# Patient Record
Sex: Male | Born: 1935 | Race: White | Hispanic: No | Marital: Married | Smoking: Former smoker
Health system: Southern US, Community
[De-identification: ages and names within clinical notes are randomized; demographics above are authoritative.]

## PROBLEM LIST (undated history)

## (undated) DIAGNOSIS — G5710 Meralgia paresthetica, unspecified lower limb: Secondary | ICD-10-CM

## (undated) DIAGNOSIS — Z8551 Personal history of malignant neoplasm of bladder: Secondary | ICD-10-CM

## (undated) DIAGNOSIS — Z8619 Personal history of other infectious and parasitic diseases: Secondary | ICD-10-CM

## (undated) DIAGNOSIS — Z87438 Personal history of other diseases of male genital organs: Secondary | ICD-10-CM

## (undated) DIAGNOSIS — I10 Essential (primary) hypertension: Secondary | ICD-10-CM

## (undated) DIAGNOSIS — K5730 Diverticulosis of large intestine without perforation or abscess without bleeding: Secondary | ICD-10-CM

## (undated) DIAGNOSIS — R413 Other amnesia: Secondary | ICD-10-CM

## (undated) DIAGNOSIS — I219 Acute myocardial infarction, unspecified: Secondary | ICD-10-CM

## (undated) DIAGNOSIS — M436 Torticollis: Secondary | ICD-10-CM

## (undated) DIAGNOSIS — I723 Aneurysm of iliac artery: Secondary | ICD-10-CM

## (undated) DIAGNOSIS — K219 Gastro-esophageal reflux disease without esophagitis: Secondary | ICD-10-CM

## (undated) DIAGNOSIS — G250 Essential tremor: Secondary | ICD-10-CM

## (undated) DIAGNOSIS — E785 Hyperlipidemia, unspecified: Secondary | ICD-10-CM

## (undated) DIAGNOSIS — K635 Polyp of colon: Secondary | ICD-10-CM

## (undated) DIAGNOSIS — I2510 Atherosclerotic heart disease of native coronary artery without angina pectoris: Secondary | ICD-10-CM

## (undated) DIAGNOSIS — R531 Weakness: Secondary | ICD-10-CM

## (undated) HISTORY — PX: TONSILLECTOMY: SUR1361

## (undated) HISTORY — DX: Meralgia paresthetica, unspecified lower limb: G57.10

## (undated) HISTORY — DX: Hyperlipidemia, unspecified: E78.5

## (undated) HISTORY — PX: CATARACT EXTRACTION W/ INTRAOCULAR LENS  IMPLANT, BILATERAL: SHX1307

## (undated) HISTORY — DX: Essential (primary) hypertension: I10

## (undated) HISTORY — DX: Diverticulosis of large intestine without perforation or abscess without bleeding: K57.30

## (undated) HISTORY — DX: Polyp of colon: K63.5

## (undated) HISTORY — DX: Torticollis: M43.6

---

## 2000-11-13 ENCOUNTER — Encounter: Payer: Self-pay | Admitting: Gastroenterology

## 2004-06-22 ENCOUNTER — Ambulatory Visit: Payer: Self-pay | Admitting: Internal Medicine

## 2004-07-15 ENCOUNTER — Ambulatory Visit: Payer: Self-pay | Admitting: Internal Medicine

## 2004-11-09 ENCOUNTER — Ambulatory Visit: Payer: Self-pay | Admitting: Internal Medicine

## 2004-11-25 ENCOUNTER — Ambulatory Visit: Payer: Self-pay | Admitting: Internal Medicine

## 2005-01-25 ENCOUNTER — Ambulatory Visit: Payer: Self-pay | Admitting: Internal Medicine

## 2005-08-21 HISTORY — PX: COLONOSCOPY: SHX174

## 2005-09-05 ENCOUNTER — Ambulatory Visit: Payer: Self-pay | Admitting: Internal Medicine

## 2005-09-07 ENCOUNTER — Ambulatory Visit: Payer: Self-pay | Admitting: Internal Medicine

## 2005-09-07 ENCOUNTER — Inpatient Hospital Stay (HOSPITAL_COMMUNITY): Admission: EM | Admit: 2005-09-07 | Discharge: 2005-09-10 | Payer: Self-pay | Admitting: Emergency Medicine

## 2005-09-14 ENCOUNTER — Ambulatory Visit: Payer: Self-pay | Admitting: Internal Medicine

## 2005-10-02 ENCOUNTER — Ambulatory Visit: Payer: Self-pay | Admitting: Internal Medicine

## 2005-10-20 ENCOUNTER — Ambulatory Visit: Payer: Self-pay | Admitting: Gastroenterology

## 2005-11-03 ENCOUNTER — Ambulatory Visit: Payer: Self-pay | Admitting: Gastroenterology

## 2005-11-03 ENCOUNTER — Encounter (INDEPENDENT_AMBULATORY_CARE_PROVIDER_SITE_OTHER): Payer: Self-pay | Admitting: *Deleted

## 2005-11-03 DIAGNOSIS — D126 Benign neoplasm of colon, unspecified: Secondary | ICD-10-CM

## 2006-02-08 ENCOUNTER — Ambulatory Visit: Payer: Self-pay | Admitting: Internal Medicine

## 2006-02-27 ENCOUNTER — Ambulatory Visit: Payer: Self-pay | Admitting: Internal Medicine

## 2006-05-09 ENCOUNTER — Ambulatory Visit: Payer: Self-pay | Admitting: Internal Medicine

## 2006-09-19 ENCOUNTER — Ambulatory Visit: Payer: Self-pay | Admitting: Internal Medicine

## 2006-10-07 DIAGNOSIS — E785 Hyperlipidemia, unspecified: Secondary | ICD-10-CM | POA: Insufficient documentation

## 2006-10-07 DIAGNOSIS — I10 Essential (primary) hypertension: Secondary | ICD-10-CM | POA: Insufficient documentation

## 2006-10-07 DIAGNOSIS — K5730 Diverticulosis of large intestine without perforation or abscess without bleeding: Secondary | ICD-10-CM | POA: Insufficient documentation

## 2006-11-05 ENCOUNTER — Ambulatory Visit: Payer: Self-pay | Admitting: Internal Medicine

## 2006-11-05 LAB — CONVERTED CEMR LAB
HDL: 42.6 mg/dL (ref >39.0)
Total CHOL/HDL Ratio: 3.1

## 2007-01-15 ENCOUNTER — Encounter: Payer: Self-pay | Admitting: Internal Medicine

## 2007-01-15 ENCOUNTER — Ambulatory Visit: Payer: Self-pay | Admitting: Internal Medicine

## 2007-01-15 LAB — CONVERTED CEMR LAB: Hemoglobin: 17.2 g/dL

## 2007-01-17 ENCOUNTER — Ambulatory Visit: Payer: Self-pay | Admitting: Gastroenterology

## 2007-02-07 ENCOUNTER — Telehealth (INDEPENDENT_AMBULATORY_CARE_PROVIDER_SITE_OTHER): Payer: Self-pay | Admitting: *Deleted

## 2007-05-28 ENCOUNTER — Telehealth (INDEPENDENT_AMBULATORY_CARE_PROVIDER_SITE_OTHER): Payer: Self-pay | Admitting: *Deleted

## 2007-06-04 ENCOUNTER — Ambulatory Visit: Payer: Self-pay | Admitting: Internal Medicine

## 2007-06-04 DIAGNOSIS — B3749 Other urogenital candidiasis: Secondary | ICD-10-CM | POA: Insufficient documentation

## 2007-06-26 ENCOUNTER — Inpatient Hospital Stay (HOSPITAL_COMMUNITY): Admission: EM | Admit: 2007-06-26 | Discharge: 2007-06-30 | Payer: Self-pay | Admitting: Emergency Medicine

## 2007-06-26 ENCOUNTER — Ambulatory Visit: Payer: Self-pay | Admitting: Vascular Surgery

## 2007-06-27 ENCOUNTER — Encounter: Payer: Self-pay | Admitting: Internal Medicine

## 2007-06-27 ENCOUNTER — Encounter (INDEPENDENT_AMBULATORY_CARE_PROVIDER_SITE_OTHER): Payer: Self-pay | Admitting: General Surgery

## 2007-06-27 HISTORY — PX: LAPAROSCOPIC CHOLECYSTECTOMY: SUR755

## 2007-06-28 ENCOUNTER — Ambulatory Visit: Payer: Self-pay | Admitting: Internal Medicine

## 2007-07-09 ENCOUNTER — Ambulatory Visit: Payer: Self-pay | Admitting: Internal Medicine

## 2007-08-02 ENCOUNTER — Encounter: Payer: Self-pay | Admitting: Internal Medicine

## 2007-08-12 ENCOUNTER — Ambulatory Visit: Payer: Self-pay | Admitting: Internal Medicine

## 2007-08-12 DIAGNOSIS — G250 Essential tremor: Secondary | ICD-10-CM | POA: Insufficient documentation

## 2007-08-12 DIAGNOSIS — G252 Other specified forms of tremor: Secondary | ICD-10-CM

## 2007-08-12 DIAGNOSIS — R413 Other amnesia: Secondary | ICD-10-CM | POA: Insufficient documentation

## 2007-08-12 LAB — CONVERTED CEMR LAB

## 2007-08-19 ENCOUNTER — Encounter (INDEPENDENT_AMBULATORY_CARE_PROVIDER_SITE_OTHER): Payer: Self-pay | Admitting: *Deleted

## 2007-09-24 ENCOUNTER — Encounter: Payer: Self-pay | Admitting: Internal Medicine

## 2007-09-24 ENCOUNTER — Ambulatory Visit: Payer: Self-pay | Admitting: Vascular Surgery

## 2008-02-04 ENCOUNTER — Ambulatory Visit: Payer: Self-pay | Admitting: Internal Medicine

## 2008-02-18 ENCOUNTER — Encounter (INDEPENDENT_AMBULATORY_CARE_PROVIDER_SITE_OTHER): Payer: Self-pay | Admitting: *Deleted

## 2008-02-18 LAB — CONVERTED CEMR LAB: Vitamin B-12: 617 pg/mL (ref 211–911)

## 2008-03-16 ENCOUNTER — Ambulatory Visit: Payer: Self-pay | Admitting: Internal Medicine

## 2008-03-16 ENCOUNTER — Encounter (INDEPENDENT_AMBULATORY_CARE_PROVIDER_SITE_OTHER): Payer: Self-pay | Admitting: *Deleted

## 2008-03-23 ENCOUNTER — Encounter (INDEPENDENT_AMBULATORY_CARE_PROVIDER_SITE_OTHER): Payer: Self-pay | Admitting: *Deleted

## 2008-03-24 ENCOUNTER — Encounter: Payer: Self-pay | Admitting: Internal Medicine

## 2008-03-24 ENCOUNTER — Ambulatory Visit: Payer: Self-pay

## 2008-03-27 ENCOUNTER — Encounter (INDEPENDENT_AMBULATORY_CARE_PROVIDER_SITE_OTHER): Payer: Self-pay | Admitting: *Deleted

## 2008-03-31 ENCOUNTER — Encounter: Payer: Self-pay | Admitting: Internal Medicine

## 2008-03-31 ENCOUNTER — Ambulatory Visit: Payer: Self-pay | Admitting: Vascular Surgery

## 2008-03-31 ENCOUNTER — Telehealth (INDEPENDENT_AMBULATORY_CARE_PROVIDER_SITE_OTHER): Payer: Self-pay | Admitting: *Deleted

## 2008-03-31 DIAGNOSIS — I6529 Occlusion and stenosis of unspecified carotid artery: Secondary | ICD-10-CM

## 2008-04-20 ENCOUNTER — Encounter: Payer: Self-pay | Admitting: Internal Medicine

## 2008-06-18 ENCOUNTER — Telehealth (INDEPENDENT_AMBULATORY_CARE_PROVIDER_SITE_OTHER): Payer: Self-pay | Admitting: *Deleted

## 2008-09-29 ENCOUNTER — Ambulatory Visit: Payer: Self-pay

## 2008-09-29 ENCOUNTER — Encounter: Payer: Self-pay | Admitting: Internal Medicine

## 2008-10-06 ENCOUNTER — Ambulatory Visit: Payer: Self-pay | Admitting: Internal Medicine

## 2008-10-06 DIAGNOSIS — K219 Gastro-esophageal reflux disease without esophagitis: Secondary | ICD-10-CM

## 2008-10-06 DIAGNOSIS — J309 Allergic rhinitis, unspecified: Secondary | ICD-10-CM | POA: Insufficient documentation

## 2008-10-22 ENCOUNTER — Telehealth (INDEPENDENT_AMBULATORY_CARE_PROVIDER_SITE_OTHER): Payer: Self-pay | Admitting: *Deleted

## 2008-10-27 ENCOUNTER — Telehealth (INDEPENDENT_AMBULATORY_CARE_PROVIDER_SITE_OTHER): Payer: Self-pay | Admitting: *Deleted

## 2008-11-04 ENCOUNTER — Ambulatory Visit: Payer: Self-pay | Admitting: Internal Medicine

## 2008-11-16 ENCOUNTER — Encounter (INDEPENDENT_AMBULATORY_CARE_PROVIDER_SITE_OTHER): Payer: Self-pay | Admitting: *Deleted

## 2008-11-16 LAB — CONVERTED CEMR LAB
AST: 22 units/L (ref 0–37)
Bilirubin, Direct: 0 mg/dL (ref 0.0–0.3)
Glucose, Bld: 109 mg/dL — ABNORMAL HIGH (ref 70–99)
Triglycerides: 121 mg/dL (ref 0.0–149.0)

## 2008-11-24 ENCOUNTER — Ambulatory Visit: Payer: Self-pay | Admitting: Internal Medicine

## 2008-11-24 DIAGNOSIS — R7309 Other abnormal glucose: Secondary | ICD-10-CM

## 2008-12-24 ENCOUNTER — Encounter: Payer: Self-pay | Admitting: Internal Medicine

## 2009-01-20 ENCOUNTER — Telehealth: Payer: Self-pay | Admitting: Internal Medicine

## 2009-02-17 ENCOUNTER — Telehealth (INDEPENDENT_AMBULATORY_CARE_PROVIDER_SITE_OTHER): Payer: Self-pay | Admitting: *Deleted

## 2009-02-24 ENCOUNTER — Ambulatory Visit: Payer: Self-pay | Admitting: Internal Medicine

## 2009-02-28 ENCOUNTER — Emergency Department (HOSPITAL_COMMUNITY): Admission: EM | Admit: 2009-02-28 | Discharge: 2009-02-28 | Payer: Self-pay | Admitting: Emergency Medicine

## 2009-03-07 LAB — CONVERTED CEMR LAB
ALT: 11 units/L (ref 0–53)
AST: 24 units/L (ref 0–37)
BUN: 13 mg/dL (ref 6–23)
Cholesterol: 145 mg/dL (ref 0–200)
Creatinine, Ser: 1.1 mg/dL (ref 0.4–1.5)
HDL: 40.9 mg/dL (ref >39.00)
Triglycerides: 112 mg/dL (ref 0.0–149.0)
VLDL: 22.4 mg/dL (ref 0.0–40.0)

## 2009-03-08 ENCOUNTER — Encounter (INDEPENDENT_AMBULATORY_CARE_PROVIDER_SITE_OTHER): Payer: Self-pay | Admitting: *Deleted

## 2009-03-09 ENCOUNTER — Telehealth (INDEPENDENT_AMBULATORY_CARE_PROVIDER_SITE_OTHER): Payer: Self-pay | Admitting: *Deleted

## 2009-03-25 ENCOUNTER — Telehealth (INDEPENDENT_AMBULATORY_CARE_PROVIDER_SITE_OTHER): Payer: Self-pay | Admitting: *Deleted

## 2009-04-02 ENCOUNTER — Ambulatory Visit: Payer: Self-pay | Admitting: Cardiovascular Disease

## 2009-06-03 ENCOUNTER — Ambulatory Visit: Payer: Self-pay | Admitting: Internal Medicine

## 2009-06-03 ENCOUNTER — Encounter: Payer: Self-pay | Admitting: Family Medicine

## 2009-06-03 DIAGNOSIS — N401 Enlarged prostate with lower urinary tract symptoms: Secondary | ICD-10-CM

## 2009-06-03 LAB — CONVERTED CEMR LAB
Bilirubin Urine: NEGATIVE
Glucose, Urine, Semiquant: NEGATIVE
Ketones, urine, test strip: NEGATIVE
pH: 6

## 2009-06-08 ENCOUNTER — Encounter: Payer: Self-pay | Admitting: Family Medicine

## 2009-10-27 ENCOUNTER — Ambulatory Visit: Payer: Self-pay | Admitting: Internal Medicine

## 2009-10-27 DIAGNOSIS — H8110 Benign paroxysmal vertigo, unspecified ear: Secondary | ICD-10-CM | POA: Insufficient documentation

## 2009-10-27 DIAGNOSIS — M436 Torticollis: Secondary | ICD-10-CM | POA: Insufficient documentation

## 2009-10-27 DIAGNOSIS — I951 Orthostatic hypotension: Secondary | ICD-10-CM | POA: Insufficient documentation

## 2009-10-27 LAB — CONVERTED CEMR LAB
Eosinophils Absolute: 0.1 10*3/uL (ref 0.0–0.7)
MCHC: 32.8 g/dL (ref 30.0–36.0)
MCV: 100.1 fL — ABNORMAL HIGH (ref 78.0–100.0)
Monocytes Relative: 7.7 % (ref 3.0–12.0)
Neutro Abs: 3.7 10*3/uL (ref 1.4–7.7)
Neutrophils Relative %: 50.3 % (ref 43.0–77.0)
Platelets: 206 10*3/uL (ref 150.0–400.0)
RBC: 5.02 M/uL (ref 4.22–5.81)
RDW: 13.2 % (ref 11.5–14.6)

## 2009-11-25 ENCOUNTER — Encounter: Payer: Self-pay | Admitting: Internal Medicine

## 2009-12-04 ENCOUNTER — Emergency Department (HOSPITAL_COMMUNITY): Admission: EM | Admit: 2009-12-04 | Discharge: 2009-12-05 | Payer: Self-pay | Admitting: Emergency Medicine

## 2009-12-06 ENCOUNTER — Telehealth: Payer: Self-pay | Admitting: Internal Medicine

## 2009-12-16 ENCOUNTER — Encounter: Payer: Self-pay | Admitting: Internal Medicine

## 2009-12-30 ENCOUNTER — Ambulatory Visit: Payer: Self-pay | Admitting: Internal Medicine

## 2009-12-30 DIAGNOSIS — R350 Frequency of micturition: Secondary | ICD-10-CM

## 2009-12-30 LAB — CONVERTED CEMR LAB
Nitrite: NEGATIVE
Protein, U semiquant: NEGATIVE
Urobilinogen, UA: 0.2

## 2009-12-31 ENCOUNTER — Encounter: Payer: Self-pay | Admitting: Internal Medicine

## 2010-01-27 ENCOUNTER — Telehealth (INDEPENDENT_AMBULATORY_CARE_PROVIDER_SITE_OTHER): Payer: Self-pay | Admitting: *Deleted

## 2010-03-22 ENCOUNTER — Ambulatory Visit: Payer: Self-pay | Admitting: Internal Medicine

## 2010-03-22 DIAGNOSIS — R002 Palpitations: Secondary | ICD-10-CM

## 2010-03-22 DIAGNOSIS — R609 Edema, unspecified: Secondary | ICD-10-CM

## 2010-04-17 ENCOUNTER — Emergency Department (HOSPITAL_COMMUNITY): Admission: EM | Admit: 2010-04-17 | Discharge: 2010-04-17 | Payer: Self-pay | Admitting: Emergency Medicine

## 2010-04-19 ENCOUNTER — Encounter: Payer: Self-pay | Admitting: Internal Medicine

## 2010-05-06 ENCOUNTER — Encounter: Payer: Self-pay | Admitting: Internal Medicine

## 2010-05-24 ENCOUNTER — Telehealth (INDEPENDENT_AMBULATORY_CARE_PROVIDER_SITE_OTHER): Payer: Self-pay | Admitting: *Deleted

## 2010-07-12 ENCOUNTER — Encounter: Payer: Self-pay | Admitting: Internal Medicine

## 2010-07-13 ENCOUNTER — Ambulatory Visit: Payer: Self-pay | Admitting: Internal Medicine

## 2010-07-13 DIAGNOSIS — R1312 Dysphagia, oropharyngeal phase: Secondary | ICD-10-CM

## 2010-07-13 DIAGNOSIS — J029 Acute pharyngitis, unspecified: Secondary | ICD-10-CM

## 2010-07-18 LAB — CONVERTED CEMR LAB: PSA: 0.13 ng/mL (ref <=4.00)

## 2010-08-03 ENCOUNTER — Encounter: Payer: Self-pay | Admitting: Internal Medicine

## 2010-09-18 LAB — CONVERTED CEMR LAB
BUN: 10 mg/dL (ref 6–23)
BUN: 11 mg/dL (ref 6–23)
Basophils Absolute: 0 10*3/uL (ref 0.0–0.1)
Basophils Relative: 0.7 % (ref 0.0–3.0)
Calcium: 9.4 mg/dL (ref 8.4–10.5)
Creatinine, Ser: 1 mg/dL (ref 0.4–1.5)
Eosinophils Relative: 4.5 % (ref 0.0–5.0)
HCT: 48.9 % (ref 39.0–52.0)
HCT: 49.6 % (ref 39.0–52.0)
Lymphocytes Relative: 34.8 % (ref 12.0–46.0)
Lymphs Abs: 3.2 10*3/uL (ref 0.7–4.0)
MCHC: 33.7 g/dL (ref 30.0–36.0)
MCV: 96.9 fL (ref 78.0–100.0)
MCV: 98 fL (ref 78.0–100.0)
Monocytes Absolute: 1.3 10*3/uL — ABNORMAL HIGH (ref 0.1–1.0)
Monocytes Relative: 18.5 % — ABNORMAL HIGH (ref 3.0–12.0)
Neutro Abs: 1.6 10*3/uL (ref 1.4–7.7)
Platelets: 207 10*3/uL (ref 150–400)
Platelets: 215 10*3/uL (ref 150.0–400.0)
Potassium: 3.9 meq/L (ref 3.5–5.1)
Potassium: 4.6 meq/L (ref 3.5–5.1)
RBC: 4.99 M/uL (ref 4.22–5.81)
RDW: 13.9 % (ref 11.5–14.6)
WBC: 9.3 10*3/uL (ref 4.5–10.5)

## 2010-09-20 NOTE — Assessment & Plan Note (Signed)
Summary: sinus - blood when blowing nose/cbs   Vital Signs:  Patient profile:   77 year old male Weight:      202.8 pounds Temp:     98.5 degrees F oral Pulse rate:   72 / minute Resp:     17 per minute BP sitting:   124 / 70  (left arm) Cuff size:   large  Vitals Entered By: Shonna Chock CMA (March 22, 2010 12:01 PM) CC: 1.) Blood when blowing nose x 2 weeks (mostly in am)  2.) Lower leg swelling  3.) Funny felling in chest x 2, Palpitations   CC:  1.) Blood when blowing nose x 2 weeks (mostly in am)  2.) Lower leg swelling  3.) Funny felling in chest x 2 and Palpitations.  History of Present Illness:  Palpitations      This is an 77 year old man who presents with palpitations twice in past month.  The patient denies dizziness, presyncope, syncope, chest pain, and shortness of breath.  The patient denies the following symptoms: blurred vision, numbness, weakness, diaphoresis, nausea, weight loss, and heat intolerance.  The palpitations are described as a sensation of rapid heart fluttering.  The palpitations are sudden in onset, intermittent,  occur at rest  at night, "when I had restles night".He d                                                       Blood from nose  , "emptying out from L side" intermittently, "most every morning X 2 weeks".dr Sandria Manly Rxed plavix.  Current Medications (verified): 1)  Diovan  320 Mg  Tabs (Valsartan) .... 1/2  By Mouth Once Daily 2)  Metoprolol Tartrate 50 Mg  Tabs (Metoprolol Tartrate) .... 1/2 Tab Bid 3)  Tylenol .Marland Kitchen.. 1 By Mouth Prn 4)  Plavix 75 Mg Tabs (Clopidogrel Bisulfate) .Marland Kitchen.. 1 By Mouth Once Daily 5)  Fish Oil 1200 Mg Caps (Omega-3 Fatty Acids) .... Two Times A Day 6)  Fiber .... Two Times A Day 7)  Ranitidine Hcl 150 Mg Tabs (Ranitidine Hcl) .Marland Kitchen.. 1 Two Times A Day Pre Meals 8)  Pravastatin Sodium 40 Mg Tabs (Pravastatin Sodium) .Marland Kitchen.. 1 At Bedtime 9)  Finasteride 5 Mg Tabs (Finasteride) .Marland Kitchen.. 1 Once Daily 10)  Tamsulosin Hcl 0.4 Mg Caps  (Tamsulosin Hcl) .Marland Kitchen.. 1 Once Daily ; Monitor Bp Closely  Allergies: 1)  ! * Altace 2)  ! * Hctz 3)  ! * Ranitidine 4)  ! * Saw Palmetto 5)  ! Flomax 6)  ! * Neurotin 7)  ! * Spe  Review of Systems General:  Denies chills, fever, and sweats. ENT:  No facial pain , frontal headache or purulence. CV:  Complains of swelling of feet; denies difficulty breathing at night, difficulty breathing while lying down, and swelling of hands; Edema resolves overnight. Resp:  Complains of cough; denies chest pain with inspiration and coughing up blood. GI:  Denies bloody stools and dark tarry stools. GU:  Denies hematuria. Heme:  Denies abnormal bruising.  Physical Exam  General:  Appears younger than age,in no acute distress; alert,appropriate and cooperative throughout examination Eyes:  No lid laf Ears:  External ear exam shows no significant lesions or deformities.  Otoscopic examination reveals clear canals, tympanic membranes are intact bilaterally without bulging,  retraction, inflammation or discharge. Hearing is grossly normal bilaterally. Nose:  External nasal examination shows no deformity or inflammation. Nasal mucosa are  dry without lesions or exudates. Mouth:  Oral mucosa and oropharynx without lesions or exudates.  Teeth in good repair. Neck:  No deformities, masses, or tenderness noted. Lungs:  Normal respiratory effort, chest expands symmetrically. Lungs are clear to auscultation, no crackles or wheezes. Heart:  regular rhythm, no murmur, no gallop, no rub, no JVD, and bradycardia.   Extremities:  No clubbing, cyanosis.trace left pedal edema and trace right pedal edema.   Neurologic:  alert & oriented X3 and DTRs symmetrical and normal.   fine tremor LUE > RUE  Skin:  Intact without suspicious lesions or rashes Cervical Nodes:  No lymphadenopathy noted Axillary Nodes:  No palpable lymphadenopathy Psych:  depressed affect and flat affect. Somewhat argumentative ("It ain't nosebleed,  it's coming fom my sinuses")     Impression & Recommendations:  Problem # 1:  PALPITATIONS (ICD-785.1)  His updated medication list for this problem includes:    Metoprolol Tartrate 50 Mg Tabs (Metoprolol tartrate) .Marland Kitchen... 1/2 tab bid  Orders: Venipuncture (52841) TLB-TSH (Thyroid Stimulating Hormone) (84443-TSH) TLB-Potassium (K+) (84132-K) TLB-Calcium (82310-CA) TLB-Magnesium (Mg) (83735-MG) EKG w/ Interpretation (93000)  Problem # 2:  EPISTAXIS (ICD-784.7)  probably from drying & Plavix  Orders: Venipuncture (32440) TLB-CBC Platelet - w/Differential (85025-CBCD)  Problem # 3:  EDEMA- LOCALIZED (ICD-782.3)  trace  Orders: TLB-Creatinine, Blood (82565-CREA) TLB-BUN (Urea Nitrogen) (84520-BUN)  Complete Medication List: 1)  Diovan 320 Mg Tabs (valsartan)  .... 1/2  by mouth once daily 2)  Metoprolol Tartrate 50 Mg Tabs (Metoprolol tartrate) .... 1/2 tab bid 3)  Tylenol  .Marland Kitchen.. 1 by mouth prn 4)  Plavix 75 Mg Tabs (Clopidogrel bisulfate) .Marland Kitchen.. 1 by mouth once daily 5)  Fish Oil 1200 Mg Caps (Omega-3 fatty acids) .... Two times a day 6)  Fiber  .... Two times a day 7)  Ranitidine Hcl 150 Mg Tabs (Ranitidine hcl) .Marland Kitchen.. 1 two times a day pre meals 8)  Pravastatin Sodium 40 Mg Tabs (Pravastatin sodium) .Marland Kitchen.. 1 at bedtime 9)  Finasteride 5 Mg Tabs (Finasteride) .Marland Kitchen.. 1 once daily 10)  Tamsulosin Hcl 0.4 Mg Caps (Tamsulosin hcl) .Marland Kitchen.. 1 once daily ; monitor bp closely  Patient Instructions: 1)  Eucerin topically two times a day to nasal septum . If bleeding continues , ENT referral indicated.Avoid stimulantsas discussed. 2)  Limit your Sodium (Salt) to less than 4 grams a day (slightly less than 1 teaspoon) to prevent fluid retention, swelling, or worsening or symptoms.

## 2010-09-20 NOTE — Letter (Signed)
Summary: [facility]  [facility]   Imported By: Lanelle Bal 05/16/2010 12:32:45  _____________________________________________________________________  External Attachment:    Type:   Image     Comment:   External Document

## 2010-09-20 NOTE — Progress Notes (Signed)
Summary: finasteride refill   Phone Note Refill Request Message from:  Fax from Pharmacy on May 24, 2010 8:56 AM  Refills Requested: Medication #1:  FINASTERIDE 5 MG TABS 1 once daily Ivar Bury - fax [phone]  Initial call taken by: Okey Regal [place],  May 24, 2010 8:56 AM    Prescriptions: FINASTERIDE 5 MG TABS (FINASTERIDE) 1 once daily  #30 Tablet x 4   Entered by:   Doristine Devoid CMA   Authorized by:   Marga Melnick MD   Signed by:   Doristine Devoid CMA on 05/24/2010   Method used:   [facility]        [phone][facility] (retail[facility]       [address]       [place], [place]  [address]       Ph: [phone]       Fax: [phone]   RxID:   [id number]

## 2010-09-20 NOTE — Letter (Signed)
Summary: [facility]  [facility]   Imported By: Lanelle Bal 05/03/2010 09:31:10  _____________________________________________________________________  External Attachment:    Type:   Image     Comment:   External Document

## 2010-09-20 NOTE — Assessment & Plan Note (Signed)
Summary: sore throat///sph   Vital Signs:  Patient profile:   77 year old male Weight:      202.4 pounds Temp:     98.7 degrees F oral Pulse rate:   72 / minute Resp:     16 per minute BP sitting:   118 / 64  (left arm) Cuff size:   large  Vitals Entered By: Shonna Chock CMA (July 13, 2010 2:01 PM) CC: Patient had a sore throat, onset last Friday- sore throat resolved. Patient dry month x long time, patient also needs ordeer for labs (near future) , URI symptoms   CC:  Patient had a sore throat, onset last Friday- sore throat resolved. Patient dry month x long time, patient also needs ordeer for labs (near future) , and URI symptoms.  History of Present Illness:      This is an 77 year old man who presents with URI symptoms as ST 11/18- 11/22. This resolved with OTC drops. The patient reports sore throat, but denies nasal congestion, purulent nasal discharge, productive cough, and earache.  The patient denies fever, dyspnea, and wheezing.  The patient denies headache or bilateral facial pain, tooth pain, and tender adenopathy.  Some dysphagia  when  collection of pills  taken or with mashed potatoes. He has chronically dry mouth , but not xerostomia.  Current Medications (verified): 1)  Diovan  320 Mg  Tabs (Valsartan) .... 1/2  By Mouth Once Daily 2)  Metoprolol Tartrate 50 Mg  Tabs (Metoprolol Tartrate) .... 1/2 Tab Bid 3)  Tylenol .Marland Kitchen.. 1 By Mouth Prn 4)  Fish Oil 1200 Mg Caps (Omega-3 Fatty Acids) .... Two Times A Day 5)  Fiber .... Two Times A Day 6)  Ranitidine Hcl 150 Mg Tabs (Ranitidine Hcl) .Marland Kitchen.. 1 Two Times A Day Pre Meals 7)  Pravastatin Sodium 40 Mg Tabs (Pravastatin Sodium) .Marland Kitchen.. 1 At Bedtime **labs Due** 8)  Finasteride 5 Mg Tabs (Finasteride) .Marland Kitchen.. 1 Once Daily  Allergies: 1)  ! * Altace 2)  ! * Hctz 3)  ! * Ranitidine 4)  ! * Saw Palmetto 5)  ! Flomax 6)  ! * Neurotin 7)  ! * Spe  Review of Systems Allergy:  Denies itching eyes and sneezing; No angioedema  .  Physical Exam  General:  in no acute distress; alert,appropriate and cooperative throughout examination Ears:  External ear exam shows no significant lesions or deformities.  Otoscopic examination reveals clear canals, tympanic membranes are intact bilaterally without bulging, retraction, inflammation or discharge. Hearing is grossly normal bilaterally. Nose:  External nasal examination shows no deformity or inflammation. Nasal mucosa are pink and moist without lesions or exudates. Mouth:  Oral mucosa and oropharynx without lesions or exudates.  Teeth in good repair.Tongue not dry Lungs:  Normal respiratory effort, chest expands symmetrically. Lungs are clear to auscultation, no crackles or wheezes. Cervical Nodes:  No lymphadenopathy noted Axillary Nodes:  No palpable lymphadenopathy   Impression & Recommendations:  Problem # 1:  SORE THROAT (ICD-462) resolved, ? related to ERD  Problem # 2:  DYSPHAGIA, OROPHARYNGEAL PHASE (ZOX-096.04) especially with large # of pills  Problem # 3:  HYPERTROPHY PROSTATE W/UR OBST & OTH LUTS (ICD-600.01)  The following medications were removed from the medication list:    Tamsulosin Hcl 0.4 Mg Caps (Tamsulosin hcl) .Marland Kitchen... 1 once daily ; monitor bp closely His updated medication list for this problem includes:    Finasteride 5 Mg Tabs (Finasteride) .Marland Kitchen... 1 once daily  Orders:  Venipuncture (84132) TLB-PSA (Prostate Specific Antigen) (84153-PSA)  Complete Medication List: 1)  Diovan 320 Mg Tabs (valsartan)  .... 1/2  by mouth once daily 2)  Metoprolol Tartrate 50 Mg Tabs (Metoprolol tartrate) .... 1/2 tab bid 3)  Tylenol  .Marland Kitchen.. 1 by mouth prn 4)  Fish Oil 1200 Mg Caps (Omega-3 fatty acids) .... Two times a day 5)  Fiber  .... Two times a day 6)  Ranitidine Hcl 150 Mg Tabs (Ranitidine hcl) .Marland Kitchen.. 1 two times a day pre meals 7)  Pravastatin Sodium 40 Mg Tabs (Pravastatin sodium) .Marland Kitchen.. 1 at bedtime 8)  Finasteride 5 Mg Tabs (Finasteride) .Marland Kitchen.. 1 once  daily  Patient Instructions: 1)  Trial of Omeprazole ( Prilosec OTC)  in place of two times a day Ranitidine.  2)  Avoid foods high in acid (tomatoes, citrus juices, spicy foods). Avoid eating within two hours of lying down or before exercising. Do not over eat; try smaller more frequent meals. Elevate head of bed twelve inches when sleeping. Divide pills into 2 separate batches / day or take them 1 pill @ a time. Prescriptions: PRAVASTATIN SODIUM 40 MG TABS (PRAVASTATIN SODIUM) 1 at bedtime  #90 x 1   Entered and Authorized by:   Marga Melnick MD   Signed by:   Marga Melnick MD on 07/13/2010   Method used:   Print then Give to Patient   RxID:   [id number] FINASTERIDE 5 MG TABS (FINASTERIDE) 1 once daily  #90 x 1   Entered and Authorized by:   Marga Melnick MD   Signed by:   Marga Melnick MD on 07/13/2010   Method used:   Print then Give to Patient   RxID:   [id number]    Orders Added: 1)  Est. Patient Level III [29518] 2)  Venipuncture [84166] 3)  TLB-PSA (Prostate Specific Antigen) [06301-SWF]

## 2010-09-20 NOTE — Consult Note (Signed)
Summary: [facility]  [facility]   Imported By: Lanelle Bal 12/01/2009 10:48:00  _____________________________________________________________________  External Attachment:    Type:   Image     Comment:   External Document

## 2010-09-20 NOTE — Letter (Signed)
Summary: [facility]  [facility]   Imported By: Lanelle Bal 12/24/2009 11:23:27  _____________________________________________________________________  External Attachment:    Type:   Image     Comment:   External Document

## 2010-09-20 NOTE — Progress Notes (Signed)
Summary: Pharmacy Refill Request  Phone Note From Pharmacy Message from:  Pharmacy on January 27, 2010 10:42 AM  [facility] [phone]* Summary of Call: Requesting refill for Tamsulosin HCL 0.4 MG. Insurance requires 90 day supply. Please resend new RX. Initial call taken by: Harold Barban,  January 27, 2010 10:43 AM    Prescriptions: TAMSULOSIN HCL 0.4 MG CAPS (TAMSULOSIN HCL) 1 once daily ; monitor BP closely  #90 x 1   Entered by:   Shonna Chock   Authorized by:   Marga Melnick MD   Signed by:   Shonna Chock on 01/27/2010   Method used:   Electronically to        [facility] [phone]* (retail)       [address]       [place], [place]  [address]       Ph: [phone]       Fax: [phone]   RxID:   [id number]

## 2010-09-20 NOTE — Assessment & Plan Note (Signed)
Summary: frequent urination/cbs   Vital Signs:  Patient profile:   77 year old male Weight:      201 pounds Temp:     98.5 degrees F oral Pulse rate:   64 / minute Resp:     17 per minute BP sitting:   150 / 80  (left arm) Cuff size:   large  Vitals Entered By: Shonna Chock (Dec 30, 2009 12:12 PM) CC: Frequent Urination Comments REVIEWED MED LIST, PATIENT AGREED DOSE AND INSTRUCTION CORRECT    CC:  Frequent Urination.  History of Present Illness: Frequency as 12/29/2009 which was associated with hourly nocturia.  ER records 04/17 reviewed : Levaquin, Flomax, & Pyridium Rxd. Dr Belva Crome 04/28 OV reviewed ; normal WNL but 2+ BPH.  Allergies: 1)  ! * Altace 2)  ! * Hctz 3)  ! * Ranitidine 4)  ! * Saw Palmetto 5)  ! Flomax 6)  ! * Neurotin 7)  ! * Spe  Review of Systems General:  Denies chills and fever; Some night sweats occasionally. GU:  Denies discharge, dysuria, hematuria, and urinary hesitancy.  Physical Exam  General:  in no acute distress; alert,appropriate and cooperative throughout examination; appears younger than age Abdomen:  Bowel sounds positive,abdomen soft  but slightly tender suprapubic area  without  distention ,masses, organomegaly or hernias noted. Msk:  No flank tenderneshelps; he sat up w/o  Neurologic:  strength normal in  lower  extremities.   Skin:  Intact without suspicious lesions or rashes Cervical Nodes:  No lymphadenopathy noted Axillary Nodes:  No palpable lymphadenopathy Psych:  memory intact for recent and remote, normally interactive, and good eye contact.     Impression & Recommendations:  Problem # 1:  URINARY FREQUENCY (ICD-788.41) with microscopic hematuria, C&S pending His updated medication list for this problem includes:    Finasteride 5 Mg Tabs (Finasteride) .Marland Kitchen... 1 once daily    Tamsulosin Hcl 0.4 Mg Caps (Tamsulosin hcl) .Marland Kitchen... 1 once daily ; monitor bp closely  Orders: UA Dipstick w/o Micro (manual) (16109) T-Culture,  Urine ([id number]) Prescription Created Electronically [phone])  Problem # 2:  HYPERTROPHY PROSTATE W/UR OBST & OTH LUTS (ICD-600.01) as per Dr Annabell Howells His updated medication list for this problem includes:    Finasteride 5 Mg Tabs (Finasteride) .Marland Kitchen... 1 once daily    Tamsulosin Hcl 0.4 Mg Caps (Tamsulosin hcl) .Marland Kitchen... 1 once daily ; monitor bp closely  Complete Medication List: 1)  Diovan 320 Mg Tabs (valsartan)  .... 1/2  by mouth once daily 2)  Metoprolol Tartrate 50 Mg Tabs (Metoprolol tartrate) .... 1/2 tab bid 3)  Tylenol  .Marland Kitchen.. 1 by mouth prn 4)  Plavix 75 Mg Tabs (Clopidogrel bisulfate) .Marland Kitchen.. 1 by mouth once daily 5)  Fish Oil 1200 Mg Caps (Omega-3 fatty acids) .... Two times a day 6)  Fiber  .... Two times a day 7)  Ranitidine Hcl 150 Mg Tabs (Ranitidine hcl) .Marland Kitchen.. 1 two times a day pre meals 8)  Pravastatin Sodium 40 Mg Tabs (Pravastatin sodium) .Marland Kitchen.. 1 at bedtime 9)  Finasteride 5 Mg Tabs (Finasteride) .Marland Kitchen.. 1 once daily 10)  Tamsulosin Hcl 0.4 Mg Caps (Tamsulosin hcl) .Marland Kitchen.. 1 once daily ; monitor bp closely 11)  Ciprofloxacin Hcl 500 Mg Tabs (Ciprofloxacin hcl) .Marland Kitchen.. 1 two times a day  Patient Instructions: 1)  Drink as much fluid as you can tolerate for the next few days. Avoid spicey foods & alcohol. Prescriptions: CIPROFLOXACIN HCL 500 MG TABS (CIPROFLOXACIN HCL) 1 two times  a day  #14 x 0   Entered and Authorized by:   Marga Melnick MD   Signed by:   Marga Melnick MD on 12/30/2009   Method used:   Faxed to ...       [facility] [phone]* (retail)       [address]       [place], [place]  [address]       Ph: [phone]       Fax: [phone]   RxID:   [id number] TAMSULOSIN HCL 0.4 MG CAPS (TAMSULOSIN HCL) 1 once daily ; monitor BP closely  #30 x 5   Entered and Authorized by:   Marga Melnick MD   Signed by:   Marga Melnick MD on 12/30/2009   Method used:   Faxed to ...       [facility] [phone]* (retail)       [address]       [place], [place]  [address]       Ph: [phone]       Fax: [phone]   RxID:   [id number]   Laboratory Results   Urine Tests    Routine Urinalysis   Color: yellow Appearance: Clear Glucose: negative   (Normal Range: Negative) Bilirubin: negative   (Normal Range: Negative) Ketone: negative   (Normal Range: Negative) Spec. Gravity: <1.005   (Normal Range: 1.003-1.035) Blood: trace-lysed   (Normal Range: Negative) pH: 7.0   (Normal Range: 5.0-8.0) Protein: negative   (Normal Range: Negative) Urobilinogen: 0.2   (Normal Range: 0-1) Nitrite: negative   (Normal Range: Negative) Leukocyte Esterace: negative   (Normal Range: Negative)

## 2010-09-20 NOTE — Progress Notes (Signed)
Summary: [facility]  Phone Note Outgoing Call   Summary of Call: [facility] Triage Call Report Triage Record Num: [id number] Operator: Caswell Corwin Patient Name: Romulo Okray Call Date & Time: 12/04/2009 9:36:36PM Patient Phone: [phone] PCP: Marga Melnick Patient Gender: Male PCP Fax : Patient DOB: 10/25/1925 Practice Name: [facility] Reason for Call: Pt calling that he is having dribbling and urinary pain and frequency. Sx started this AM Q 1000. AFebrile. Triaged uriinary sx and hs not been able to empty bladder and it feels very full. Inst to go to E/R. Protocol(s) Used: Urinary Symptoms / Prostate Problems Recommended Outcome per Protocol: See Provider within 4 hours Reason for Outcome: No urination for 8 hours or more Care Advice:  ~ Another adult should drive.  ~ List, or take, all current prescription(s), OTC or alternative medication(s) to provider for evaluation.  ~ Tell provider medical history of renal disease; especially if have only one kidney.  ~ Limit fluid intake to sips, not to exceed eight ounces per hour.  ~ Call provider immediately if having severe pain from the retention of urine. 04  Follow-up for Phone Call        PT WIFE STATES THAT PT IS DOING ALOT BETTER TODAY. PT WENT TO ED FOR SYMPTOMS AND WAS RX A MED THAT HAS SEEMED TO HELP PER WIFE..........Marland KitchenFelecia Deloach CMA  December 06, 2009 8:27 AM  Follow-up by: Marga Melnick MD,  December 06, 2009 1:16 PM

## 2010-09-20 NOTE — Assessment & Plan Note (Signed)
Summary: dizzy/kdc   Vital Signs:  Patient profile:   77 year old male Weight:      200.6 pounds O2 Sat:      95 % Temp:     98.4 degrees F oral Pulse rate:   67 / minute Resp:     17 per minute BP supine:   150 / 88  (left arm) BP sitting:   134 / 88  (left arm) BP standing:   126 / 80  (left arm) Cuff size:   large  Vitals Entered By: Shonna Chock (October 27, 2009 11:43 AM) CC: Dizzy since Monday Comments REVIEWED MED LIST, PATIENT AGREED DOSE AND INSTRUCTION CORRECT Pulse Supine: 63 , Pulse Standing: 70    CC:  Dizzy since Monday.  History of Present Illness: Onset 10/25/2009 as orthostatic picture for < 30 seconds ; PMH of this X 1 "years ago". Some BPV  on Monday also. No  cardiac  triggers or associated neuro or ENT symptoms.(see ROS) except ?  frank vertigo. Neck pain for months; Rx: Tylenol w/o benefit  Allergies: 1)  ! * Altace 2)  ! * Hctz 3)  ! * Ranitidine 4)  ! * Saw Palmetto 5)  ! Flomax 6)  ! * Neurotin 7)  ! * Spe  Past History:  Past Medical History: Diverticulosis, colon Hyperlipidemia Hypertension candidiasis, urogenital sites, PMH of  skin cancer meralgia paraethetica  Review of Systems ENT:  Complains of decreased hearing and ringing in ears; denies nasal congestion and sinus pressure; Tinnitus is chronic, mild. No purulence , facial pain or frontal headache. Hearing loss R chronically.PMH of lancing for infections. GI:  Denies abdominal pain, bloody stools, dark tarry stools, and indigestion. GU:  Denies hematuria. Neuro:  Denies brief paralysis, headaches, numbness, sensation of room spinning, tingling, and weakness. Allergy:  Denies itching eyes and sneezing.  Physical Exam  General:  in no acute distress; cooperative throughout examination Eyes:  No corneal or conjunctival inflammation noted. EOMI. Perrla. Field of Vision grossly normal. Ears:  External ear exam shows no significant lesions or deformities.  Otoscopic examination reveals  clear canals, tympanic membranes are intact bilaterally without bulging, retraction, inflammation or discharge. Hearing is grossly normal bilaterally. Mouth:  Oral mucosa and oropharynx without lesions or exudates.  Teeth in good repair. No tongue  Neck:  Head tilted to R Heart:  Normal rate and regular rhythm. S1 and S2 normal without gallop, murmur, click, rub or other extra sounds. Pulses:  R and L carotid   pulses are full and equal bilaterally w/o bruits Extremities:  No clubbing, cyanosis, edema. Neurologic:  alert & oriented X3, cranial nerves II-XII intact, strength normal in all extremities, sensation intact to light touch, gait unstaedy , DTRs symmetrical and normal, finger-to-nose  and Romberg negative except for tremor of hands.     Impression & Recommendations:  Problem # 1:  HYPOTENSION, ORTHOSTATIC (ICD-458.0)  Orders: Venipuncture (34742) TLB-CBC Platelet - w/Differential (85025-CBCD)  Problem # 2:  BENIGN POSITIONAL VERTIGO (ICD-386.11)  Orders: Venipuncture (59563) ENT Referral (ENT)  Problem # 3:  TORTICOLLIS (ICD-723.5)  ? playing role in #1 & #2   Orders: ENT Referral (ENT)  Complete Medication List: 1)  Diovan 320 Mg Tabs (valsartan)  .... 1/2  by mouth once daily 2)  Metoprolol Tartrate 50 Mg Tabs (Metoprolol tartrate) .... 1/2 tab bid 3)  Tylenol  .Marland Kitchen.. 1 by mouth prn 4)  Plavix 75 Mg Tabs (Clopidogrel bisulfate) .Marland Kitchen.. 1 by mouth once  daily 5)  Fish Oil 1200 Mg Caps (Omega-3 fatty acids) .... Two times a day 6)  Fiber  .... Two times a day 7)  Ranitidine Hcl 150 Mg Tabs (Ranitidine hcl) .Marland Kitchen.. 1 two times a day pre meals 8)  Pravastatin Sodium 40 Mg Tabs (Pravastatin sodium) .Marland Kitchen.. 1 at bedtime 9)  Finasteride 5 Mg Tabs (Finasteride) .Marland Kitchen.. 1 once daily 10)  Ciprofloxacin Hcl 250 Mg Tabs (Ciprofloxacin hcl) .Marland Kitchen.. 1 two times a day  Patient Instructions: 1)  Diovan 320 mg 1/2 once daily . Isometric exercises pre standing as discussed.

## 2010-09-22 NOTE — Letter (Signed)
Summary: [facility]  [facility]   Imported By: Lanelle Bal 08/17/2010 08:10:48  _____________________________________________________________________  External Attachment:    Type:   Image     Comment:   External Document

## 2010-11-04 LAB — URINE MICROSCOPIC-ADD ON

## 2010-11-04 LAB — CBC
Hemoglobin: 16 g/dL (ref 13.0–17.0)
MCH: 32.9 pg (ref 26.0–34.0)
MCHC: 33.8 g/dL (ref 30.0–36.0)
MCV: 97.4 fL (ref 78.0–100.0)
Platelets: 202 10*3/uL (ref 150–400)

## 2010-11-04 LAB — URINALYSIS, ROUTINE W REFLEX MICROSCOPIC
Glucose, UA: NEGATIVE mg/dL
Protein, ur: >300 mg/dL — AB

## 2010-11-04 LAB — POCT I-STAT, CHEM 8
Calcium, Ion: 1.06 mmol/L — ABNORMAL LOW (ref 1.12–1.32)
Chloride: 105 mEq/L (ref 96–112)
HCT: 50 % (ref 39.0–52.0)
Potassium: 4.1 mEq/L (ref 3.5–5.1)
Sodium: 136 mEq/L (ref 135–145)

## 2010-11-04 LAB — DIFFERENTIAL
Basophils Absolute: 0 10*3/uL (ref 0.0–0.1)
Basophils Relative: 1 % (ref 0–1)
Monocytes Absolute: 0.5 10*3/uL (ref 0.1–1.0)
Neutro Abs: 4.6 10*3/uL (ref 1.7–7.7)
Neutrophils Relative %: 62 % (ref 43–77)

## 2010-11-08 LAB — DIFFERENTIAL
Basophils Relative: 0 % (ref 0–1)
Eosinophils Absolute: 0.1 10*3/uL (ref 0.0–0.7)
Monocytes Absolute: 0.6 10*3/uL (ref 0.1–1.0)
Monocytes Relative: 6 % (ref 3–12)

## 2010-11-08 LAB — URINALYSIS, ROUTINE W REFLEX MICROSCOPIC
Nitrite: NEGATIVE
pH: 5.5 (ref 5.0–8.0)

## 2010-11-08 LAB — CBC
HCT: 47.3 % (ref 39.0–52.0)
Hemoglobin: 15.5 g/dL (ref 13.0–17.0)
MCHC: 32.8 g/dL (ref 30.0–36.0)
MCV: 99.2 fL (ref 78.0–100.0)
RBC: 4.77 MIL/uL (ref 4.22–5.81)

## 2010-11-08 LAB — BASIC METABOLIC PANEL
CO2: 22 mEq/L (ref 19–32)
Chloride: 108 mEq/L (ref 96–112)
GFR calc Af Amer: >60 mL/min (ref >60)
Sodium: 138 mEq/L (ref 135–145)

## 2010-11-08 LAB — URINE MICROSCOPIC-ADD ON

## 2010-12-19 ENCOUNTER — Other Ambulatory Visit: Payer: Self-pay

## 2010-12-19 MED ORDER — VALSARTAN 320 MG PO TABS: 320.0000 mg | ORAL_TABLET | ORAL | Status: DC

## 2011-01-03 NOTE — Op Note (Signed)
NAMELENON, KUENNEN                ACCOUNT NO.:  [id number]   MEDICAL RECORD NO.:  [id number]          PATIENT TYPE:  INP   LOCATION:  5735                         FACILITY:  [facility]   PHYSICIAN:  Angelia Mould. Derrell Lolling, M.D.DATE OF BIRTH:  [birth year]   DATE OF PROCEDURE:  06/27/2007  DATE OF DISCHARGE:                               OPERATIVE REPORT   PREOPERATIVE DIAGNOSIS:  Acute cholecystitis.   POSTOPERATIVE DIAGNOSIS:  Acute cholecystitis, probable cholelithiasis,  possible choledocholithiasis.   OPERATION PERFORMED:  Laparoscopic cholecystectomy with intraoperative  cholangiogram.   SURGEON:  Angelia Mould. Derrell Lolling, M.D.   ASSISTANT:  Gabrielle Dare. Janee Morn, M.D.   OPERATIVE INDICATIONS:  This is a 77 year old gentleman who presented  with the relatively acute onset of epigastric pain.  He had a similar  episode one week previously.  He was also evaluated by Dr. Melvia Heaps  in May of this year for hematochezia.  He was found to have a fever of  101.9 and upper abdominal tenderness, although his abdomen was  relatively soft.  The CT scan showed the possibility of a little bit of  inflammatory change around the cystic duct but no biliary ductal  dilatation and no gross inflammatory process.  Liver function tests were  mildly elevated.  He was admitted and placed on IV antibiotics.  A  hepatobiliary scan was performed and showed that no tracer left the  liver after six hours.  The question of whether there was a complete  obstruction or whether there was hepatocellular disease was raised.  Dr.  Yancey Flemings saw the patient and felt that the pattern of liver enzyme  elevation was inconsistent with acute hepatitis.  We decided to go ahead  with cholecystectomy today.  His bilirubin was 1.6 yesterday and up to  4.8 today but the alkaline phosphatase remained normal.   OPERATIVE FINDINGS:  The gallbladder was acutely inflamed, thick walled,  somewhat gangrenous in patchy areas.  The cystic  duct was long but there  was a lot of thickening around it.  The cholangiogram showed non-dilated  bile ducts.  There was a question of a spherical filling defect in the  distal common bile duct and a question of an air bubble in the left  hepatic duct, but there was no obstruction with good flow of contrast  into the duodenum.  The stomach, duodenum, liver, small intestine, and  large intestine were, otherwise, grossly normal to inspection.   OPERATIVE TECHNIQUE:  Following the induction of general endotracheal  anesthesia, the patient's abdomen was prepped and draped in a sterile  fashion.  The patient was identified as the correct patient and correct  procedure.  0.5% Marcaine with epinephrine was used as a local  infiltration anesthetic.  A transverse incision was made at the superior  rim of the umbilicus.  The fascia was incised in the midline and the  abdominal cavity entered under direct vision.  A 10 mm Hassan trocar was  inserted and secured with a purse-string suture of 0 Vicryl.  Pneumoperitoneum was created.  The video cam was inserted with  visualization and  findings as described above.  A 10 mm trocar was  placed in the right subepigastric area and two 5 mm trocars placed in  the right upper quadrant.   We carefully dissected and teased the omentum off of the inflamed  gallbladder.  The gallbladder was tense and we evacuated the bile from  the fundus.  We then were able to grasp and lift the gallbladder.  With  using an angled scope, we were able to slowly tease all of the omentum  and inflamed tissue down off of the gallbladder wall until we could  identify the infundibulum.  It was quite necrotic and inflamed down low.  We dissected out the cystic artery as it went onto the wall of the  gallbladder, secured it with metal clip, and divided it.  We very  cautiously dissected inflamed tissue on the right and left away from  what was felt to be the cystic duct.  We had to  mobilize the gallbladder  a fair amount to see the anatomy.  We ultimately could identify the  cystic duct but it was quite necrotic and tore part way through.  We  inserted a balloon catheter into the cystic duct and we were able to get  a cholangiogram.  The cholangiogram showed no obstruction or dilatation  but question of a distal filling defect as described above.  The cystic  duct was quite long. The Reddick catheter was removed.  We mobilized the  cystic duct a little bit and we were able to secure it with a single  Endoloop tie made out of PDS suture.  This actually looked quite good.  We then dissected the gallbladder from its bed and placed it in a  specimen bag and removed it.   We then irrigated the subphrenic and subphrenic spaces extensively with  about 2000 mL of saline.  Hemostasis seemed good.  The bed of the  gallbladder was raw and we placed a piece of Surgicel there.  I felt  that because of the necrotic cystic duct, he was at an increased risk of  bile leak and so I placed a 19-French Blake drain in the subphrenic  space and brought it out through the lateral trocar site.  This was  sutured to the skin with a nylon suture and connected to a suction bulb.  I then inspected the abdomen and pelvis one more time and saw no  bleeding or abnormalities.  The trocars were removed under direct  vision.  There was no bleeding from the trocar sites.  The  pneumoperitoneum was released.  The fascia at the umbilicus was closed  with 0 Vicryl sutures.  The skin incisions were closed with subcuticular  sutures of 4-0 Monocryl and Dermabond.  Clean bandages were placed and  the patient was taken to the recovery room in stable condition.  Estimated blood loss was about 50-75 mL.  Complications were none.  Sponge, needle, and instrument counts were correct.      Angelia Mould. Derrell Lolling, M.D.  Electronically Signed     HMI/MEDQ  D:  06/27/2007  T:  06/27/2007  Job:  [id number]   cc:    Titus Dubin. Alwyn Ren, MD,FACP,FCCP  Wilhemina Bonito. Marina Goodell, MD  Raenette Rover Felicity Coyer, MD  Quita Skye Hart Rochester, M.D.

## 2011-01-03 NOTE — H&P (Signed)
NAMEMACARI, ZALESKY NO.:  [id number]   MEDICAL RECORD NO.:  [id number]          PATIENT TYPE:  INP   LOCATION:  1823                         FACILITY:  [facility]   PHYSICIAN:  Hollice Espy, M.D.DATE OF BIRTH:  [birth year]   DATE OF ADMISSION:  06/26/2007  DATE OF DISCHARGE:                              HISTORY & PHYSICAL   Consultant on this case is Sandria Bales. Ezzard Standing, MD, general surgery.  The  patient's PCP is Titus Dubin. Alwyn Ren, MD.   CHIEF COMPLAINT:  Abdominal pain.   HISTORY OF PRESENT ILLNESS:  The patient is a 77 year old white male,  past medical history of hypertension and hyperlipidemia, who for the  past week has been having episodes of intermittent abdominal pain,  usually described as midepigastric to left upper quadrant, but he stated  over the last 24 hours it has been hurting with increased frequency,  much more severe in the midepigastric and over to the right upper  quadrant area.  The patient could not take the pain any more and he came  to the emergency room.  He was noted on admission to have an initial  temperature of 99.9 but went as high as 101.9.  His O2 saturation is  around 92% on room air.  Labs were ordered on the patient and he was  found to have a normal white count, however with an 88% shift.  His  lipase level was normal but his transaminases are noted for an elevated  AST in the 300s and an ALT in the 140s with a normal alkaline  phosphatase.  Because of the abdominal pain there was a concern about a  gallbladder process.  A CT scan of the abdomen and pelvis showed some  inflammation around the cystic duct suggesting the possibility of acute  cholecystitis.  In addition and also incidental note was a right common  iliac aneurysm measuring 2.1 mm with possible focal dissection.  With  these findings it was felt best that the patient come in for further  evaluation.  The patient was evaluated by Dr. Ezzard Standing of [facility], who was not impressed given the patient's absence of pain after  he received pain medication in the emergency room.  He was not impressed  with the patient's CT scan findings as well as a normal alkaline  phosphatase level.  He recommended that the patient be n.p.o., put on IV  antibiotics given his shift on his white count, and undergo a HIDA scan.  He also noted no evidence of any leakage around the aneurysm on his  common iliac vessel.  The patient himself currently is doing okay, he is  weak but denies any headaches or vision changes.  He does complain of  some mild nausea, no vomiting, no dysphagia, no chest pain or  palpitations.  No shortness of breath, wheezing, coughing.  No current  abdominal pain although he describes his pain earlier as midepigastric,  radiating to the right upper quadrant area.  No hematuria or dysuria.  No constipation or diarrhea.  No focal extremity numbness, weakness or  pain.  Review of systems is otherwise negative.   PAST MEDICAL HISTORY:  1. Hypertension.  2. Hyperlipidemia.   MEDICATIONS:  He is on Diovan, metoprolol and Vytorin.   He denies any tobacco, alcohol or drug use.   No known drug allergies.   FAMILY HISTORY:  Noncontributory.   PHYSICAL EXAM:  The patient's vitals on admission:  Temperature 99.9, as  high as 101.9, blood pressure 107/62, respirations 22, heart rate 88.  O2 saturation 92% on room air.  GENERAL:  He is alert and oriented x3, in some general distress  secondary to weakness.  HEENT:  Normocephalic, atraumatic.  His mucous membranes are dry.  He  has no carotid bruits.  HEART:  Regular rate and rhythm, S1 and S2, a 2/6 systolic ejection  murmur, soft.  LUNGS:  Clear to auscultation bilaterally, although poor inspiratory  effort.  ABDOMEN:  Soft with some mild tenderness in the midepigastric area but  very minimal at best.  Nondistended.  Decreased bowel sounds.  EXTREMITIES:  No clubbing, cyanosis, or  edema.   LAB WORK:  White count 7.6, H&H 15 and 46, MCV of 97, platelet count of  219, 88% shift.  Lipase 44.  UA negative.  Sodium 138, potassium 3.9,  chloride 105, bicarb 24, BUN 12, creatinine 1.1, glucose 129.  LFTs are  noted for an albumin of 3.2, an AST of 313, ALT of 140, and a total  bilirubin of 1.6 with a direct bilirubin of 0.7.  Blood cultures have  been drawn and pending.  CT scan of the abdomen and pelvis shows again  questionable inflammation around the cystic duct and also a 2.1-mm  common iliac aneurysm on the right side.   ASSESSMENT AND PLAN:  1. Questionable cholecystitis.  Discussed with Dr. Ezzard Standing of [facility].  Make the patient n.p.o., order a HIDA scan, put      him on IV Protonix as well as Cipro and Flagyl.  Continue to follow      with minimal pain and nausea medication.  2. Narcotic intolerance.  The patient is quite sensitive to narcotics.      Receiving 2 mg of  Dilaudid led him to have decreased respiratory      effort.  We will plan to use narcotics sparingly.  3. Hypertension.  This is currently stable.  Because of his low blood      pressure, we are holding his antihypertensives.  4. Hyperlipidemia.  Because of his elevated transaminases, we will      hold his Vytorin for now, recheck labs.  5. Common iliac aneurysm.  Discuss with CVTS to confirm no further      intervention is needed.      Hollice Espy, M.D.  Electronically Signed     SKK/MEDQ  D:  06/26/2007  T:  06/26/2007  Job:  [id number]   cc:   Sandria Bales. Ezzard Standing, M.D.  Titus Dubin. Alwyn Ren, MD,FACP,FCCP

## 2011-01-03 NOTE — Procedures (Signed)
VASCULAR LAB EXAM   INDICATION:  Followup, right common iliac aneurysm.  Patient had CT of  his abdomen at [facility] in November, 2008 which  showed a right common iliac artery aneurysm measuring approximately 2.8  cm.   HISTORY:  Diabetes:  No.  Cardiac:  No.  Hypertension:  Yes.   EXAM:  Duplex imaging of right common iliac artery.   IMPRESSION:  Duplex imaging of aorta and common iliac arteries revealed  a right common iliac artery aneurysm measuring approximately 2.66 cm  anteroposterior X 3.21 cm transverse with thrombus noted.  Aorta  measures approximately 2.47 cm anteroposterior X 2.68 cm in the largest  diameter.  Doppler signals in both common iliac and right common femoral  arteries are all within normal limits.   ___________________________________________  Quita Skye Hart [place], M.D.   DP/MEDQ  D:  09/24/2007  T:  09/25/2007  Job:  [id number]

## 2011-01-03 NOTE — Letter (Signed)
Jan 17, 2007    Titus Dubin. Alwyn Ren, MD,FACP,FCCP  [phone] [place], [place]  [address]   RE:  [name], [name]  MRN:  [id number]  /  DOB:  [birth year]   Dear Dr. Alwyn Ren:   Upon your kind referral, I had the pleasure of evaluating your patient  and I am pleased to offer my findings.  I saw [name] in the office  today.  Enclosed is a copy of my progress note that details my findings  and recommendations.   Thank you for the opportunity to participate in your patient's care.    Sincerely,      Barbette Hair. Arlyce Dice, MD,FACG  Electronically Signed    RDK/MedQ  DD: 01/17/2007  DT: 01/17/2007  Job #: [id number]

## 2011-01-03 NOTE — Procedures (Signed)
DUPLEX ULTRASOUND OF ABDOMINAL AORTA   INDICATION:  Followup right common iliac artery aneurysm.   HISTORY:  Diabetes:  No.  Cardiac:  No.  Hypertension:  Yes.  Smoking:  No.  Connective Tissue Disorder:  Family History:  No.  Previous Surgery:  No.   DUPLEX EXAM:         AP (cm)                   TRANSVERSE (cm)  Proximal             1.96 cm                   2.23 cm  Mid                  2.55 cm                   2.63 cm  Distal               1.52 cm                   1.84 cm  Right Iliac          3.03 cm                   3.24 cm  Left Iliac           1.54 cm                   1.56 cm   PREVIOUS:  Date: 09/24/07  AP:  2.47  TRANSVERSE:  2.68   IMPRESSION:  1. Previous right common iliac artery aneurysm measurements equal 2.66      cm X 3.21 cm.  2. Right common iliac artery aneurysm appears stable, today measuring      3.03 cm X 3.24 cm at its largest.  3. No evidence of abdominal aortic aneurysm.  4. Left common iliac artery appears within normal limits.   ___________________________________________  Quita Skye Hart Rochester, M.D.   AS/MEDQ  D:  03/31/2008  T:  03/31/2008  Job:  [id number]

## 2011-01-03 NOTE — Consult Note (Signed)
[name], [name]                ACCOUNT NO.:  [id number]   MEDICAL RECORD NO.:  [id number]          PATIENT TYPE:  INP   LOCATION:  5735                         FACILITY:  [facility]   PHYSICIAN:  Quita Skye. Hart [place], M.D.  DATE OF BIRTH:  [birth year]   DATE OF CONSULTATION:  06/26/2007  DATE OF DISCHARGE:                                 CONSULTATION   CHIEF COMPLAINT:  Right common iliac artery aneurysm found on CT scan.   HISTORY OF PRESENT ILLNESS:  I was asked to see this patient in  consultation by Dr. Felicity Coyer regarding a right common iliac artery  aneurysm discovered by CT scan obtained today in the emergency  department.  The patient was admitted with acute onset of epigastric  pain with one episode yesterday and a similar episodes day.  He has a  history of some nausea and vomiting following these symptoms, but has  not had previous abdominal pain in the past.  He underwent evaluation in  the emergency department and CT scan revealed some inflammation around  the cystic duct but no definite gallbladder disease, and HIDA scan was  performed as well.  The CT scan was reviewed by me and revealed a 2.8-cm  localized right common iliac artery aneurysm originating about 1.5 cm  distal to the origin of the common iliac artery and terminating about 1  cm proximal to the internal iliac artery.  This had laminated thrombus  and some localized dissection within the aneurysm but no evidence of any  leakage   PAST MEDICAL HISTORY:  1. Hypertension.  2. Hyperlipidemia.  3. Negative for coronary artery disease, stroke, COPD, hyperlipidemia.   PAST SURGICAL HISTORY:  None.   FAMILY HISTORY:  Positive for coronary artery disease in his father and  sister, negative for diabetes and stroke.   SOCIAL HISTORY:  Does not use tobacco.  Drinks occasional alcohol.   ALLERGIES:  None known.   MEDICATIONS AND REVIEW OF SYSTEMS:  Please see history and physical.   PHYSICAL EXAMINATION:  VITAL  SIGNS:  Temperature 101.9, blood pressure  107/63, heart rate 99.  GENERAL:  He is a healthy-appearing male patient who is no apparent  distress, alert and oriented x3.  NECK:  Supple, 3+ carotid pulses palpable.  No bruits are audible.  No  palpable adenopathy in the neck.  CHEST:  Clear to auscultation.  CARDIOVASCULAR:  Exam reveals regular rhythm with no murmurs.  ABDOMEN:  Soft with mild discomfort in the epigastric area.  No  tenderness in the lower abdomen.  There is no rebound tenderness.  EXTREMITIES:  Upper extremity pulses 3+ bilaterally.  Extremity exam  reveals 3+ femoral, 2+ popliteal, 2+ posterior tibial pulses palpable  bilaterally.   IMPRESSION:  1. Abdominal pain, etiology unknown, rule out gallbladder disease.  2. Asymptomatic right common iliac artery aneurysm with localized      dissection.   PLAN:  Do not think his aneurysm is related to his present illness, but  will see the patient in 2-3 months in followup for consideration for  stent grafting of this right common iliac  artery aneurysm.      Quita Skye Hart [place], M.D.  Electronically Signed     JDL/MEDQ  D:  06/26/2007  T:  06/27/2007  Job:  [id number]   cc:   Vikki Ports A. Felicity Coyer, MD

## 2011-01-03 NOTE — Discharge Summary (Signed)
NAMEFERMON, [name]                ACCOUNT NO.:  [id number]   MEDICAL RECORD NO.:  [id number]          PATIENT TYPE:  INP   LOCATION:  5737                         FACILITY:  [facility]   PHYSICIAN:  Georgina Quint. Plotnikov, MDDATE OF BIRTH:  August 07, 1926   DATE OF ADMISSION:  06/26/2007  DATE OF DISCHARGE:  06/30/2007                               DISCHARGE SUMMARY   DISCHARGE MEDICATIONS:  1. Vicodin 5/101 p.o. four times a day p.r.n. pain, #20 given.  2. Cipro 500 mg one twice a day x7 days.  3. Phenergan 25 mg one to two up to four times a day as needed for      nausea.  4. Protonix 40 mg daily.  5. K-Dur 10 mEq one daily x2 weeks.  6. Resume other home medicines.   FOLLOW UP:  Follow up with Dr. Derrell Lolling in 4-6 days.  Dr. Alwyn Ren in 2  weeks with a repeat BMET (to check on low potassium).   WOUND CARE:  Wound care  instructions provided by Dr. Derrell Lolling.   DIET:  Low salt, low fat.   SPECIAL INSTRUCTIONS:  Keep written record of the bile drainage.   ACTIVITY:  No heavy lifting x4 weeks.  Walk with assistance.   DISCHARGE DIAGNOSES:  1. Acute cholecystitis, status post laparoscopic cholecystectomy.  2. Delirium, postop in the recovery room, likely related to      medications.  No previous problems.  3. Right common iliac aneurysm 2.8 cm, incidental finding unrelated to      his current problem.  He will need to see Dr. Hart Rochester in 2-3 months.  4. Hypertension, doing well on therapy.  5. Hypokalemia, mild.  Will replace orally.  6. Tachycardia, resolved.  7. Nausea, resolved.   CONSULTATIONS:  1. Surgery, Dr. Derrell Lolling.  2. Gastroenterology, Dr. Marina Goodell.  3. CVTS, Dr. Hart Rochester.   PROCEDURES:  1. Laparoscopic cholecystectomy on November 6, status post drain      placed.  2. CT angiogram of the abdomen and pelvis.  3. Operative cholangiogram.   HISTORY OF PRESENT ILLNESS:  The patient is a 77 year old male who was  admitted with acute-onset of the gastric abdominal pain.  For the  details, please see Dr. Aurelio Jew history and physical.   HOSPITAL COURSE:  During the course of hospitalization, the patient was  consulted by Dr. Derrell Lolling and Dr. Marina Goodell.  HIDA scan was obtained with  incidental finding of common iliac aneurysm found and Dr. Hart Rochester  consulted.  He felt that the problem was related to his current  presentation.  In the recovery room, he developed episode of confusion  that promptly resolved.  His recovery process was unremarkable with  gradual resumption of his normal activities.  On the day of discharge,  he was feeling well.  He was able to eat solid meals without a problem,  was able to walk down the hallway and had a small bowel movement.  His  blood pressure was 122/66, heart rate 86, respirations 18, temperature  98.8, T-max 99.2, O2 saturations 94% on room air.   PHYSICAL EXAMINATION:  HEENT:  Moist.  NECK:  Supple.  LUNGS:  Clear with decreased breath sounds at bases.  HEART:  Regular S1 and S2.  ABDOMEN:  Soft, nondistended, postop drain in place.  EXTREMITIES:  Lower extremities without edema.  Calves nontender.  NEUROLOGIC:  She is alert, oriented and cooperative.   LABORATORY DATA AND X-RAY FINDINGS:  White cell count is 9.9, hemoglobin  13.8, platelets 206.  Potassium 3.3, 3.9 today, glucose 102.  Total  bilirubin 1.6, AST and ALT normal, Alkaline phosphatase 56.   Cholangiogram intraoperatively with no evidence for retained calculus,  probable air bubble in the left hepatic duct.  Chest x-ray with  diminished lung volumes and bibasilar atelectasis on November 6.  CT  angiogram of the chest with probable COPD, no evidence of thoracic  aortic dissection.  CT of the abdomen with suggestion of inflammation  along with cystic duct suspicious for cholecystitis, no abdominal aortic  aneurysm with dissection, some degree of stenosis at the origin of the  celiac artery.  There is also a 2.8 cm, right common iliac artery  aneurysm with a  plaque versus dissection and diverticulosis was present.  HIDA scan showed nonvisualization of the biliary duct, gallbladder or  small bowel up to 6 hours consistent with acute cholecystitis.      Georgina Quint. Plotnikov, MD  Electronically Signed     AVP/MEDQ  D:  06/30/2007  T:  07/01/2007  Job:  [id number]   cc:   Angelia Mould. Derrell Lolling, M.D.  Titus Dubin. Alwyn Ren, MD,FACP,FCCP

## 2011-01-03 NOTE — Assessment & Plan Note (Signed)
OFFICE VISIT   [name], [name]  DOB:  05-23-1926                                       09/24/2007  ZOXWR#:[id number]   The patient returns for followup regarding a right iliac aneurysm, which  was discovered on a CT scan while hospitalized in November 2008 for  acute cholecystitis.  He underwent a laparoscopic cholecystectomy and  has done well from that standpoint.  CT scan revealed that the aneurysm  measured 2.8 cm in diameter, and there was no significant aortic  aneurysm proximal to this.  He has had no abdominal symptoms since he  recovered from his cholecystectomy.  He denies any neurologic symptoms  such as hemiparesis, aphagia, amaurosis fugax, diplopia, blurred vision,  or syncope, and has no chest pain, dyspnea on exertion, PND, or  orthopnea.  He does have leg discomfort after ambulating about 1/2 mile.   PHYSICAL EXAM:  Blood pressure 140/70, heart rate 64, respirations 14.  In general, he is a healthy-appearing male, in no apparent distress.  Carotid pulses are 3+ and no audible bruits.  Chest is clear to  auscultation.  Abdomen is soft and nontender with a small pulsatile mass  in the right lower quadrant approximating 3 cm in diameter, which is  nontender.  He has 3+ femoral and dorsalis pedis pulses palpable  bilaterally.   Duplex scan today reveals the right iliac aneurysm is slightly enlarged  to 3.21 by 2.66 cm, and the aorta measures 2.47x2.7.   I have reassured the patient regarding these findings, and that we would  continue to follow him every 6 months, and he will return at that time  with a duplex scan in our office to see if there has been any  enlargement.   Quita Skye Hart Rochester, M.D.  Electronically Signed   JDL/MEDQ  D:  09/24/2007  T:  09/25/2007  Job:  [id number]   cc:   Titus Dubin. Alwyn Ren, MD,FACP,FCCP

## 2011-01-03 NOTE — Assessment & Plan Note (Signed)
[name]                         GASTROENTEROLOGY OFFICE NOTE   NAME:Burns, Benjamin ISAKSON                       MRN:          [id number]  DATE:01/17/2007                            DOB:          1926-03-25    REASON FOR CONSULTATION:  Hematochezia.   HISTORY OF PRESENT ILLNESS:  [name] is a 77 year old white male  referred through the courtesy of Dr. Alwyn Burns for evaluation.  Approximately 5 days ago, he developed frank hematochezia.  This  continued for 2 or 3 days though with much lesser amounts.  He has had  no bleeding for the last 48 hours.  He denies rectal or abdominal pain.  He initially had some cramps that preceded his bleeding.  He has a  history of polyps and left sided diverticula on his last examination 14  months ago.  He did receive a course of antibiotics a couple of weeks  ago for a skin infection.  Family history is pertinent for a sister with  colon cancer.  He is on no gastric irritants including nonsteroidal.  There is no prior history of ulcer disease.  Hemoglobin was 17.2 at Dr.  Frederik Burns office.   PAST MEDICAL HISTORY:  Pertinent for hypertension.  He is status post  tonsillectomy.   FAMILY HISTORY:  Noncontributory.   MEDICATIONS:  Include:  1. Baby aspirin.  2. Vytorin.  3. Metronidazole.  4. Diovan.  5. Metoprolol.  6. Prilosec.   He has no allergies.  He neither smokes nor drinks.  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   REVIEW OF SYSTEMS:  Positive for fatigue.   EXAMINATION:  Pulse 68, blood pressure 164/86, weight 194.  HEENT:  EOMI. PERRLA. Sclerae are anicteric.  Conjunctivae are pink.  NECK:  Supple without thyromegaly, adenopathy or carotid bruits.  CHEST:  Clear to auscultation and percussion without adventitious  sounds.  CARDIAC:  Regular rhythm; normal S1 S2.  There are no murmurs, gallops  or rubs.  ABDOMEN:  Bowel sounds are normoactive.  Abdomen is soft, non-tender and  non-distended.  There are no abdominal masses, tenderness, splenic  enlargement or hepatomegaly.  EXTREMITIES:  Full range of motion.  No cyanosis, clubbing or edema.  RECTAL:  Deferred.   IMPRESSION:  Self limited rectal bleeding.  If stable hemoglobin, likely  reflects a limited bleed from the colon.  Etiology may include  hemorrhoids and diverticula or, less likely, pseudomembranous colitis.  At this point his bleeding has clinically subsided.   RECOMMENDATIONS:  1. Continue metronidazole for a total of 10 day course.  2. No further GI workup unless the bleeding recurs.  Should this be      the case, then I would consider a flexible sigmoidoscopy.     Barbette Hair. Arlyce Dice, MD,FACG  Electronically Signed    RDK/MedQ  DD: 01/17/2007  DT: 01/17/2007  Job #: [id number]   cc:   [name]. Benjamin Ren, MD,FACP,FCCP

## 2011-01-03 NOTE — Consult Note (Signed)
[name], [name]                ACCOUNT NO.:  [id number]   MEDICAL RECORD NO.:  [id number]          PATIENT TYPE:  INP   LOCATION:  5735                         FACILITY:  [facility]   PHYSICIAN:  Sandria Bales. Ezzard Standing, M.D.  DATE OF BIRTH:  29-Jun-1926   DATE OF CONSULTATION:  DATE OF DISCHARGE:  06/26/2007                                 CONSULTATION   REASON FOR CONSULTATION:  Gallbladder disease.   HISTORY OF PRESENT ILLNESS:  This is a 77 year old white male who is a  patient of Dr. Marga Burns who presented to the [facility] Emergency  Room with acute onset of epigastric abdominal pain.  He says he had a  similar episode last week, but it did not last as long, and he did not  seek any medical attention nor did he have an evaluation for that.   From a GI standpoint, he was seen by Dr. Barnet Burns in May of 2008 for  hematochezia, but this was self-limiting.  Dr. Arlyce Burns saw no significant  etiology at that time.  He has had no history of peptic ulcer disease,  liver disease, pancreatic disease, or colon disease.  He has had no  prior abdominal surgery.   PAST MEDICAL HISTORY:   ALLERGIES:  HE HAS NO ALLERGIES.   CURRENT MEDICATIONS:  1. Diovan.  2. Metoprolol.  3. Vytorin.   REVIEW OF SYSTEMS:  NEUROLOGIC:  No seizure or loss of consciousness.  PULMONARY:  He quit smoking 30 years ago.  He has no lung disease or  lung problems.  CARDIAC:  He has been treated for hypertension for a number of years.  He has had no cardiac catheterization.  No chest pain.  GASTROINTESTINAL:  See history of present illness.  UROLOGIC:  He was hospitalized in January 2007 for prostatitis, but this  has resolved.  He has had no further trouble.  He has not seen a  urologist that I am aware of on any regular basis.  MUSCULOSKELETAL:  No joint or back trouble.   He is retired from the post office.  He is accompanied by his wife.  Interestingly, his wife had gallbladder sugery by Dr. Wenda Burns a  number of years ago.   PHYSICAL EXAMINATION:  VITAL SIGNS:  His temperature is 101.9, blood  pressure 107/63, pulse 69.  HEENT:  Unremarkable.  NECK:  Supple without masses or thyromegaly.  LUNGS:  Clear to auscultation with symmetric breath sounds.  HEART:  Regular rate and rhythm without murmur or rub.  ABDOMEN:  Soft.  I cannot find any localized epigastric or right upper  quadrant tenderness.  He has no rebound, no guarding.  He has few bowel  sounds.  EXTREMITIES:  He had good strength in all 4 extremities.  NEUROLOGICALLY:  He was grossly intact.   LABORATORY DATA:  Labs that I have show a sodium of 138, potassium 3.9,  chloride of 105, BUN of 12.  His white blood count is 7600, with 885  neutrophils, a hemoglobin of 15, hematocrit of 48.  His total protein  was 5.7, albumin 3.2.  SGOT of 313,  SGPT of 140.  His total bilirubin  1.6.  I reviewed his CT scan with Dr. Laveda Burns.  He does have what looks like  some inflammation around his cystic duct, this is a subtle finding.  He  does not have any obvious gallstones.  He also has a small, maybe 2 cm,  right iliac artery aneurysm.   IMPRESSION:  1. Probable gallbladder disease per CT scan findings with mildly      elevated liver functions.  His physical exam is actually less      impressive.  Discussed with Dr. Virginia Burns who will be seeing      the patient for [place], and I suggested obtaining a hepatobiliary      scan.  If this is positive, will consider surgery.  If negative,      may need further evaluation.  2. Hypertension.  3. Hypercholesterolemia.  4. Small right iliac aneurysm.      Sandria Bales. Ezzard Standing, M.D.  Electronically Signed     DHN/MEDQ  D:  06/26/2007  T:  06/26/2007  Job:  [id number]   cc:   [name]. Benjamin Ren, MD,FACP,FCCP  [name], M.D.  [name]. Benjamin Dice, MD,FACG

## 2011-01-06 NOTE — Op Note (Signed)
[name], [name]                ACCOUNT NO.:  [id number]   MEDICAL RECORD NO.:  [id number]          PATIENT TYPE:  INP   LOCATION:  6715                         FACILITY:  [facility]   PHYSICIAN:  [name], M.D. LHCDATE OF BIRTH:  [birth year]   DATE OF PROCEDURE:  09/10/2005  DATE OF DISCHARGE:                                 OPERATIVE REPORT   DISCHARGE MEDICATIONS:  1.  Cipro 500 mg twice daily for two weeks.  2.  Resume other home medicines.   Follow up with Dr. Alwyn Ren in three or four days.   SPECIAL INSTRUCTIONS:  Call if problems.   ACTIVITY:  Increase activity slowly.   DISCHARGE DIAGNOSES:  1.  Prostatitis with possible urosepsis.  2.  Syncope related to problem #1.  3.  Hypertension.  4.  Dyslipidemia.   HISTORY:  The patient is 77 year old male who was admitted to the hospital  with syncope in the context of probable uncontrolled prostatitis on September 07, 2005. For the details, please address Dr. Frederik Pear history and physical.   HOSPITAL COURSE:  During the course of hospitalization the patient was  started on ampicillin and gentamicin intravenously. There was no recurrence  of his syncope.  His condition has gradually improved.  On the day of  discharge he is feeling well.  No weakness or lightheadedness.  No chills or  rigors.  He is able to ambulate.  No digestive problems.  His temperature  was 98.5 (T-max 99.0), oxygen saturations 95% room air, blood pressure  100/56, heart rate 56.  He is in no acute distress.  Lungs clear, heart  regular.  Abdomen soft, nontender, no costovertebral tenderness.  Calves  normal.  Skin clear.   Labs today revealed white count 5400, hemoglobin 14.6, BMET normal.   Lab work telemetry with normal sinus rhythm and PVCs.  Urine culture no  growth.  Cardiac enzymes negative.  Initial white cell count 5100.  Urinalysis negative.  Blood cultures negative.           ______________________________  [name],  M.D. [facility]     AVP/MEDQ  D:  09/10/2005  T:  09/11/2005  Job:  [id number]   cc:   Titus Dubin. Alwyn Ren, M.D. [facility]  [phone] [place]  [place] [address]

## 2011-01-06 NOTE — H&P (Signed)
NAMEANEL, PUROHIT                ACCOUNT NO.:  [id number]   MEDICAL RECORD NO.:  [id number]          PATIENT TYPE:  INP   LOCATION:  6702                         FACILITY:  [facility]   PHYSICIAN:  Titus Dubin. Alwyn Ren, M.D. Romualdo Bolk OF BIRTH:  Apr 13, 1926   DATE OF ADMISSION:  09/07/2005  DATE OF DISCHARGE:                                HISTORY & PHYSICAL   [name] is a 77 year old white male who was admitted to the hospital  with syncope in the context of probable uncontrolled prostatitis.   He woke up and went to the bathroom early in the morning on January 18.  Immediately after completing his urination he turned and experienced  syncope.  He had been up a total of approximately three minutes.  The event  occurred approximately 6:30 a.m.  He denies straining with urination.  He  denied any dysrhythmias prior to this syncope.  He also had no seizure  stigmata.   The evening of January 17 approximately 8:30 he had frank rigor and chills  for 30-40 minutes with temperature up to 101.   Significantly, he was seen on September 05, 2005 as an outpatient.  He stated  he had hemorrhoids.  He describe it as if he had a golf ball in his  rectum.  He stated that he found himself having to strain at stool.  The  symptoms had been present for approximately four days and had been  unresponsive to Preparation-H.  He denied any constitutional symptoms at  that time and denied melena.  Colonoscopy in 2002 had revealed  diverticulosis.  On examination the prostate was found to be enlarged and  mushy and tender.  PSA was not repeated as it had been 0.51 in June of 2006.  He was placed on Septra DS one every 12 hours with 8 ounces of water.   Because of the syncope and the fever he was admitted to the hospital for  parental therapy and telemetric monitoring.   PAST MEDICAL HISTORY:  1.  Tonsillectomy in 1999.  2.  He was hospitalized with paraesthesias in 1999 and has a diagnosis of      meralgia  paresthetica.  3.  History of skin cancer.  4.  Hypertension.  5.  Dyslipidemia.   FAMILY HISTORY:  Myocardial infarction in his father.  Colon cancer and  hypertension in his mother.  Breast cancer in maternal grandmother.  Myocardial infarction maternal grandfather.  Congestive heart failure in a  sister.   He has not smoked since 1975.  He drinks occasionally.   He is on Advicor 500/20, but has not been able to take it due to the present  illness.  He is on Diovan 160 mg daily.  He is on metoprolol 50 mg one-half  twice a day, 81 mg of aspirin, Metamucil daily, Omega three fatty acids, and  Prilosec.   He is intolerant or allergic to HYDROCHLOROTHIAZIDE, ALTACE, and RANITIDINE.  Interestingly, the ranitidine causes arthralgias and myalgias.   REVIEW OF SYSTEMS:  Positive for diffuse headaches and some low back pain.  He denies any significant  chest pain.  He has had no active pulmonary or  upper respiratory tract symptoms.  Despite the prostatism he has not had  genitourinary symptoms either.   PHYSICAL EXAMINATION:  GENERAL:  Affect is somewhat flat.  He does not  appear acutely ill.  VITAL SIGNS:  Temperature 100.7, pulse 68, blood pressure 120/72.  HEENT:  Arteriolar narrowing is present.  The left nasolabial fold is  decreased slightly.  The remainder of otolaryngologic examination is  unremarkable.  Thyroid is normal to palpation.  HEART:  Sounds are somewhat distant with an S4.  CHEST:  Clear.  SKIN:  Hot and dry with some exfoliation, particularly over the posterior  thorax.  ABDOMEN:  He has no lymphadenopathy or organomegaly.  GENITOURINARY:  Not repeated as it was done on January 16.  NEUROLOGIC:  There are no localizing neuropsychiatric deficits except for  his affect.   He is now admitted with probable urosepsis related to prostatitis.  The  syncope is felt to be secondary to #1 with a possible element of postural  hypotension.  He does not describe  micturition syncope, but has been  straining at stool.   He will be admitted to telemetry and cultures will be collected and  parenteral antibiotics initiated.      Titus Dubin. Alwyn Ren, M.D. [facility]  Electronically Signed     WFH/MEDQ  D:  09/08/2005  T:  09/08/2005  Job:  [id number]

## 2011-01-24 ENCOUNTER — Encounter: Payer: Self-pay | Admitting: Internal Medicine

## 2011-01-24 ENCOUNTER — Ambulatory Visit (INDEPENDENT_AMBULATORY_CARE_PROVIDER_SITE_OTHER): Payer: Medicare Other | Admitting: Internal Medicine

## 2011-01-24 VITALS — BP 132/78 | HR 64 | Temp 98.5°F | Wt 196.2 lb

## 2011-01-24 DIAGNOSIS — N6452 Nipple discharge: Secondary | ICD-10-CM

## 2011-01-24 DIAGNOSIS — N644 Mastodynia: Secondary | ICD-10-CM

## 2011-01-24 DIAGNOSIS — N6459 Other signs and symptoms in breast: Secondary | ICD-10-CM

## 2011-01-24 NOTE — Patient Instructions (Signed)
Stop caffeine & Finasteride. Vitamin E 400 units / day. Keep mammography appt

## 2011-01-24 NOTE — Progress Notes (Signed)
Addended by: Doristine Devoid on: 01/24/2011 01:14 PM   Modules accepted: Orders

## 2011-01-24 NOTE — Progress Notes (Signed)
  Subjective:    Patient ID: [name], male    DOB: 08/26/1925, 77 y.o.   MRN: [id number]  HPI Onset:2-3 weeks ago ( wife says longer) as soreness @ [place] upon awakening Trigger/injury:no Pain, redness swelling:all with some discharge on T shirts Constitutional: Fever, chills, sweats, weight change:no Heme: Abnormal bruising or clotting, lymphadenopathy:no Treatment/respo.nse:no  He is not on Spironolactone, but he is on Finasteride. He drinks < 1 cup of coffee/ day & 1 tea/ day MGM breast cancer  Review of Systems     Objective:   Physical Exam Gen.: Healthy and well-nourished in appearance. Alert, appropriate and cooperative throughout exam. Abdomen: Bowel sounds normal; abdomen soft and nontender. No masses, organomegaly .ventral hernia  noted. Breast: minimal fibrocystic changes; slight tenderness  Skin: Intact without suspicious lesions or rashes. Lymph: No cervical or  axillary lymphadenopathy present. Psych: Mood and affect are  flat.                                                                                        Assessment & Plan:  #1 breast pain & discharge Plan: Stop Finasteride &  caffeine; take 400 units of vitamin E / day.  Mammograms

## 2011-01-31 ENCOUNTER — Other Ambulatory Visit: Payer: Self-pay | Admitting: Internal Medicine

## 2011-01-31 ENCOUNTER — Ambulatory Visit
Admission: RE | Admit: 2011-01-31 | Discharge: 2011-01-31 | Disposition: A | Discharge status: Home / Self Care | Payer: Medicare Other | Source: Ambulatory Visit | Attending: Internal Medicine | Admitting: Internal Medicine

## 2011-01-31 DIAGNOSIS — N644 Mastodynia: Secondary | ICD-10-CM

## 2011-01-31 DIAGNOSIS — N6452 Nipple discharge: Secondary | ICD-10-CM

## 2011-02-10 ENCOUNTER — Other Ambulatory Visit: Payer: Self-pay | Admitting: Internal Medicine

## 2011-02-10 NOTE — Telephone Encounter (Signed)
Lipid/Hep 272.4/995.20  

## 2011-02-21 ENCOUNTER — Ambulatory Visit (INDEPENDENT_AMBULATORY_CARE_PROVIDER_SITE_OTHER): Payer: Medicare Other | Admitting: Endocrinology

## 2011-02-21 ENCOUNTER — Ambulatory Visit: Payer: Medicare Other | Admitting: Endocrinology

## 2011-02-21 ENCOUNTER — Other Ambulatory Visit (INDEPENDENT_AMBULATORY_CARE_PROVIDER_SITE_OTHER): Payer: Medicare Other

## 2011-02-21 ENCOUNTER — Encounter: Payer: Self-pay | Admitting: Endocrinology

## 2011-02-21 DIAGNOSIS — R7309 Other abnormal glucose: Secondary | ICD-10-CM

## 2011-02-21 LAB — BASIC METABOLIC PANEL
BUN: 13 mg/dL (ref 6–23)
GFR: 61.98 mL/min (ref >60.00)
Potassium: 4.5 mEq/L (ref 3.5–5.1)
Sodium: 142 mEq/L (ref 135–145)

## 2011-02-21 NOTE — Patient Instructions (Addendum)
blood tests are being ordered for you today.  please call [phone] to hear your test results.  You will be prompted to enter the 9-digit "MRN" number that appears at the top left of this page, followed by #.  Then you will hear the message. If these blood tests are good, Please return in 1 year. (update: i left message on phone-tree:  i advised diet and exercise.  Ret 6 mos)

## 2011-02-21 NOTE — Progress Notes (Signed)
Subjective:    Patient ID: [name], male    DOB: 04-Jun-1926, 77 y.o.   MRN: [id number]  HPI Pt ha been noted to have hyperglycemia. Pt was noted in the er in 2011 to have low ionized calcium. pt states he feels well in general, except for a few years of slight pain at the back of the neck, but no assoc numbness.   Past Medical History  Diagnosis Date  . DIVERTICULOSIS, COLON 10/07/2006  . HYPERLIPIDEMIA 10/07/2006  . HYPERTENSION 10/07/2006  . CANDIDIASIS, UROGENITAL SITES NEC 06/04/2007  . GERD 10/06/2008  . HYPERTROPHY PROSTATE W/UR OBST & OTH LUTS 06/03/2009  . TORTICOLLIS 10/27/2009  . Memory loss 08/12/2007  . Palpitations 03/22/2010  . ACTION TREMOR 08/12/2007  . Skin cancer   . Meralgia paraesthetica   . Syncope   . Urosepsis   . Prostatitis     Past Surgical History  Procedure Date  . Tonsillectomy   . Colonoscopy      Diverticulosis    History   Social History  . Marital Status: Married    Spouse Name: N/A    Number of Children: N/A  . Years of Education: N/A   Occupational History  . Retired    Social History Main Topics  . Smoking status: Former Smoker    Quit date: 08/21/1973  . Smokeless tobacco: Not on file  . Alcohol Use: Yes     occasional use  . Drug Use: No  . Sexually Active: Not on file   Other Topics Concern  . Not on file   Social History Narrative   No special diet.No regular exercise    Current Outpatient Prescriptions on File Prior to Visit  Medication Sig Dispense Refill  . acetaminophen (TYLENOL) 325 MG tablet Take 650 mg by mouth every 6 (six) hours as needed.        . metoprolol tartrate (LOPRESSOR) 25 MG tablet 1/2 by mouth two times daily      . Omega-3 Fatty Acids (FISH OIL) 1200 MG CAPS Take 1 capsule by mouth 2 (two) times daily.       Marland Kitchen omeprazole (PRILOSEC) 20 MG capsule Take 20 mg by mouth daily.        . pravastatin (PRAVACHOL) 40 MG tablet TAKE 1 TABLET AT BEDTIME  30 tablet  0  . valsartan (DIOVAN) 320 MG tablet  Take 1 tablet (320 mg total) by mouth as directed. 1/2 by mouth once daily  45 tablet  1  . finasteride (PROSCAR) 5 MG tablet Take 5 mg by mouth daily.        Marland Kitchen DISCONTD: ranitidine (ZANTAC) 150 MG tablet Take 150 mg by mouth 2 (two) times daily. Pre meals         Allergies  Allergen Reactions  . Ramipril   . Ranitidine   . Tamsulosin     Family History  Problem Relation Age of Onset  . Heart attack Father 66  . Colon cancer Mother 67  . Hypertension Mother   . Cancer Mother     Colon Cancer  . Breast cancer Maternal Grandmother   . Cancer Maternal Grandmother     Breast Cancer  . Heart attack Maternal Grandfather 60  . Heart failure Sister   . Heart disease Sister     CHF  NF:AOZH  BP 138/86  Pulse 74  Temp(Src) 98.9 F (37.2 C) (Oral)  Ht 5\' 9"  (1.753 m)  Wt 195 lb 12.8 oz (88.814 kg)  BMI  28.91 kg/m2  SpO2 96%  Review of Systems denies headache, chest pain, sob, n/v, excessive diaphoresis, memory loss, depression, hypoglycemia, easy bruising, and cramps.  He has lost a few lbs due to his efforts.  he sees urol for urinary frequency.  He reports slight blurry vision, rhinorrhea, and mild memory loss.  He says new glasses 2 weeks ago did not help.      Objective:   Physical Exam VS: see vs page GEN: no distress HEAD: head: no deformity eyes: no periorbital swelling, no proptosis external nose and ears are normal mouth: no lesion seen NECK: supple, thyroid is not enlarged CHEST WALL: no deformity BREASTS:  No gynecomastia CV: reg rate and rhythm, no murmur ABD: abdomen is soft, nontender.  no hepatosplenomegaly.  not distended.  no hernia MUSCULOSKELETAL: muscle bulk and strength are grossly normal.  no obvious joint swelling.  gait is normal and steady EXTEMITIES: no deformity.  no ulcer on the feet.  feet are of normal color and temp.  no edema.  There is bilteral onychomycosis PULSES: dorsalis pedis intact bilat.  no carotid bruit NEURO:  cn 2-12 grossly  intact.   readily moves all 4's.  sensation is intact to touch on the feet.  There is a coarse tremor of the hands.   SKIN:  Normal texture and temperature.  No rash or suspicious lesion is visible.   NODES:  None palpable at the neck PSYCH: alert, oriented x3.  Does not appear anxious nor depressed.    Lab Results  Component Value Date   HGBA1C 6.0 02/21/2011   Lab Results  Component Value Date   CALCIUM 9.4 02/21/2011   CAION 1.06* 04/17/2010   Assessment & Plan:  Hyperglycemia, worse Hypercalcemia, uncertain etiology.  Resolved. Neck pain, not related to the above.

## 2011-02-23 LAB — PTH, INTACT AND CALCIUM
Calcium, Total (PTH): 10.1 mg/dL (ref 8.4–10.5)
PTH: 39.5 pg/mL (ref 14.0–72.0)

## 2011-03-06 ENCOUNTER — Other Ambulatory Visit: Payer: Self-pay | Admitting: Internal Medicine

## 2011-03-06 MED ORDER — PRAVASTATIN SODIUM 40 MG PO TABS: 40.0000 mg | ORAL_TABLET | Freq: Every day | ORAL | Status: DC

## 2011-03-06 NOTE — Telephone Encounter (Signed)
Patient needs to schedule Lipid/Hep 272.4/995.20 (Fasting labs )

## 2011-03-10 ENCOUNTER — Other Ambulatory Visit: Payer: Self-pay | Admitting: Internal Medicine

## 2011-04-18 ENCOUNTER — Other Ambulatory Visit: Payer: Self-pay | Admitting: Surgery

## 2011-05-30 LAB — LIPASE, BLOOD
Lipase: 16
Lipase: 44

## 2011-05-30 LAB — COMPREHENSIVE METABOLIC PANEL
ALT: 163 — ABNORMAL HIGH
ALT: 52
ALT: 68 — ABNORMAL HIGH
AST: 150 — ABNORMAL HIGH
AST: 44 — ABNORMAL HIGH
Albumin: 2.4 — ABNORMAL LOW
Alkaline Phosphatase: 56
Alkaline Phosphatase: 59
Alkaline Phosphatase: 62
BUN: 11
CO2: 23
CO2: 25
Calcium: 8 — ABNORMAL LOW
Calcium: 8 — ABNORMAL LOW
Chloride: 105
GFR calc Af Amer: >60
GFR calc Af Amer: >60
GFR calc non Af Amer: >60
GFR calc non Af Amer: >60
Glucose, Bld: 126 — ABNORMAL HIGH
Potassium: 3.3 — ABNORMAL LOW
Potassium: 3.8
Potassium: 3.9
Sodium: 135
Sodium: 135
Sodium: 136
Total Bilirubin: 1.9 — ABNORMAL HIGH
Total Bilirubin: 3.4 — ABNORMAL HIGH
Total Protein: 4.9 — ABNORMAL LOW
Total Protein: 5 — ABNORMAL LOW
Total Protein: 5.2 — ABNORMAL LOW

## 2011-05-30 LAB — CBC
HCT: 39.5
Hemoglobin: 13.3
Hemoglobin: 15.1
MCHC: 33
MCHC: 33.6
MCHC: 33.6
MCV: 93.6
Platelets: 206
RBC: 4.24
RDW: 13.6
RDW: 13.6
RDW: 13.7
RDW: 14
WBC: 8.5

## 2011-05-30 LAB — URINALYSIS, ROUTINE W REFLEX MICROSCOPIC
Ketones, ur: NEGATIVE
Nitrite: NEGATIVE
Specific Gravity, Urine: 1.018
Urobilinogen, UA: 1
pH: 7

## 2011-05-30 LAB — I-STAT 8, (EC8 V) (CONVERTED LAB)
Acid-base deficit: 2
Chloride: 105
HCT: 49
Potassium: 3.9
Sodium: 138
pH, Ven: 7.348 — ABNORMAL HIGH

## 2011-05-30 LAB — HEPATIC FUNCTION PANEL
Albumin: 3.2 — ABNORMAL LOW
Indirect Bilirubin: 0.9
Total Bilirubin: 1.6 — ABNORMAL HIGH
Total Protein: 5.7 — ABNORMAL LOW

## 2011-05-30 LAB — POCT I-STAT CREATININE: Operator id: 294341

## 2011-05-30 LAB — DIFFERENTIAL
Basophils Absolute: 0
Lymphocytes Relative: 12
Monocytes Absolute: 0 — ABNORMAL LOW
Neutro Abs: 6.6
Neutrophils Relative %: 88 — ABNORMAL HIGH

## 2011-05-30 LAB — CULTURE, BLOOD (ROUTINE X 2): Culture: NO GROWTH

## 2011-05-30 LAB — POCT CARDIAC MARKERS: Operator id: 294341

## 2011-05-31 ENCOUNTER — Ambulatory Visit (INDEPENDENT_AMBULATORY_CARE_PROVIDER_SITE_OTHER): Payer: Medicare Other | Admitting: Internal Medicine

## 2011-05-31 ENCOUNTER — Encounter: Payer: Self-pay | Admitting: Internal Medicine

## 2011-05-31 VITALS — BP 136/80 | HR 72 | Temp 98.5°F | Wt 195.4 lb

## 2011-05-31 DIAGNOSIS — I6529 Occlusion and stenosis of unspecified carotid artery: Secondary | ICD-10-CM

## 2011-05-31 DIAGNOSIS — R319 Hematuria, unspecified: Secondary | ICD-10-CM

## 2011-05-31 DIAGNOSIS — R350 Frequency of micturition: Secondary | ICD-10-CM

## 2011-05-31 DIAGNOSIS — H539 Unspecified visual disturbance: Secondary | ICD-10-CM

## 2011-05-31 DIAGNOSIS — E785 Hyperlipidemia, unspecified: Secondary | ICD-10-CM

## 2011-05-31 LAB — POCT URINALYSIS DIPSTICK
Bilirubin, UA: NEGATIVE
Ketones, UA: NEGATIVE
Leukocytes, UA: NEGATIVE
Protein, UA: NEGATIVE
Spec Grav, UA: 1.01
pH, UA: 6

## 2011-05-31 MED ORDER — CIPROFLOXACIN HCL 500 MG PO TABS: 500.0000 mg | ORAL_TABLET | Freq: Two times a day (BID) | ORAL | Status: DC

## 2011-05-31 MED ORDER — NITROFURANTOIN MONOHYD MACRO 100 MG PO CAPS: 100.0000 mg | ORAL_CAPSULE | Freq: Two times a day (BID) | ORAL | Status: AC

## 2011-05-31 NOTE — Patient Instructions (Signed)
Please see an ophthalmologist to assess the blurred vision and double vision he had been experiencing. Please  schedule fasting Labs : [facility] Advanced Lipids(272.4, V17.3).  Please bring these instructions to that Lab appt.

## 2011-05-31 NOTE — Progress Notes (Signed)
Addended byMarga Melnick F on: 05/31/2011 04:59 PM   Modules accepted: Orders

## 2011-05-31 NOTE — Progress Notes (Signed)
  Subjective:    Patient ID: [name], male    DOB: 04-20-1926, 77 y.o.   MRN: [id number]  HPI Frequency: Onset: 6 months    Worsening: worse , nocturia every 90 minutes. He has seen Dr.wrenn, urologist for dysuria in June 2012. He is unsure of the diagnosis for which he was treated.  Symptoms Urgency: yes,   Hesitancy: no  Hematuria: no  Flank Pain: no,   Fever: no     Nausea/Vomiting: no  Dysuria: occasionally Discharge: no Red Flags  : (Risk Factors for Complicated UTI) Recent Antibiotic Usage (last 30 days): no  Symptoms lasting more than seven (7) days: yes  More than 3 UTI's last 12 months: no  PMH of  1. DM: no 2. Renal Disease/Calculi: no 3. Urinary Tract Abnormality: no  4. Instrumentation/Trauma: no  Significant past medical history includes prostatitis in 2007 complicated by syncope and urosepsis.    Review of Systems   He has stopped his statins; he states it caused blurred vision and intermittent diplopia. Significantly he has a past medical history of carotid artery disease . His family history of myocardial infarction in his father at 46 and maternal grandfather in his late 4s. Additionally he describes constipation and and urgency of stool.     Objective:   Physical Exam General appearance is one of good  nourishment ; in no  distress.  Eyes: No conjunctival inflammation or scleral icterus is present.  Oral exam: Dental hygiene is good; lips and gums are healthy appearing.There is mild  oropharyngeal erythema w/o exudate noted.   Heart:  Normal rate and regular rhythm. S1 and S2 normal without gallop, murmur, click, rub .S4  Lungs:Chest clear to auscultation; no wheezes, rhonchi,rales ,or rubs present.No increased work of breathing.   Abdomen: bowel sounds normal, soft and non-tender without masses, organomegaly or hernias noted.  No guarding or rebound   Skin:Warm & dry.  Intact without suspicious lesions or rashes ; no jaundice or  tenting Neuro: oriented X 3; tremor of hands  GU/DRE: No significant genital abnormalities. Prostate is upper limits of normal and mildly asymmetric. Clinically there is no evidence of prostatitis.  Lymphatic: No lymphadenopathy is noted about the head, neck, axilla, or inguinal areas.              Assessment & Plan:  #1 frequency ; clinically no evidence of prostatitis. Microscopic hematuria on dip urine. Culture pending. Past medical history of prostatitis with urosepsis and syncope 2007.  #2 visual changes with blurred vision double vision; he attributed this to a statin and stopped it. Plan: See orders and recommendations Plan: See orders and recommendations

## 2011-06-01 LAB — PSA: PSA: 0.35 ng/mL (ref 0.10–4.00)

## 2011-06-02 LAB — URINE CULTURE: Colony Count: NO GROWTH

## 2011-06-06 ENCOUNTER — Emergency Department (HOSPITAL_COMMUNITY)
Admission: EM | Admit: 2011-06-06 | Discharge: 2011-06-06 | Disposition: A | Discharge status: Home / Self Care | Payer: Medicare Other | Attending: Emergency Medicine | Admitting: Emergency Medicine

## 2011-06-06 DIAGNOSIS — N390 Urinary tract infection, site not specified: Secondary | ICD-10-CM | POA: Insufficient documentation

## 2011-06-06 DIAGNOSIS — R3911 Hesitancy of micturition: Secondary | ICD-10-CM | POA: Insufficient documentation

## 2011-06-06 DIAGNOSIS — R109 Unspecified abdominal pain: Secondary | ICD-10-CM | POA: Insufficient documentation

## 2011-06-06 DIAGNOSIS — R300 Dysuria: Secondary | ICD-10-CM | POA: Insufficient documentation

## 2011-06-06 DIAGNOSIS — I10 Essential (primary) hypertension: Secondary | ICD-10-CM | POA: Insufficient documentation

## 2011-06-06 LAB — DIFFERENTIAL
Basophils Absolute: 0 10*3/uL (ref 0.0–0.1)
Eosinophils Relative: 3 % (ref 0–5)
Lymphocytes Relative: 36 % (ref 12–46)
Lymphs Abs: 2.5 10*3/uL (ref 0.7–4.0)
Monocytes Absolute: 0.6 10*3/uL (ref 0.1–1.0)
Monocytes Relative: 9 % (ref 3–12)
Neutro Abs: 3.6 10*3/uL (ref 1.7–7.7)

## 2011-06-06 LAB — CBC
HCT: 46 % (ref 39.0–52.0)
Hemoglobin: 15.5 g/dL (ref 13.0–17.0)
MCH: 31.6 pg (ref 26.0–34.0)
MCHC: 33.7 g/dL (ref 30.0–36.0)
MCV: 93.9 fL (ref 78.0–100.0)
RDW: 13.7 % (ref 11.5–15.5)

## 2011-06-06 LAB — BASIC METABOLIC PANEL
BUN: 14 mg/dL (ref 6–23)
Creatinine, Ser: 0.99 mg/dL (ref 0.50–1.35)
GFR calc Af Amer: 85 mL/min — ABNORMAL LOW (ref >90)
GFR calc non Af Amer: 74 mL/min — ABNORMAL LOW (ref >90)
Glucose, Bld: 117 mg/dL — ABNORMAL HIGH (ref 70–99)
Potassium: 3.5 mEq/L (ref 3.5–5.1)

## 2011-06-06 LAB — URINALYSIS, ROUTINE W REFLEX MICROSCOPIC
Glucose, UA: NEGATIVE mg/dL
Ketones, ur: NEGATIVE mg/dL
Nitrite: NEGATIVE
Protein, ur: 100 mg/dL — AB

## 2011-06-06 LAB — URINE MICROSCOPIC-ADD ON

## 2011-06-07 ENCOUNTER — Telehealth: Payer: Self-pay | Admitting: Internal Medicine

## 2011-06-07 LAB — URINE CULTURE: Culture  Setup Time: 201210170119

## 2011-06-16 ENCOUNTER — Other Ambulatory Visit: Payer: Self-pay | Admitting: Internal Medicine

## 2011-06-16 MED ORDER — VALSARTAN 320 MG PO TABS: 320.0000 mg | ORAL_TABLET | ORAL | Status: DC

## 2011-06-16 NOTE — Telephone Encounter (Signed)
RX sent

## 2011-06-23 ENCOUNTER — Other Ambulatory Visit: Payer: Self-pay | Admitting: Internal Medicine

## 2011-06-23 NOTE — Telephone Encounter (Signed)
Rx sent to pharmacy 06/16/11.

## 2011-06-28 ENCOUNTER — Other Ambulatory Visit: Payer: Self-pay | Admitting: Urology

## 2011-06-28 ENCOUNTER — Ambulatory Visit (HOSPITAL_COMMUNITY)
Admission: RE | Admit: 2011-06-28 | Discharge: 2011-06-28 | Disposition: A | Discharge status: Home / Self Care | Payer: Medicare Other | Source: Ambulatory Visit | Attending: Urology | Admitting: Urology

## 2011-06-28 DIAGNOSIS — C672 Malignant neoplasm of lateral wall of bladder: Secondary | ICD-10-CM

## 2011-06-30 ENCOUNTER — Other Ambulatory Visit: Payer: Self-pay | Admitting: Urology

## 2011-06-30 MED ORDER — MITOMYCIN CHEMO FOR BLADDER INSTILLATION 40 MG: 40.0000 mg | Freq: Once | INTRAVENOUS | Status: DC

## 2011-07-19 ENCOUNTER — Encounter (HOSPITAL_COMMUNITY): Payer: Self-pay | Admitting: Pharmacy Technician

## 2011-07-24 ENCOUNTER — Other Ambulatory Visit: Payer: Self-pay

## 2011-07-24 ENCOUNTER — Encounter (HOSPITAL_COMMUNITY)
Admission: RE | Admit: 2011-07-24 | Discharge: 2011-07-24 | Disposition: A | Discharge status: Home / Self Care | Payer: Medicare Other | Source: Ambulatory Visit | Attending: Urology | Admitting: Urology

## 2011-07-24 ENCOUNTER — Encounter (HOSPITAL_COMMUNITY): Payer: Self-pay

## 2011-07-24 LAB — CBC
MCH: 32 pg (ref 26.0–34.0)
MCHC: 34.5 g/dL (ref 30.0–36.0)
MCV: 93 fL (ref 78.0–100.0)
Platelets: 193 10*3/uL (ref 150–400)
RDW: 13.5 % (ref 11.5–15.5)
WBC: 7.1 10*3/uL (ref 4.0–10.5)

## 2011-07-24 LAB — BASIC METABOLIC PANEL
CO2: 24 mEq/L (ref 19–32)
Calcium: 9.3 mg/dL (ref 8.4–10.5)
Creatinine, Ser: 1.02 mg/dL (ref 0.50–1.35)

## 2011-07-24 NOTE — Patient Instructions (Signed)
[name]  07/24/2011   Your procedure is scheduled on: 07-27-11  Report to [facility] at 0800 AM.  Call this number if you have problems the morning of surgery: [phone]   Remember:   Do not eat food:After Midnight.  May have clear liquids:until Midnight .  Clear liquids include soda, tea, black coffee, apple or grape juice, broth.  Take these medicines the morning of surgery with A SIP OF WATER: Metoprolol, Omeprazole   Do not wear jewelry, make-up or nail polish.  Do not wear lotions, powders, or perfumes. You may wear deodorant.  Do not shave 48 hours prior to surgery.  Do not bring valuables to the hospital.  Contacts, dentures or bridgework may not be worn into surgery.  Leave suitcase in the car. After surgery it may be brought to your room.  For patients admitted to the hospital, checkout time is 11:00 AM the day of discharge.   Patients discharged the day of surgery will not be allowed to drive home.  Name and phone number of your driver: "Dedra Skeens", spouse [phone]  Special Instructions: CHG Shower Use Special Wash: 1/2 bottle night before surgery and 1/2 bottle morning of surgery.   Please read over the following fact sheets that you were given: MRSA Information

## 2011-07-24 NOTE — Pre-Procedure Instructions (Signed)
07-24-11 EKG done today. CXR report (06-28-11) with chart.

## 2011-07-26 NOTE — H&P (Signed)
Problem list:  1. Feelings Of Urinary Urgency 788.63 2. Malignant Neoplasm Of The Lateral Wall Of The Bladder 188.2 3. Malignant Neoplasm Of The Neck Of The Bladder 188.5 4. Malignant Neoplasm Of The Posterior Wall Of The Bladder 188.4 5. Nocturia 788.43 6. History of  Prostatitis 601.9  History of Present Illness   [name] returns today for a TURBT for multifocal bladder cancer.  Found on recent evaluation for increased voiding complaints.   Past Medical History Problems  1. History of  Esophageal Reflux 530.81 2. History of  Hypercholesterolemia 272.0 3. History of  Hypertension 401.9 4. History of  Prostatitis 601.9 5. History of  Urinary Tract Infection V13.02 6. History of  Vertigo 780.4  Surgical History Problems  1. History of  Cholecystectomy  Current Meds 1. Diovan CAPS; Therapy: (Recorded:28Apr2011) to 2. Fish Oil CAPS; Therapy: (Recorded:28Apr2011) to 3. Metoprolol Tartrate 50 MG Oral Tablet; Therapy: (Recorded:28Apr2011) to 4. Ranitidine HCl 150 MG Oral Tablet; Therapy: (Recorded:30Aug2011) to 5. Tylenol TABS; Therapy: (Recorded:30Aug2011) to 6. Vitamin E TABS; Therapy: (Recorded:27Jun2012) to  Allergies Medication  1. Cephalexin CAPS  Family History Problems  1. Maternal history of  Colon Cancer V16.0 2. Family history of  Death In The Family Father deceased age 64--heart 3. Family history of  Death In The Family Mother deceased age 42--colon cancer 4. Maternal history of  Family Health Status Number Of Children 1 son 2 daughters  Social History Problems  1. Marital History - Currently Married 2. Retired From Work 3. Tobacco Use V15.82 smoked for 1yrs quit 30 yrs ago Denied  4. History of  Alcohol Use 5. History of  Caffeine Use  Review of Systems Genitourinary, constitutional, skin, eye, otolaryngeal, hematologic/lymphatic, cardiovascular, pulmonary, endocrine, musculoskeletal, gastrointestinal, neurological and psychiatric system(s) were  reviewed and pertinent findings if present are noted.  Genitourinary: urinary frequency and weak urinary stream.  Gastrointestinal: constipation   The patient presents with complaints of diarrhea (and gas).  Constitutional: no fever and no recent weight loss.  Cardiovascular: no chest pain.  Respiratory: no shortness of breath.  Endocrine: gynecomastia (but improved).    Vitals Vital Signs [Data Includes: Last 1 Day]  07Nov2012 02:27PM  Blood Pressure: 126 / 83 Temperature: 97.6 F Heart Rate: 82  Physical Exam Constitutional: Well nourished and well developed . No acute distress.  Pulmonary: No respiratory distress and normal respiratory rhythm and effort.  Cardiovascular: Heart rate and rhythm are normal . No peripheral edema.  Abdomen: The abdomen is soft and nontender. No masses are palpated. No CVA tenderness. No hernias are palpable. No hepatosplenomegaly noted.  Rectal: Rectal exam demonstrates normal sphincter tone, no tenderness and no masses. Estimated prostate size is 2+. The prostate has no nodularity and is not tender. The left seminal vesicle is nonpalpable. The right seminal vesicle is nonpalpable. The perineum is normal on inspection.    Procedure  Procedure: Cystoscopy  Indication: Hematuria. Lower Urinary Tract Symptoms.  Informed Consent: Risks, benefits, and potential adverse events were discussed and informed consent was obtained from the patient.  Prep: The patient was prepped with betadine.  Procedure Note:  Urethral meatus:. No abnormalities.  Anterior urethra:. A mild stricture was present (filamentous) in the membranous urethra.  Prostatic urethra:. There was visual obstruction of the prostatic urethra. (with diffuse papillary fronds that are consistent with TCCa. ).  Bladder: Visulization was clear. The ureteral orifices were in the normal anatomic position bilaterally and had clear efflux of urine. Examination of the bladder demonstrated moderate  trabeculation. (He has extensive multifocal bladder tumors of the posterior wall, right lateral wall, trigone and bladder neck extending into the prostatitic urethra) The patient tolerated the procedure well.  Complications: None.    Assessment Assessed  1. Malignant Neoplasm Of The Lateral Wall Of The Bladder 188.2 2. Malignant Neoplasm Of The Neck Of The Bladder 188.5 3. Malignant Neoplasm Of The Posterior Wall Of The Bladder 188.4    He has extensive bladder tumors of the right bladder wall and prostatic urethra.   Plan Benign Prostatic Hypertrophy With Urinary Obstruction (600.01)  1. Complex Uroflowmetry 2. Cysto  Done: 07Nov2012 Malignant Neoplasm Of The Lateral Wall Of The Bladder (188.2)  3. Follow-up Schedule Surgery Office  Follow-up  Done: 07Nov2012    I am going to get a CT scan to evaluate the upper tracts and a CXR to stage him. He will be set up for a TURBT in the near future.  I will consider [facility] depending on the resection.  I reviewed the risks of bleeding, infection, bladder wall and ureteral injury, fluid overload, chemical cystitis, thrombotic events and anesthetic complications.  Because of the extent of the tumor, he will likely require a 2 day stay.

## 2011-07-27 ENCOUNTER — Other Ambulatory Visit: Payer: Self-pay | Admitting: Urology

## 2011-07-27 ENCOUNTER — Encounter (HOSPITAL_COMMUNITY)
Admission: RE | Disposition: A | Discharge status: Home / Self Care | Payer: Self-pay | Source: Ambulatory Visit | Attending: Urology

## 2011-07-27 ENCOUNTER — Encounter (HOSPITAL_COMMUNITY): Payer: Self-pay | Admitting: Anesthesiology

## 2011-07-27 ENCOUNTER — Ambulatory Visit (HOSPITAL_COMMUNITY): Payer: Medicare Other | Admitting: Anesthesiology

## 2011-07-27 ENCOUNTER — Observation Stay (HOSPITAL_COMMUNITY)
Admission: RE | Admit: 2011-07-27 | Discharge: 2011-07-28 | Disposition: A | Discharge status: Home / Self Care | Payer: Medicare Other | Source: Ambulatory Visit | Attending: Urology | Admitting: Urology

## 2011-07-27 ENCOUNTER — Encounter (HOSPITAL_COMMUNITY): Payer: Self-pay | Admitting: *Deleted

## 2011-07-27 DIAGNOSIS — D090 Carcinoma in situ of bladder: Secondary | ICD-10-CM | POA: Insufficient documentation

## 2011-07-27 DIAGNOSIS — E7800 Pure hypercholesterolemia, unspecified: Secondary | ICD-10-CM | POA: Insufficient documentation

## 2011-07-27 DIAGNOSIS — I10 Essential (primary) hypertension: Secondary | ICD-10-CM | POA: Insufficient documentation

## 2011-07-27 DIAGNOSIS — C680 Malignant neoplasm of urethra: Secondary | ICD-10-CM | POA: Insufficient documentation

## 2011-07-27 DIAGNOSIS — Z01810 Encounter for preprocedural cardiovascular examination: Secondary | ICD-10-CM | POA: Insufficient documentation

## 2011-07-27 DIAGNOSIS — Z01812 Encounter for preprocedural laboratory examination: Secondary | ICD-10-CM | POA: Insufficient documentation

## 2011-07-27 DIAGNOSIS — K219 Gastro-esophageal reflux disease without esophagitis: Secondary | ICD-10-CM | POA: Insufficient documentation

## 2011-07-27 DIAGNOSIS — Z79899 Other long term (current) drug therapy: Secondary | ICD-10-CM | POA: Insufficient documentation

## 2011-07-27 DIAGNOSIS — C679 Malignant neoplasm of bladder, unspecified: Secondary | ICD-10-CM

## 2011-07-27 HISTORY — PX: TRANSURETHRAL RESECTION OF BLADDER TUMOR: SHX2575

## 2011-07-27 SURGERY — TURBT (TRANSURETHRAL RESECTION OF BLADDER TUMOR)
Anesthesia: General | Site: Bladder | Wound class: Clean Contaminated

## 2011-07-27 MED ORDER — HYOSCYAMINE SULFATE 0.125 MG SL SUBL
0.1250 mg | SUBLINGUAL_TABLET | SUBLINGUAL | Status: DC | PRN
  Filled 2011-07-27: qty 1

## 2011-07-27 MED ORDER — METOPROLOL TARTRATE 12.5 MG HALF TABLET
12.5000 mg | ORAL_TABLET | Freq: Two times a day (BID) | ORAL | Status: DC
  Administered 2011-07-27 – 2011-07-28 (×2): 12.5 mg via ORAL
  Filled 2011-07-27 (×3): qty 1

## 2011-07-27 MED ORDER — MIDAZOLAM HCL 5 MG/5ML IJ SOLN
INTRAMUSCULAR | Status: DC | PRN
  Administered 2011-07-27: 1 mg via INTRAVENOUS

## 2011-07-27 MED ORDER — DOCUSATE SODIUM 100 MG PO CAPS
100.0000 mg | ORAL_CAPSULE | Freq: Two times a day (BID) | ORAL | Status: DC
  Administered 2011-07-27 – 2011-07-28 (×2): 100 mg via ORAL
  Filled 2011-07-27 (×3): qty 1

## 2011-07-27 MED ORDER — NEOSTIGMINE METHYLSULFATE 1 MG/ML IJ SOLN
INTRAMUSCULAR | Status: DC | PRN
  Administered 2011-07-27: 3 mg via INTRAVENOUS

## 2011-07-27 MED ORDER — HYOSCYAMINE SULFATE 0.125 MG SL SUBL: 0.1250 mg | SUBLINGUAL_TABLET | SUBLINGUAL | Status: AC | PRN

## 2011-07-27 MED ORDER — LACTATED RINGERS IV SOLN: INTRAVENOUS | Status: DC

## 2011-07-27 MED ORDER — FENTANYL CITRATE 0.05 MG/ML IJ SOLN
INTRAMUSCULAR | Status: DC | PRN
  Administered 2011-07-27 (×2): 50 ug via INTRAVENOUS
  Administered 2011-07-27 (×2): 25 ug via INTRAVENOUS
  Administered 2011-07-27: 50 ug via INTRAVENOUS

## 2011-07-27 MED ORDER — CIPROFLOXACIN IN D5W 400 MG/200ML IV SOLN
400.0000 mg | INTRAVENOUS | Status: AC
  Administered 2011-07-27: 400 mg via INTRAVENOUS

## 2011-07-27 MED ORDER — STERILE WATER FOR IRRIGATION IR SOLN
Status: DC | PRN
  Administered 2011-07-27: 15000 mL

## 2011-07-27 MED ORDER — EPHEDRINE SULFATE 50 MG/ML IJ SOLN
INTRAMUSCULAR | Status: DC | PRN
  Administered 2011-07-27: 10 mg via INTRAVENOUS

## 2011-07-27 MED ORDER — GLYCOPYRROLATE 0.2 MG/ML IJ SOLN
INTRAMUSCULAR | Status: DC | PRN
  Administered 2011-07-27: .4 mg via INTRAVENOUS

## 2011-07-27 MED ORDER — SUCCINYLCHOLINE CHLORIDE 20 MG/ML IJ SOLN
INTRAMUSCULAR | Status: DC | PRN
  Administered 2011-07-27: 100 mg via INTRAVENOUS

## 2011-07-27 MED ORDER — ONDANSETRON HCL 4 MG/2ML IJ SOLN
INTRAMUSCULAR | Status: DC | PRN
  Administered 2011-07-27 (×2): 2 mg via INTRAVENOUS

## 2011-07-27 MED ORDER — PHENAZOPYRIDINE HCL 200 MG PO TABS: 200.0000 mg | ORAL_TABLET | Freq: Three times a day (TID) | ORAL | Status: AC | PRN

## 2011-07-27 MED ORDER — LIDOCAINE HCL (CARDIAC) 20 MG/ML IV SOLN
INTRAVENOUS | Status: DC | PRN
  Administered 2011-07-27: 20 mg via INTRAVENOUS

## 2011-07-27 MED ORDER — POTASSIUM CHLORIDE IN NACL 20-0.45 MEQ/L-% IV SOLN
INTRAVENOUS | Status: DC
  Administered 2011-07-27 (×2): via INTRAVENOUS
  Filled 2011-07-27 (×7): qty 1000

## 2011-07-27 MED ORDER — ZOLPIDEM TARTRATE 5 MG PO TABS: 5.0000 mg | ORAL_TABLET | Freq: Every evening | ORAL | Status: DC | PRN

## 2011-07-27 MED ORDER — CIPROFLOXACIN HCL 250 MG PO TABS: 250.0000 mg | ORAL_TABLET | Freq: Two times a day (BID) | ORAL | Status: AC

## 2011-07-27 MED ORDER — CISATRACURIUM BESYLATE 2 MG/ML IV SOLN
INTRAVENOUS | Status: DC | PRN
  Administered 2011-07-27: 2 mg via INTRAVENOUS
  Administered 2011-07-27: 8 mg via INTRAVENOUS

## 2011-07-27 MED ORDER — ONDANSETRON HCL 4 MG/2ML IJ SOLN: 4.0000 mg | INTRAMUSCULAR | Status: DC | PRN

## 2011-07-27 MED ORDER — ACETAMINOPHEN 325 MG PO TABS
650.0000 mg | ORAL_TABLET | ORAL | Status: DC | PRN
  Administered 2011-07-27: 650 mg via ORAL
  Filled 2011-07-27: qty 2

## 2011-07-27 MED ORDER — CIPROFLOXACIN HCL 250 MG PO TABS
250.0000 mg | ORAL_TABLET | Freq: Two times a day (BID) | ORAL | Status: DC
  Administered 2011-07-27 – 2011-07-28 (×2): 250 mg via ORAL
  Filled 2011-07-27 (×4): qty 1

## 2011-07-27 MED ORDER — INFLUENZA VIRUS VACC SPLIT PF IM SUSP
0.5000 mL | INTRAMUSCULAR | Status: AC
  Filled 2011-07-27: qty 0.5

## 2011-07-27 MED ORDER — FENTANYL CITRATE 0.05 MG/ML IJ SOLN
25.0000 ug | INTRAMUSCULAR | Status: DC | PRN
  Administered 2011-07-27 (×2): 50 ug via INTRAVENOUS

## 2011-07-27 MED ORDER — HYDROCODONE-ACETAMINOPHEN 5-325 MG PO TABS: 1.0000 | ORAL_TABLET | ORAL | Status: DC | PRN

## 2011-07-27 MED ORDER — PANTOPRAZOLE SODIUM 40 MG PO TBEC
40.0000 mg | DELAYED_RELEASE_TABLET | Freq: Every day | ORAL | Status: DC
  Administered 2011-07-27 – 2011-07-28 (×2): 40 mg via ORAL
  Filled 2011-07-27: qty 1

## 2011-07-27 MED ORDER — KETAMINE HCL 10 MG/ML IJ SOLN
INTRAMUSCULAR | Status: DC | PRN
  Administered 2011-07-27 (×2): 5 mg via INTRAVENOUS

## 2011-07-27 MED ORDER — LACTATED RINGERS IV SOLN
INTRAVENOUS | Status: DC
  Administered 2011-07-27: 1000 mL via INTRAVENOUS

## 2011-07-27 MED ORDER — OLMESARTAN MEDOXOMIL 40 MG PO TABS
40.0000 mg | ORAL_TABLET | Freq: Every day | ORAL | Status: DC
  Administered 2011-07-27 – 2011-07-28 (×2): 40 mg via ORAL
  Filled 2011-07-27 (×2): qty 1

## 2011-07-27 MED ORDER — LACTATED RINGERS IV SOLN: INTRAVENOUS | Status: DC | PRN

## 2011-07-27 MED ORDER — PROMETHAZINE HCL 25 MG/ML IJ SOLN: 6.2500 mg | INTRAMUSCULAR | Status: DC | PRN

## 2011-07-27 MED ORDER — PROPOFOL 10 MG/ML IV EMUL
INTRAVENOUS | Status: DC | PRN
  Administered 2011-07-27: 160 mg via INTRAVENOUS
  Administered 2011-07-27 (×2): 10 mg via INTRAVENOUS

## 2011-07-27 MED ORDER — DOCUSATE SODIUM 100 MG PO CAPS: 100.0000 mg | ORAL_CAPSULE | Freq: Two times a day (BID) | ORAL | Status: AC

## 2011-07-27 MED ORDER — MORPHINE SULFATE 2 MG/ML IJ SOLN
2.0000 mg | INTRAMUSCULAR | Status: DC | PRN
  Administered 2011-07-28: 2 mg via INTRAVENOUS
  Filled 2011-07-27: qty 1

## 2011-07-27 SURGICAL SUPPLY — 27 items
BAG URINE DRAINAGE (UROLOGICAL SUPPLIES) ×1 IMPLANT
BAG URO CATCHER STRL LF (DRAPE) ×2 IMPLANT
CATH AINSWORTH 30CC 24FR (CATHETERS) ×1 IMPLANT
CATH FOLEY 3WAY 30CC 22FR (CATHETERS) IMPLANT
CLOTH BEACON ORANGE TIMEOUT ST (SAFETY) ×2 IMPLANT
DRAPE CAMERA CLOSED 9X96 (DRAPES) ×2 IMPLANT
ELECT REM PT RETURN 9FT ADLT (ELECTROSURGICAL) ×2
ELECTRODE KNIFE URO 27FR PED (UROLOGICAL SUPPLIES) ×1 IMPLANT
ELECTRODE REM PT RTRN 9FT ADLT (ELECTROSURGICAL) ×1 IMPLANT
GLOVE SURG SS PI 8.0 STRL IVOR (GLOVE) ×2 IMPLANT
GOWN PREVENTION PLUS XLARGE (GOWN DISPOSABLE) ×2 IMPLANT
GOWN STRL REIN XL XLG (GOWN DISPOSABLE) ×2 IMPLANT
GUIDEWIRE STR DUAL SENSOR (WIRE) ×1 IMPLANT
HOLDER FOLEY CATH W/STRAP (MISCELLANEOUS) ×1 IMPLANT
KIT ASPIRATION TUBING (SET/KITS/TRAYS/PACK) ×2 IMPLANT
KNIFE COLLINS 24FR (ELECTROSURGICAL) IMPLANT
LOOPS CUTTING 24FR (ELECTROSURGICAL) IMPLANT
LOOPS RESECTOSCOPE DISP (ELECTROSURGICAL) ×2 IMPLANT
MANIFOLD NEPTUNE II (INSTRUMENTS) ×2 IMPLANT
MARKER SKIN DUAL TIP RULER LAB (MISCELLANEOUS) ×1 IMPLANT
NS IRRIG 1000ML POUR BTL (IV SOLUTION) ×1 IMPLANT
PACK CYSTO (CUSTOM PROCEDURE TRAY) ×2 IMPLANT
ROLLER BALL 3MM 27FR (ELECTROSURGICAL) IMPLANT
SUT ETHILON 3 0 PS 1 (SUTURE) IMPLANT
SYR 30ML LL (SYRINGE) ×1 IMPLANT
SYRINGE IRR TOOMEY STRL 70CC (SYRINGE) IMPLANT
TUBING CONNECTING 10 (TUBING) ×2 IMPLANT

## 2011-07-27 NOTE — Progress Notes (Signed)
Mr. 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   He is comfortable and his urine is pink.  He will need to have the catheter for several days but may be able to go home tomorrow if the urine is clear.

## 2011-07-27 NOTE — Op Note (Signed)
NAMEJOREY, DOLLARD NO.:  [id number]  MEDICAL RECORD NO.:  [id number]  LOCATION:  [facility]                         FACILITY:  [facility]  PHYSICIAN:  Excell Seltzer. Annabell Howells, M.D.    DATE OF BIRTH:  13-Feb-1926  DATE OF PROCEDURE:  07/27/2011 DATE OF DISCHARGE:                              OPERATIVE REPORT   PROCEDURE:  Cystoscopy, transurethral resection of large bladder tumor.  PREOPERATIVE DIAGNOSIS:  Large bladder tumor.  POSTOPERATIVE DIAGNOSIS:  Large bladder tumor.  SURGEON:  Excell Seltzer. Annabell Howells, M.D.  ANESTHESIA:  General.  SPECIMEN:  Bladder tumor from prostatic urethra dome and right lateral wall.  DRAIN:  24-French Ainsworth Foley catheter.  COMPLICATIONS:  Urethral false passage.  INDICATIONS:  Mr. Baylock is an 77 year old white male who was found to have increased irritative voiding symptoms.  An office cystoscopy demonstrated a multifocal bladder tumor involving the prostatic urethra as well as the anterior and right lateral bladder wall.  There was erythema of the bulk of the bladder.  It was felt that resection was indicated.  CT scan revealed the tumors in the bladder, but no obvious metastatic disease.  The patient was taken to the operating room.  He received Cipro.  A general anesthetic was induced.  He was fitted with PAS hose and was placed in lithotomy position.  His perineum and genitalia were prepped with Betadine solution and was draped in usual sterile fashion.  Cystoscopy was performed using a 22-French scope and initially 12-degree lens.  Examination revealed a normal urethra, however, there was a little bit of stricture at the membranous urethra.  The external sphincter was intact.  The prostatic urethra appeared to have evidence of prior resection and was filled with papillary fronds, particularly at the bladder neck.  The prostatic urethra was approximately 2 cm in length.  After inspecting the prostatic urethra, the bladder was  inspected.  He was noted to have diffuse erythema with a bubbly appearance of the mucosa, worrisome for carcinoma in situ that involved essentially the entire bladder wall.  There were areas of papillary tumor, particularly at the anterior/dome of the bladder that measured approximately 3-cm in cross, but this was contiguous with abnormal mucosa with some papillary fronds at the left posterior wall/dome and the right lateral wall. Additionally, there were spreading papillary fronds that extensively involved the right bladder neck and trigone, the right lateral wall and the dome.  The left ureteral orifice was unremarkable.  The right ureteral orifice was not readily identified.  After cystoscopy, the scope was removed and a 24-French [employer] sound was placed, however, this sounded not passed easily and repeat cystoscopy after passage of the sound indicated evidence of an anterior false passage.  At this point, attempt to replace the cystoscope was quite difficult, however, was able to negotiate a guidewire into the bladder.  This was confirmed fluoroscopically.  Once the guidewire was in place, Lavalette slipover sounds were used to dilate the urethra to 30- [place].  At this point, I was able to reinsert the cystoscope over the wire and felt the opening was sufficiently patent for passage of the 28-French continuous flow resectoscope sheath.  This was  passed alongside the wire with the obturator and initially was not able to access the bladder, however, the 12-degree lens with the [facility] handle and monopolar loop was inserted in the resectoscope, and I was able to advance it alongside the wire under direct vision.  Once the scope was in the bladder, the guidewire was removed.  I then resected using water for irrigant the tumors from the prostatic urethra.  They involve circumferentially the prostatic urethra with the bulk at the bladder neck, however, resection was carried out to  and alongside the verumontanum.  Once the prostatic involvement had been resected, the bladder tumors were dealt with.  Initially, the anterior bladder wall tumor was resected at the muscular fibers and hemostasis was achieved.  These chips were removed.  The prostatic chips had been collected separately for analysis.  The anterior bladder wall chips were then removed and collected separately for analysis.  A small tumor on the right lateral wall was resected as well as one on the left dome.  Once these had been resected, a generous fulguration was performed as much of the prominent papillary bladder mucosal changes as possible, however, the mucosal changes were very extensive and I was then able to completely fulgurate the bladder mucosa.  Once the bulky lesions were removed with contiguous involvement in excess of 5 cm, I felt that further cauterization and fulguration would not be safe and fruitless.  At this point, final hemostasis was achieved.  Inspection revealed no residual chips or clots.  A guidewire was passed through the resectoscope sheath.  The resectoscope sheath was removed and a 24- [place] Ainsworth catheter was inserted into the bladder.  The balloon was filled with 30 cc of sterile fluid.  The catheter was held on traction and irrigated with clear return.  It was secured to the patient's left thigh on traction.  At this point, the patient was taken down from lithotomy position.  His anesthetic was reversed.  He was moved to recovery room in stable condition.  The only complication was the urethral false passage for which he will need to keep his catheter for several days.     Excell Seltzer. Annabell Howells, M.D.     JJW/MEDQ  D:  07/27/2011  T:  07/27/2011  Job:  [id number]  cc:   Titus Dubin. Alwyn Ren, MD,FACP,FCCP [phone] [place], [place] [address]

## 2011-07-27 NOTE — Progress Notes (Signed)
Clots noted in catheter bag and foley line. Bladder irrigation initiated with moderate amount of small clots noted. Pt tolerated irrigation well. Will continue to monitor. [name]

## 2011-07-27 NOTE — Anesthesia Postprocedure Evaluation (Signed)
,   Anesthesia Post Note  Patient: [name]  Procedure(s) Performed:  TRANSURETHRAL RESECTION OF BLADDER TUMOR (TURBT) - Cystoscopy/Transurethral Resection of Bladder Tumor  Anesthesia type: General  Patient location: PACU  Post pain: Pain level controlled  Post assessment: Post-op Vital signs reviewed  Last Vitals:  Filed Vitals:   07/27/11 1300  BP: 118/64  Pulse: 97  Temp: 36.3 C  Resp: 22    Post vital signs: Reviewed  Level of consciousness: sedated  Complications: No apparent anesthesia complications

## 2011-07-27 NOTE — Progress Notes (Signed)
Irrigate foley again with small amount of large clots return. Urine is now light red. Pt states that his pain is better after irrigation. Will continue to monitor. Jessee Avers

## 2011-07-27 NOTE — Interval H&P Note (Signed)
History and Physical Interval Note:  07/27/2011 7:46 AM  [name]  has presented today for surgery, with the diagnosis of Large Bladder Tumor  The various methods of treatment have been discussed with the patient and family. After consideration of risks, benefits and other options for treatment, the patient has consented to  Procedure(s): TRANSURETHRAL RESECTION OF BLADDER TUMOR (TURBT) as a surgical intervention .  The patients' history has been reviewed, patient examined, no change in status, stable for surgery.  I have reviewed the patients' chart and labs.  Questions were answered to the patient's satisfaction.     Athanasius Kesling J

## 2011-07-27 NOTE — Anesthesia Preprocedure Evaluation (Signed)
Anesthesia Evaluation  Patient identified by MRN, date of birth, ID band Patient awake    Reviewed: Allergy & Precautions, H&P , NPO status , Patient's Chart, lab work & pertinent test results, reviewed documented beta blocker date and time   Airway Mallampati: II TM Distance: >3 FB Neck ROM: Full    Dental No notable dental hx. (+) Teeth Intact and Dental Advisory Given   Pulmonary neg pulmonary ROS,  clear to auscultation  Pulmonary exam normal       Cardiovascular hypertension, Pt. on medications and Pt. on home beta blockers Regular Normal    Neuro/Psych  Neuromuscular disease Negative Psych ROS   GI/Hepatic Neg liver ROS, GERD-  Medicated,  Endo/Other  Negative Endocrine ROS  Renal/GU Bladder tumor   Bladder tumor Genitourinary negative   Musculoskeletal negative musculoskeletal ROS (+)   Abdominal   Peds negative pediatric ROS (+)  Hematology negative hematology ROS (+)   Anesthesia Other Findings   Reproductive/Obstetrics negative OB ROS                           Anesthesia Physical Anesthesia Plan  ASA: II  Anesthesia Plan: General   Post-op Pain Management:    Induction: Intravenous  Airway Management Planned: Oral ETT  Additional Equipment:   Intra-op Plan:   Post-operative Plan: Extubation in OR  Informed Consent: I have reviewed the patients History and Physical, chart, labs and discussed the procedure including the risks, benefits and alternatives for the proposed anesthesia with the patient or authorized representative who has indicated his/her understanding and acceptance.   Dental advisory given  Plan Discussed with: CRNA  Anesthesia Plan Comments:         Anesthesia Quick Evaluation

## 2011-07-27 NOTE — Brief Op Note (Signed)
07/27/2011  11:15 AM  PATIENT:  [name]  77 y.o. male  PRE-OPERATIVE DIAGNOSIS:  Large Bladder Tumor  POST-OPERATIVE DIAGNOSIS:  Large Bladder Tumor  PROCEDURE:  Procedure(s): TRANSURETHRAL RESECTION OF BLADDER TUMOR (TURBT)  SURGEON:  Surgeon(s): Anner Crete  PHYSICIAN ASSISTANT:   ASSISTANTS: none   ANESTHESIA:   general  EBL:     BLOOD ADMINISTERED:none  DRAINS: Urinary Catheter (Foley)   LOCAL MEDICATIONS USED:  NONE  SPECIMEN:  Biopsy / Limited Resection  DISPOSITION OF SPECIMEN:  PATHOLOGY  COUNTS:  YES  TOURNIQUET:  * No tourniquets in log *  DICTATION: .Other Dictation: Dictation Number [id number]  PLAN OF CARE: Admit for overnight observation  PATIENT DISPOSITION:  PACU - hemodynamically stable.   Delay start of Pharmacological VTE agent (>24hrs) due to surgical blood loss or risk of bleeding:  {YES/NO/NOT APPLICABLE:20182

## 2011-07-27 NOTE — Transfer of Care (Signed)
Immediate Anesthesia Transfer of Care Note  Patient: [name]  Procedure(s) Performed:  TRANSURETHRAL RESECTION OF BLADDER TUMOR (TURBT) - Cystoscopy/Transurethral Resection of Bladder Tumor  Patient Location: PACU  Anesthesia Type: General  Level of Consciousness: sedated  Airway & Oxygen Therapy: Patient Spontanous Breathing and Patient connected to face mask  Post-op Assessment: Report given to PACU RN and Post -op Vital signs reviewed and stable  Post vital signs: Reviewed and stable  Complications: No apparent anesthesia complications

## 2011-07-28 ENCOUNTER — Emergency Department (HOSPITAL_COMMUNITY)
Admission: EM | Admit: 2011-07-28 | Discharge: 2011-07-29 | Disposition: A | Discharge status: Home / Self Care | Payer: Medicare Other | Source: Home / Self Care | Attending: Emergency Medicine | Admitting: Emergency Medicine

## 2011-07-28 ENCOUNTER — Encounter (HOSPITAL_COMMUNITY): Payer: Self-pay | Admitting: *Deleted

## 2011-07-28 DIAGNOSIS — R509 Fever, unspecified: Secondary | ICD-10-CM | POA: Insufficient documentation

## 2011-07-28 DIAGNOSIS — R000 Tachycardia, unspecified: Secondary | ICD-10-CM | POA: Insufficient documentation

## 2011-07-28 DIAGNOSIS — Z9889 Other specified postprocedural states: Secondary | ICD-10-CM | POA: Insufficient documentation

## 2011-07-28 DIAGNOSIS — R319 Hematuria, unspecified: Secondary | ICD-10-CM | POA: Insufficient documentation

## 2011-07-28 DIAGNOSIS — R10819 Abdominal tenderness, unspecified site: Secondary | ICD-10-CM | POA: Insufficient documentation

## 2011-07-28 DIAGNOSIS — R300 Dysuria: Secondary | ICD-10-CM | POA: Insufficient documentation

## 2011-07-28 DIAGNOSIS — R339 Retention of urine, unspecified: Secondary | ICD-10-CM

## 2011-07-28 DIAGNOSIS — I10 Essential (primary) hypertension: Secondary | ICD-10-CM | POA: Insufficient documentation

## 2011-07-28 LAB — DIFFERENTIAL
Basophils Absolute: 0 10*3/uL (ref 0.0–0.1)
Basophils Relative: 0 % (ref 0–1)
Eosinophils Absolute: 0 10*3/uL (ref 0.0–0.7)
Eosinophils Relative: 0 % (ref 0–5)
Lymphocytes Relative: 15 % (ref 12–46)

## 2011-07-28 LAB — CBC
MCH: 31.8 pg (ref 26.0–34.0)
MCHC: 34.2 g/dL (ref 30.0–36.0)
MCV: 93 fL (ref 78.0–100.0)
Platelets: 164 10*3/uL (ref 150–400)
RDW: 13.8 % (ref 11.5–15.5)
WBC: 11.7 10*3/uL — ABNORMAL HIGH (ref 4.0–10.5)

## 2011-07-28 LAB — BASIC METABOLIC PANEL
Calcium: 9.2 mg/dL (ref 8.4–10.5)
Creatinine, Ser: 1.3 mg/dL (ref 0.50–1.35)
GFR calc Af Amer: 57 mL/min — ABNORMAL LOW (ref >90)
GFR calc non Af Amer: 49 mL/min — ABNORMAL LOW (ref >90)
Sodium: 134 mEq/L — ABNORMAL LOW (ref 135–145)

## 2011-07-28 LAB — URINE MICROSCOPIC-ADD ON

## 2011-07-28 LAB — URINALYSIS, ROUTINE W REFLEX MICROSCOPIC
Ketones, ur: 15 mg/dL — AB
Protein, ur: >300 mg/dL — AB
Urobilinogen, UA: 1 mg/dL (ref 0.0–1.0)

## 2011-07-28 MED ORDER — ONDANSETRON HCL 4 MG/2ML IJ SOLN
4.0000 mg | Freq: Once | INTRAMUSCULAR | Status: AC
  Administered 2011-07-28: 4 mg via INTRAVENOUS
  Filled 2011-07-28: qty 2

## 2011-07-28 MED ORDER — HYDROMORPHONE HCL PF 1 MG/ML IJ SOLN
1.0000 mg | Freq: Once | INTRAMUSCULAR | Status: AC
  Administered 2011-07-28: 1 mg via INTRAVENOUS
  Filled 2011-07-28: qty 1

## 2011-07-28 NOTE — ED Notes (Signed)
Pt in s/o bladder surgery yesterday, pt was d/c'd home with catheter, per family urine has been red all day, pt c/o severe pain and states drainage has decreased, family states last night his catheter required frequent irrigation

## 2011-07-28 NOTE — Discharge Summary (Signed)
Physician Discharge Summary  Patient ID: [name] MRN: [id number] DOB/AGE: 1925/12/11 77 y.o.  Admit date: 07/27/2011 Discharge date: 07/28/2011  Admission Diagnoses:  Bladder cancer  Discharge Diagnoses: Multifocal bladder cancer with probable CIS involving the entire bladder wall and prostatic urethra. Principal Problem:  *Bladder cancer   Discharged Condition: good  Hospital Course: [name] had a TURBT on 12/6 and was found to have extensive papillary TCCa with involvement of the bladder and prostatic urethra diffusely.  He appears to have diffuse CIS as well.   He underwent a partial resection and the procedure was complicated by a urethral false passage.   His hospital course has been reasonably uneventful, but he did require irrigation for small clots through the night.   The foley is now draining well with minimally pink urine and he is without major complaints but does have some foley irritation.  Consults: none  Significant Diagnostic Studies: None  Treatments: surgery: TURBT  Discharge Exam: Blood pressure 117/61, pulse 82, temperature 98.6 F (37 C), temperature source Oral, resp. rate 18, height 5\' 9"  (1.753 m), weight 86.909 kg (191 lb 9.6 oz), SpO2 93.00%. General appearance: alert, cooperative and no distress Resp: clear to auscultation bilaterally Cardio: regular rate and rhythm, S1, S2 normal, no murmur, click, rub or gallop GI: He has some suprapubic tenderness. Male genitalia: normal, Foley draining pink tinged urine without clots.  Disposition: Home or Self Care  He will be sent home with his foley to allow the false passage to heal.  He will remove the foley at home on Monday and f/u with me as planned.  Discharge Orders    Future Orders Please Complete By Expires   Urinary leg bag      Diet - low sodium heart healthy      Increase activity slowly      Discharge instructions      Comments:   Call for fever, severe pain or catheter issues. I would  like to leave the foley catheter until Monday morning.   You can remove the catheter at home by cutting the side arm off of the catheter.   A few teaspoons of water will drain out and the catheter should slide out easily.   If you have problems removing the cathter, don't force it and call the office.   Discontinue IV        Current Discharge Medication List    START taking these medications   Details  ciprofloxacin (CIPRO) 250 MG tablet Take 1 tablet (250 mg total) by mouth 2 (two) times daily. Qty: 6 tablet, Refills: 0    docusate sodium (COLACE) 100 MG capsule Take 1 capsule (100 mg total) by mouth 2 (two) times daily. Qty: 60 capsule, Refills: 2    hyoscyamine (LEVSIN/SL) 0.125 MG SL tablet Place 1 tablet (0.125 mg total) under the tongue every 4 (four) hours as needed for cramping. Qty: 30 tablet, Refills: 0    phenazopyridine (PYRIDIUM) 200 MG tablet Take 1 tablet (200 mg total) by mouth 3 (three) times daily as needed for pain. Qty: 30 tablet, Refills: 0      CONTINUE these medications which have NOT CHANGED   Details  acetaminophen (TYLENOL) 325 MG tablet Take 650 mg by mouth every 6 (six) hours as needed. For pain    metoprolol tartrate (LOPRESSOR) 25 MG tablet Take 12.5 mg by mouth 2 (two) times daily.     omeprazole (PRILOSEC) 20 MG capsule Take 20 mg by mouth daily.  valsartan (DIOVAN) 320 MG tablet Take 160 mg by mouth daily.         Follow-up Information    Follow up with [name] on 08/07/2011. (11:15)    Contact information:   [address] [facility] Floor [facility] [place] [address] [phone]          Signed: Anner Crete 07/28/2011, 7:30 AM

## 2011-07-28 NOTE — ED Provider Notes (Signed)
History     CSN: [id number] Arrival date & time: 07/28/2011  8:10 PM   First MD Initiated Contact with Patient 07/28/11 2034      Chief Complaint  Patient presents with  . Urinary Retention    (Consider location/radiation/quality/duration/timing/severity/associated sxs/prior treatment) HPI Comments: Patient and family here after patient had bladder surgery yesterday with biopsy showing malignancy, reports while in hospital catheter had to be irrigated frequently because of blood clots, reports discharge home from the hospital today, wife states urine continues to be red, and now the patient has complained of worsening bladder pain throughout the day.  Wife reports that there is still urine coming from the catheter but has not flushed it.  Patient is a 77 y.o. male presenting with dysuria. The history is provided by the patient and the spouse. No language interpreter was used.  Dysuria  This is a new problem. The current episode started 6 to 12 hours ago. The problem occurs every urination. The problem has been gradually worsening. The quality of the pain is described as stabbing and shooting. The pain is at a severity of 8/10. The pain is severe. There has been no fever. He is not sexually active. There is no history of pyelonephritis. Associated symptoms include nausea and hematuria. Pertinent negatives include no chills, no sweats, no vomiting, no discharge, no frequency, no hesitancy, no possible pregnancy, no urgency and no flank pain. He has tried antibiotics for the symptoms. His past medical history is significant for urological procedure. Past medical history comments: bladder surgery.    Past Medical History  Diagnosis Date  . DIVERTICULOSIS, COLON 10/07/2006    pt. has freq boutw with constipation alternates with diarrhea  . HYPERLIPIDEMIA 10/07/2006  . GERD 10/06/2008  . HYPERTROPHY PROSTATE W/UR OBST & OTH LUTS 06/03/2009  . TORTICOLLIS 10/27/2009  . Memory loss 08/12/2007  .  Palpitations 03/22/2010  . ACTION TREMOR 08/12/2007  . Meralgia paraesthetica   . Syncope   . Urosepsis 2007    with syncope  . Prostatitis 2007  . HYPERTENSION 10/07/2006  . Skin cancer   . Microscopic hematuria 07-24-11    no visual blood, last UTI was 10'12,tx. with antibiotic  . Vertigo 07-24-11    x2     Past Surgical History  Procedure Date  . Tonsillectomy   . Colonoscopy      Diverticulosis  . Cholecystectomy 07-24-11    approx. 4 yrs ago(attacks)  . Cataract extraction, bilateral 07-24-11    bilateral lens implants    Family History  Problem Relation Age of Onset  . Heart attack Father 64  . Colon cancer Mother 49  . Hypertension Mother   . Cancer Mother     Colon Cancer  . Breast cancer Maternal Grandmother   . Cancer Maternal Grandmother     Breast Cancer  . Heart attack Maternal Grandfather     late 69s  . Heart failure Sister   . Heart disease Sister     CHF    History  Substance Use Topics  . Smoking status: Former Smoker -- 2 years    Quit date: 08/21/1973  . Smokeless tobacco: Never Used  . Alcohol Use: Yes     occasional use      Review of Systems  Constitutional: Positive for fever. Negative for chills.  HENT: Positive for neck pain. Negative for ear pain.   Gastrointestinal: Positive for nausea. Negative for vomiting.  Genitourinary: Positive for dysuria and hematuria. Negative for hesitancy, urgency,  frequency and flank pain.  All other systems reviewed and are negative.    Allergies  Ramipril; Tamsulosin; and Cephalexin  Home Medications   Current Outpatient Rx  Name Route Sig Dispense Refill  . ACETAMINOPHEN 325 MG PO TABS Oral Take 650 mg by mouth every 6 (six) hours as needed. For pain    . CIPROFLOXACIN HCL 250 MG PO TABS Oral Take 1 tablet (250 mg total) by mouth 2 (two) times daily. 6 tablet 0  . DOCUSATE SODIUM 100 MG PO CAPS Oral Take 1 capsule (100 mg total) by mouth 2 (two) times daily. 60 capsule 2  . HYOSCYAMINE  SULFATE 0.125 MG SL SUBL Sublingual Place 1 tablet (0.125 mg total) under the tongue every 4 (four) hours as needed for cramping. 30 tablet 0  . METOPROLOL TARTRATE 25 MG PO TABS Oral Take 12.5 mg by mouth 2 (two) times daily.     Marland Kitchen OMEPRAZOLE 20 MG PO CPDR Oral Take 20 mg by mouth daily.     Marland Kitchen PHENAZOPYRIDINE HCL 200 MG PO TABS Oral Take 1 tablet (200 mg total) by mouth 3 (three) times daily as needed for pain. 30 tablet 0  . VALSARTAN 320 MG PO TABS Oral Take 160 mg by mouth daily.        BP 110/59  Pulse 121  Temp(Src) 99.1 F (37.3 C) (Oral)  SpO2 92%  Physical Exam  Nursing note and vitals reviewed. Constitutional: He appears well-developed and well-nourished.  HENT:  Head: Normocephalic and atraumatic.  Right Ear: External ear normal.  Left Ear: External ear normal.  Mouth/Throat: Oropharynx is clear and moist. No oropharyngeal exudate.  Eyes: Conjunctivae are normal. Pupils are equal, round, and reactive to light. No scleral icterus.  Neck: Normal range of motion. Neck supple.  Cardiovascular: Regular rhythm and normal heart sounds.  Tachycardia present.   Pulmonary/Chest: Effort normal and breath sounds normal. No respiratory distress. He exhibits no tenderness.  Abdominal: Soft. Bowel sounds are normal. There is tenderness in the suprapubic area. There is no rigidity and no guarding.  Genitourinary:    Uncircumcised.  Musculoskeletal: Normal range of motion.  Neurological: He is alert. No cranial nerve deficit.  Skin: Skin is warm and dry. No rash noted.  Psychiatric: He has a normal mood and affect. His behavior is normal. Judgment and thought content normal.    ED Course  Procedures (including critical care time)   Labs Reviewed  CBC  DIFFERENTIAL  BASIC METABOLIC PANEL  URINALYSIS, ROUTINE W REFLEX MICROSCOPIC  URINE CULTURE   Results for orders placed during the hospital encounter of 07/28/11  CBC      Component Value Range   WBC 11.7 (*) 4.0 - 10.5  (K/uL)   RBC 4.69  4.22 - 5.81 (MIL/uL)   Hemoglobin 14.9  13.0 - 17.0 (g/dL)   HCT 62.1  30.8 - 65.7 (%)   MCV 93.0  78.0 - 100.0 (fL)   MCH 31.8  26.0 - 34.0 (pg)   MCHC 34.2  30.0 - 36.0 (g/dL)   RDW 84.6  96.2 - 95.2 (%)   Platelets 164  150 - 400 (K/uL)  DIFFERENTIAL      Component Value Range   Neutrophils Relative 77  43 - 77 (%)   Neutro Abs 9.0 (*) 1.7 - 7.7 (K/uL)   Lymphocytes Relative 15  12 - 46 (%)   Lymphs Abs 1.7  0.7 - 4.0 (K/uL)   Monocytes Relative 8  3 - 12 (%)  Monocytes Absolute 1.0  0.1 - 1.0 (K/uL)   Eosinophils Relative 0  0 - 5 (%)   Eosinophils Absolute 0.0  0.0 - 0.7 (K/uL)   Basophils Relative 0  0 - 1 (%)   Basophils Absolute 0.0  0.0 - 0.1 (K/uL)  BASIC METABOLIC PANEL      Component Value Range   Sodium 134 (*) 135 - 145 (mEq/L)   Potassium 3.9  3.5 - 5.1 (mEq/L)   Chloride 102  96 - 112 (mEq/L)   CO2 24  19 - 32 (mEq/L)   Glucose, Bld 161 (*) 70 - 99 (mg/dL)   BUN 12  6 - 23 (mg/dL)   Creatinine, Ser 1.61  0.50 - 1.35 (mg/dL)   Calcium 9.2  8.4 - 09.6 (mg/dL)   GFR calc non Af Amer 49 (*) >90 (mL/min)   GFR calc Af Amer 57 (*) >90 (mL/min)  URINALYSIS, ROUTINE W REFLEX MICROSCOPIC      Component Value Range   Color, Urine RED (*) YELLOW    APPearance TURBID (*) CLEAR    Specific Gravity, Urine 1.026  1.005 - 1.030    pH 5.5  5.0 - 8.0    Glucose, UA 100 (*) NEGATIVE (mg/dL)   Hgb urine dipstick LARGE (*) NEGATIVE    Bilirubin Urine MODERATE (*) NEGATIVE    Ketones, ur 15 (*) NEGATIVE (mg/dL)   Protein, ur >045 (*) NEGATIVE (mg/dL)   Urobilinogen, UA 1.0  0.0 - 1.0 (mg/dL)   Nitrite POSITIVE (*) NEGATIVE    Leukocytes, UA LARGE (*) NEGATIVE   URINE MICROSCOPIC-ADD ON      Component Value Range   RBC / HPF TOO NUMEROUS TO COUNT  <3 (RBC/hpf)   Urine-Other FIELD OBSCURED BY RBC'S     No results found.     Urinary retention    MDM  Three way foley catheter inserted per nursing staff, after bladder irrigation, several clots  removed and the effluent is now clearing - the patient reports pain is now gone.  We will plan to place the leg bag back and sent the patient home again.  We will leave in the three way and stopper off the irrigation port.  He is scheduled to have catheter removed on Monday.  The family is aware that they may return if it should get clogged again.       Izola Price [place], [place] 07/29/11 0225

## 2011-07-29 ENCOUNTER — Encounter (HOSPITAL_COMMUNITY): Payer: Self-pay | Admitting: *Deleted

## 2011-07-29 ENCOUNTER — Inpatient Hospital Stay (HOSPITAL_COMMUNITY)
Admission: EM | Admit: 2011-07-29 | Discharge: 2011-08-07 | DRG: 687 | Disposition: A | Discharge status: Home / Self Care | Payer: Medicare Other | Attending: Family Medicine | Admitting: Family Medicine

## 2011-07-29 DIAGNOSIS — R319 Hematuria, unspecified: Secondary | ICD-10-CM | POA: Diagnosis present

## 2011-07-29 DIAGNOSIS — D090 Carcinoma in situ of bladder: Secondary | ICD-10-CM

## 2011-07-29 DIAGNOSIS — F3289 Other specified depressive episodes: Secondary | ICD-10-CM | POA: Diagnosis present

## 2011-07-29 DIAGNOSIS — N138 Other obstructive and reflux uropathy: Secondary | ICD-10-CM

## 2011-07-29 DIAGNOSIS — E871 Hypo-osmolality and hyponatremia: Secondary | ICD-10-CM

## 2011-07-29 DIAGNOSIS — N401 Enlarged prostate with lower urinary tract symptoms: Secondary | ICD-10-CM

## 2011-07-29 DIAGNOSIS — I951 Orthostatic hypotension: Secondary | ICD-10-CM

## 2011-07-29 DIAGNOSIS — K219 Gastro-esophageal reflux disease without esophagitis: Secondary | ICD-10-CM

## 2011-07-29 DIAGNOSIS — C679 Malignant neoplasm of bladder, unspecified: Principal | ICD-10-CM

## 2011-07-29 DIAGNOSIS — E785 Hyperlipidemia, unspecified: Secondary | ICD-10-CM

## 2011-07-29 DIAGNOSIS — Z79899 Other long term (current) drug therapy: Secondary | ICD-10-CM

## 2011-07-29 DIAGNOSIS — F329 Major depressive disorder, single episode, unspecified: Secondary | ICD-10-CM | POA: Diagnosis present

## 2011-07-29 DIAGNOSIS — N3289 Other specified disorders of bladder: Secondary | ICD-10-CM | POA: Diagnosis present

## 2011-07-29 DIAGNOSIS — I10 Essential (primary) hypertension: Secondary | ICD-10-CM

## 2011-07-29 DIAGNOSIS — E876 Hypokalemia: Secondary | ICD-10-CM | POA: Diagnosis not present

## 2011-07-29 DIAGNOSIS — G252 Other specified forms of tremor: Secondary | ICD-10-CM

## 2011-07-29 DIAGNOSIS — R339 Retention of urine, unspecified: Secondary | ICD-10-CM

## 2011-07-29 DIAGNOSIS — Z01812 Encounter for preprocedural laboratory examination: Secondary | ICD-10-CM

## 2011-07-29 DIAGNOSIS — R159 Full incontinence of feces: Secondary | ICD-10-CM | POA: Diagnosis not present

## 2011-07-29 DIAGNOSIS — N3941 Urge incontinence: Secondary | ICD-10-CM | POA: Diagnosis not present

## 2011-07-29 DIAGNOSIS — R7309 Other abnormal glucose: Secondary | ICD-10-CM

## 2011-07-29 DIAGNOSIS — R1312 Dysphagia, oropharyngeal phase: Secondary | ICD-10-CM

## 2011-07-29 DIAGNOSIS — R5381 Other malaise: Secondary | ICD-10-CM | POA: Diagnosis present

## 2011-07-29 DIAGNOSIS — K5900 Constipation, unspecified: Secondary | ICD-10-CM | POA: Diagnosis present

## 2011-07-29 DIAGNOSIS — D126 Benign neoplasm of colon, unspecified: Secondary | ICD-10-CM

## 2011-07-29 DIAGNOSIS — R350 Frequency of micturition: Secondary | ICD-10-CM

## 2011-07-29 DIAGNOSIS — R197 Diarrhea, unspecified: Secondary | ICD-10-CM | POA: Diagnosis not present

## 2011-07-29 DIAGNOSIS — G250 Essential tremor: Secondary | ICD-10-CM

## 2011-07-29 DIAGNOSIS — M436 Torticollis: Secondary | ICD-10-CM

## 2011-07-29 DIAGNOSIS — I6529 Occlusion and stenosis of unspecified carotid artery: Secondary | ICD-10-CM

## 2011-07-29 DIAGNOSIS — H8110 Benign paroxysmal vertigo, unspecified ear: Secondary | ICD-10-CM

## 2011-07-29 DIAGNOSIS — R627 Adult failure to thrive: Secondary | ICD-10-CM | POA: Diagnosis present

## 2011-07-29 DIAGNOSIS — K5730 Diverticulosis of large intestine without perforation or abscess without bleeding: Secondary | ICD-10-CM

## 2011-07-29 DIAGNOSIS — R002 Palpitations: Secondary | ICD-10-CM

## 2011-07-29 DIAGNOSIS — K560 Paralytic ileus: Secondary | ICD-10-CM | POA: Diagnosis not present

## 2011-07-29 DIAGNOSIS — R413 Other amnesia: Secondary | ICD-10-CM

## 2011-07-29 DIAGNOSIS — R609 Edema, unspecified: Secondary | ICD-10-CM

## 2011-07-29 DIAGNOSIS — Z87440 Personal history of urinary (tract) infections: Secondary | ICD-10-CM

## 2011-07-29 MED ORDER — INFLUENZA VIRUS VACC SPLIT PF IM SUSP
0.5000 mL | INTRAMUSCULAR | Status: AC
  Administered 2011-07-30: 0.5 mL via INTRAMUSCULAR
  Filled 2011-07-29: qty 0.5

## 2011-07-29 MED ORDER — CIPROFLOXACIN HCL 250 MG PO TABS
250.0000 mg | ORAL_TABLET | Freq: Two times a day (BID) | ORAL | Status: DC
  Administered 2011-07-29 – 2011-07-30 (×3): 250 mg via ORAL
  Filled 2011-07-29 (×5): qty 1

## 2011-07-29 MED ORDER — ONDANSETRON HCL 4 MG/2ML IJ SOLN
4.0000 mg | INTRAMUSCULAR | Status: DC | PRN
  Administered 2011-07-30 – 2011-08-01 (×3): 4 mg via INTRAVENOUS
  Filled 2011-07-29 (×3): qty 2

## 2011-07-29 MED ORDER — HYDROCODONE-ACETAMINOPHEN 5-325 MG PO TABS
1.0000 | ORAL_TABLET | ORAL | Status: DC | PRN
  Administered 2011-07-30 – 2011-07-31 (×4): 1 via ORAL
  Administered 2011-08-03: 2 via ORAL
  Administered 2011-08-03 – 2011-08-04 (×2): 1 via ORAL
  Filled 2011-07-29: qty 2
  Filled 2011-07-29 (×4): qty 1
  Filled 2011-07-29 (×3): qty 2

## 2011-07-29 MED ORDER — PANTOPRAZOLE SODIUM 40 MG PO TBEC
40.0000 mg | DELAYED_RELEASE_TABLET | Freq: Every day | ORAL | Status: DC
  Administered 2011-07-29 – 2011-08-05 (×8): 40 mg via ORAL
  Filled 2011-07-29 (×7): qty 1

## 2011-07-29 MED ORDER — KCL IN DEXTROSE-NACL 10-5-0.45 MEQ/L-%-% IV SOLN
INTRAVENOUS | Status: DC
  Administered 2011-07-29: 1000 mL via INTRAVENOUS
  Administered 2011-07-30: 13:00:00 via INTRAVENOUS
  Administered 2011-07-31: 1000 mL via INTRAVENOUS
  Filled 2011-07-29 (×5): qty 1000

## 2011-07-29 MED ORDER — PHENAZOPYRIDINE HCL 200 MG PO TABS
200.0000 mg | ORAL_TABLET | Freq: Three times a day (TID) | ORAL | Status: DC | PRN
  Administered 2011-08-01 – 2011-08-03 (×2): 200 mg via ORAL
  Filled 2011-07-29 (×2): qty 1

## 2011-07-29 MED ORDER — LIDOCAINE HCL 2 % EX GEL
CUTANEOUS | Status: AC
  Administered 2011-07-29: 01:00:00
  Filled 2011-07-29: qty 10

## 2011-07-29 MED ORDER — DOCUSATE SODIUM 100 MG PO CAPS
100.0000 mg | ORAL_CAPSULE | Freq: Two times a day (BID) | ORAL | Status: DC
  Administered 2011-07-29 – 2011-07-30 (×3): 100 mg via ORAL
  Filled 2011-07-29 (×5): qty 1

## 2011-07-29 MED ORDER — HYOSCYAMINE SULFATE 0.125 MG SL SUBL
0.1250 mg | SUBLINGUAL_TABLET | SUBLINGUAL | Status: DC | PRN
  Administered 2011-07-30 – 2011-08-01 (×3): 0.125 mg via SUBLINGUAL
  Filled 2011-07-29 (×3): qty 1

## 2011-07-29 MED ORDER — SENNA 8.6 MG PO TABS
1.0000 | ORAL_TABLET | Freq: Two times a day (BID) | ORAL | Status: DC
  Administered 2011-07-29 – 2011-07-30 (×3): 8.6 mg via ORAL
  Filled 2011-07-29 (×2): qty 1

## 2011-07-29 MED ORDER — SENNOSIDES-DOCUSATE SODIUM 8.6-50 MG PO TABS
2.0000 | ORAL_TABLET | Freq: Every day | ORAL | Status: DC
  Administered 2011-07-29: 2 via ORAL
  Filled 2011-07-29 (×3): qty 2

## 2011-07-29 MED ORDER — METOPROLOL TARTRATE 12.5 MG HALF TABLET
12.5000 mg | ORAL_TABLET | Freq: Two times a day (BID) | ORAL | Status: DC
  Administered 2011-07-29 – 2011-08-03 (×10): 12.5 mg via ORAL
  Filled 2011-07-29 (×13): qty 1

## 2011-07-29 MED ORDER — BISACODYL 10 MG RE SUPP
10.0000 mg | Freq: Once | RECTAL | Status: AC
  Administered 2011-07-29: 10 mg via RECTAL
  Filled 2011-07-29: qty 1

## 2011-07-29 NOTE — ED Notes (Signed)
Pt with leaking around cath, bladder surgery on Thursday, discharged on Friday, seen here earlier for same, had blood clot in cath this morning

## 2011-07-29 NOTE — ED Provider Notes (Signed)
History     CSN: [id number] Arrival date & time: 07/29/2011  2:22 PM   Chief Complaint  Patient presents with  . Urinary Retention    HPI Pt was seen at 1525.  Per pt and his wife, c/o gradual onset and persistence of constant "foley catheter not draining" for the past several hours.  Pt and wife state they have seen "blood clots" in the tubing with hematuria.  Pt was discharged from ED overnight last night for same complaint.  Had foley passed s/p TURP 2d ago, changed last night to 3-way.  Due to f/u with Uro MD on Monday.  Has been assoc with lower abd "pain" and "swelling."  Denies fevers, no back pain, no CP/SOB, no N/V/D, no rash, no testicular pain/swelling.    Past Medical History  Diagnosis Date  . DIVERTICULOSIS, COLON 10/07/2006    pt. has freq boutw with constipation alternates with diarrhea  . HYPERLIPIDEMIA 10/07/2006  . GERD 10/06/2008  . HYPERTROPHY PROSTATE W/UR OBST & OTH LUTS 06/03/2009  . TORTICOLLIS 10/27/2009  . Memory loss 08/12/2007  . Palpitations 03/22/2010  . ACTION TREMOR 08/12/2007  . Meralgia paraesthetica   . Syncope   . Urosepsis 2007    with syncope  . Prostatitis 2007  . HYPERTENSION 10/07/2006  . Skin cancer   . Microscopic hematuria 07-24-11    no visual blood, last UTI was 10'12,tx. with antibiotic  . Vertigo 07-24-11    x2     Past Surgical History  Procedure Date  . Tonsillectomy   . Colonoscopy      Diverticulosis  . Cholecystectomy 07-24-11    approx. 4 yrs ago(attacks)  . Cataract extraction, bilateral 07-24-11    bilateral lens implants    Family History  Problem Relation Age of Onset  . Heart attack Father 16  . Colon cancer Mother 43  . Hypertension Mother   . Cancer Mother     Colon Cancer  . Breast cancer Maternal Grandmother   . Cancer Maternal Grandmother     Breast Cancer  . Heart attack Maternal Grandfather     late 60s  . Heart failure Sister   . Heart disease Sister     CHF    History  Substance Use Topics  .  Smoking status: Former Smoker -- 2 years    Quit date: 08/21/1973  . Smokeless tobacco: Never Used  . Alcohol Use: Yes     occasional use    Review of Systems ROS: Statement: All systems negative except as marked or noted in the HPI; Constitutional: Negative for fever and chills. ; ; Eyes: Negative for eye pain, redness and discharge. ; ; ENMT: Negative for ear pain, hoarseness, nasal congestion, sinus pressure and sore throat. ; ; Cardiovascular: Negative for chest pain, palpitations, diaphoresis, dyspnea and peripheral edema. ; ; Respiratory: Negative for cough, wheezing and stridor. ; ; Gastrointestinal: +abd pain.  Negative for nausea, vomiting, diarrhea, blood in stool, hematemesis, jaundice and rectal bleeding. . ; ; Genitourinary: Negative for dysuria, flank pain and +hematuria. ; Genital:  No penile drainage or rash, no testicular pain or swelling, no scrotal rash or swelling.; Musculoskeletal: Negative for back pain and neck pain. Negative for swelling and trauma.; ; Skin: Negative for pruritus, rash, abrasions, blisters, bruising and skin lesion.; ; Neuro: Negative for headache, lightheadedness and neck stiffness. Negative for weakness, altered level of consciousness , altered mental status, extremity weakness, paresthesias, involuntary movement, seizure and syncope.     Allergies  Ramipril; Tamsulosin; and Cephalexin  Home Medications   Current Outpatient Rx  Name Route Sig Dispense Refill  . ACETAMINOPHEN 325 MG PO TABS Oral Take 650 mg by mouth every 6 (six) hours as needed. For pain    . CIPROFLOXACIN HCL 250 MG PO TABS Oral Take 1 tablet (250 mg total) by mouth 2 (two) times daily. 6 tablet 0  . DOCUSATE SODIUM 100 MG PO CAPS Oral Take 1 capsule (100 mg total) by mouth 2 (two) times daily. 60 capsule 2  . HYOSCYAMINE SULFATE 0.125 MG SL SUBL Sublingual Place 1 tablet (0.125 mg total) under the tongue every 4 (four) hours as needed for cramping. 30 tablet 0  . METOPROLOL TARTRATE  25 MG PO TABS Oral Take 12.5 mg by mouth 2 (two) times daily.     Marland Kitchen OMEPRAZOLE 20 MG PO CPDR Oral Take 20 mg by mouth daily.     Marland Kitchen PHENAZOPYRIDINE HCL 200 MG PO TABS Oral Take 1 tablet (200 mg total) by mouth 3 (three) times daily as needed for pain. 30 tablet 0  . VALSARTAN 320 MG PO TABS Oral Take 160 mg by mouth daily.        BP 98/60  Pulse 93  Temp(Src) 97.7 F (36.5 C) (Oral)  Resp 18  SpO2 93%  Physical Exam 1530: Physical examination:  Nursing notes reviewed; Vital signs and O2 SAT reviewed;  Constitutional: Well developed, Well nourished, Well hydrated, In no acute distress; Head:  Normocephalic, atraumatic; Eyes: EOMI, PERRL, No scleral icterus; ENMT: Mouth and pharynx normal, Mucous membranes moist; Neck: Supple, Full range of motion, No lymphadenopathy; Cardiovascular: Regular rate and rhythm, No murmur, rub, or gallop; Respiratory: Breath sounds clear & equal bilaterally, No rales, rhonchi, wheezes, or rub, Normal respiratory effort/excursion; Chest: Nontender, Movement normal; Abdomen:  +suprapubic area tender, softly distended and tympanitic; Normal bowel sounds, +foley tubing and bag with small amount hematuria; Extremities: Pulses normal, No tenderness, no edema, No calf edema or asymmetry.; Neuro: AA&Ox3, Major CN grossly intact.  No gross focal motor or sensory deficits in extremities.; Skin: Color normal, Warm, Dry.    ED Course  Procedures   MDM  MDM Reviewed: nursing note, previous chart and vitals Reviewed previous: labs Interpretation: labs   Results for orders placed during the hospital encounter of 07/28/11  CBC      Component Value Range   WBC 11.7 (*) 4.0 - 10.5 (K/uL)   RBC 4.69  4.22 - 5.81 (MIL/uL)   Hemoglobin 14.9  13.0 - 17.0 (g/dL)   HCT 45.4  09.8 - 11.9 (%)   MCV 93.0  78.0 - 100.0 (fL)   MCH 31.8  26.0 - 34.0 (pg)   MCHC 34.2  30.0 - 36.0 (g/dL)   RDW 14.7  82.9 - 56.2 (%)   Platelets 164  150 - 400 (K/uL)  DIFFERENTIAL      Component  Value Range   Neutrophils Relative 77  43 - 77 (%)   Neutro Abs 9.0 (*) 1.7 - 7.7 (K/uL)   Lymphocytes Relative 15  12 - 46 (%)   Lymphs Abs 1.7  0.7 - 4.0 (K/uL)   Monocytes Relative 8  3 - 12 (%)   Monocytes Absolute 1.0  0.1 - 1.0 (K/uL)   Eosinophils Relative 0  0 - 5 (%)   Eosinophils Absolute 0.0  0.0 - 0.7 (K/uL)   Basophils Relative 0  0 - 1 (%)   Basophils Absolute 0.0  0.0 - 0.1 (K/uL)  BASIC METABOLIC PANEL      Component Value Range   Sodium 134 (*) 135 - 145 (mEq/L)   Potassium 3.9  3.5 - 5.1 (mEq/L)   Chloride 102  96 - 112 (mEq/L)   CO2 24  19 - 32 (mEq/L)   Glucose, Bld 161 (*) 70 - 99 (mg/dL)   BUN 12  6 - 23 (mg/dL)   Creatinine, Ser 1.47  0.50 - 1.35 (mg/dL)   Calcium 9.2  8.4 - 82.9 (mg/dL)   GFR calc non Af Amer 49 (*) >90 (mL/min)   GFR calc Af Amer 57 (*) >90 (mL/min)  URINALYSIS, ROUTINE W REFLEX MICROSCOPIC      Component Value Range   Color, Urine RED (*) YELLOW    APPearance TURBID (*) CLEAR    Specific Gravity, Urine 1.026  1.005 - 1.030    pH 5.5  5.0 - 8.0    Glucose, UA 100 (*) NEGATIVE (mg/dL)   Hgb urine dipstick LARGE (*) NEGATIVE    Bilirubin Urine MODERATE (*) NEGATIVE    Ketones, ur 15 (*) NEGATIVE (mg/dL)   Protein, ur >562 (*) NEGATIVE (mg/dL)   Urobilinogen, UA 1.0  0.0 - 1.0 (mg/dL)   Nitrite POSITIVE (*) NEGATIVE    Leukocytes, UA LARGE (*) NEGATIVE   URINE MICROSCOPIC-ADD ON      Component Value Range   RBC / HPF TOO NUMEROUS TO COUNT  <3 (RBC/hpf)   Urine-Other FIELD OBSCURED BY RBC'S       1630:  No clots expelled from 3-way cath when irrigated, though foley continues to drain slowly.  Bladder scan with approx per ED RN.  Uro Dr. Berneice Heinrich here in ED, case discussed.  He eval pt at bedside, passed a new foley.  He will admit pt for obs tonight.  Requests medical bed to his svc.          Ulrich Soules Allison [place], DO 07/31/11 1714

## 2011-07-29 NOTE — H&P (Signed)
[name] is an 77 y.o. male.   Chief Complaint: Hematuria, Pelvic Pain, Bladder Cancer HPI:   1 - Bladder Cancer - Pt s/p large volume transurethral resection of bladder tumor 07/27/11 for clinically localized bladder cancer. Path pending. Disease very extensive per op note and partially resected with plan for staged resection depending on final path. This was discovered on w/u of irritative voiding symptoms.  2 - Hematuria, Pelvic Pain - Pt seen in ER today for 2nd ER visit in 24 hours with hematuria with clots, pelvic pain and catheter problems. He had anterior false pass noted on recent procedure. Came to ER last PM with clots in catheter and pain which was resolved by hand irrigation and catheter replacement. He was discharged but returned to ER 10 hours later with similar problems.   Pt denies fevers, chills, nausea, vomiting. He admits to constipation w/o BM is >3 days.  Past Medical History  Diagnosis Date  . DIVERTICULOSIS, COLON 10/07/2006    pt. has freq boutw with constipation alternates with diarrhea  . HYPERLIPIDEMIA 10/07/2006  . GERD 10/06/2008  . HYPERTROPHY PROSTATE W/UR OBST & OTH LUTS 06/03/2009  . TORTICOLLIS 10/27/2009  . Memory loss 08/12/2007  . Palpitations 03/22/2010  . ACTION TREMOR 08/12/2007  . Meralgia paraesthetica   . Syncope   . Urosepsis 2007    with syncope  . Prostatitis 2007  . HYPERTENSION 10/07/2006  . Skin cancer   . Microscopic hematuria 07-24-11    no visual blood, last UTI was 10'12,tx. with antibiotic  . Vertigo 07-24-11    x2     Past Surgical History  Procedure Date  . Tonsillectomy   . Colonoscopy      Diverticulosis  . Cholecystectomy 07-24-11    approx. 4 yrs ago(attacks)  . Cataract extraction, bilateral 07-24-11    bilateral lens implants    Family History  Problem Relation Age of Onset  . Heart attack Father 61  . Colon cancer Mother 39  . Hypertension Mother   . Cancer Mother     Colon Cancer  . Breast cancer Maternal  Grandmother   . Cancer Maternal Grandmother     Breast Cancer  . Heart attack Maternal Grandfather     late 23s  . Heart failure Sister   . Heart disease Sister     CHF   Social History:  reports that he quit smoking about 37 years ago. He has never used smokeless tobacco. He reports that he drinks alcohol. He reports that he does not use illicit drugs.  Allergies:  Allergies  Allergen Reactions  . Ramipril Other (See Comments)    Pt doesn't remember if he is allergic to.   . Tamsulosin Other (See Comments)    Unknown   . Cephalexin Other (See Comments)    Bloody loose stools    Medications Prior to Admission  Medication Dose Route Frequency Provider Last Rate Last Dose  . HYDROmorphone (DILAUDID) injection 1 mg  1 mg Intravenous Once Scarlette Calico C. [place], PA   1 mg at 07/28/11 2317  . influenza  inactive virus vaccine (FLUZONE/FLUARIX) injection 0.5 mL  0.5 mL Intramuscular Tomorrow-1000 Anner Crete      . lidocaine (XYLOCAINE) 2 % jelly           . mitomycin (MUTAMYCIN) chemo injection 40 mg  40 mg Bladder Instillation Once       . ondansetron (ZOFRAN) injection 4 mg  4 mg Intravenous Once Scarlette Calico C. [place], [place]  4 mg at 07/28/11 2318  . DISCONTD: 0.45 % NaCl with KCl 20 mEq / L infusion   Intravenous Continuous Excell Seltzer Wrenn 100 mL/hr at 07/27/11 2314    . DISCONTD: acetaminophen (TYLENOL) tablet 650 mg  650 mg Oral Q4H PRN Excell Seltzer Wrenn   650 mg at 07/27/11 2052  . DISCONTD: ciprofloxacin (CIPRO) tablet 250 mg  250 mg Oral BID Excell Seltzer Wrenn   250 mg at 07/28/11 1610  . DISCONTD: docusate sodium (COLACE) capsule 100 mg  100 mg Oral BID Excell Seltzer Wrenn   100 mg at 07/28/11 9604  . DISCONTD: HYDROcodone-acetaminophen (NORCO) 5-325 MG per tablet 1-2 tablet  1-2 tablet Oral Q4H PRN Anner Crete      . DISCONTD: hyoscyamine (LEVSIN SL) SL tablet 0.125 mg  0.125 mg Oral Q4H PRN Anner Crete      . DISCONTD: lactated ringers infusion   Intravenous Continuous Einar Pheasant, MD 100 mL/hr  at 07/27/11 0916 1,000 mL at 07/27/11 0916  . DISCONTD: metoprolol tartrate (LOPRESSOR) tablet 12.5 mg  12.5 mg Oral BID Excell Seltzer Wrenn   12.5 mg at 07/28/11 5409  . DISCONTD: morphine 2 MG/ML injection 2-4 mg  2-4 mg Intravenous Q2H PRN Excell Seltzer Wrenn   2 mg at 07/28/11 8119  . DISCONTD: olmesartan (BENICAR) tablet 40 mg  40 mg Oral Daily Excell Seltzer Wrenn   40 mg at 07/28/11 1478  . DISCONTD: ondansetron (ZOFRAN) injection 4 mg  4 mg Intravenous Q4H PRN Anner Crete      . DISCONTD: pantoprazole (PROTONIX) EC tablet 40 mg  40 mg Oral Q1200 John J Wrenn   40 mg at 07/28/11 2956  . DISCONTD: zolpidem (AMBIEN) tablet 5 mg  5 mg Oral QHS PRN Anner Crete       Medications Prior to Admission  Medication Sig Dispense Refill  . acetaminophen (TYLENOL) 325 MG tablet Take 650 mg by mouth every 6 (six) hours as needed. For pain      . ciprofloxacin (CIPRO) 250 MG tablet Take 1 tablet (250 mg total) by mouth 2 (two) times daily.  6 tablet  0  . docusate sodium (COLACE) 100 MG capsule Take 1 capsule (100 mg total) by mouth 2 (two) times daily.  60 capsule  2  . hyoscyamine (LEVSIN/SL) 0.125 MG SL tablet Place 1 tablet (0.125 mg total) under the tongue every 4 (four) hours as needed for cramping.  30 tablet  0  . metoprolol tartrate (LOPRESSOR) 25 MG tablet Take 12.5 mg by mouth 2 (two) times daily.       Marland Kitchen omeprazole (PRILOSEC) 20 MG capsule Take 20 mg by mouth daily.       . phenazopyridine (PYRIDIUM) 200 MG tablet Take 1 tablet (200 mg total) by mouth 3 (three) times daily as needed for pain.  30 tablet  0  . valsartan (DIOVAN) 320 MG tablet Take 160 mg by mouth daily.          Results for orders placed during the hospital encounter of 07/28/11 (from the past 48 hour(s))  CBC     Status: Abnormal   Collection Time   07/28/11  9:25 PM      Component Value Range Comment   WBC 11.7 (*) 4.0 - 10.5 (K/uL)    RBC 4.69  4.22 - 5.81 (MIL/uL)    Hemoglobin 14.9  13.0 - 17.0 (g/dL)    HCT 21.3  08.6 - 57.8 (%)  MCV 93.0  78.0 - 100.0 (fL)    MCH 31.8  26.0 - 34.0 (pg)    MCHC 34.2  30.0 - 36.0 (g/dL)    RDW 16.1  09.6 - 04.5 (%)    Platelets 164  150 - 400 (K/uL)   DIFFERENTIAL     Status: Abnormal   Collection Time   07/28/11  9:25 PM      Component Value Range Comment   Neutrophils Relative 77  43 - 77 (%)    Neutro Abs 9.0 (*) 1.7 - 7.7 (K/uL)    Lymphocytes Relative 15  12 - 46 (%)    Lymphs Abs 1.7  0.7 - 4.0 (K/uL)    Monocytes Relative 8  3 - 12 (%)    Monocytes Absolute 1.0  0.1 - 1.0 (K/uL)    Eosinophils Relative 0  0 - 5 (%)    Eosinophils Absolute 0.0  0.0 - 0.7 (K/uL)    Basophils Relative 0  0 - 1 (%)    Basophils Absolute 0.0  0.0 - 0.1 (K/uL)   BASIC METABOLIC PANEL     Status: Abnormal   Collection Time   07/28/11  9:25 PM      Component Value Range Comment   Sodium 134 (*) 135 - 145 (mEq/L)    Potassium 3.9  3.5 - 5.1 (mEq/L)    Chloride 102  96 - 112 (mEq/L)    CO2 24  19 - 32 (mEq/L)    Glucose, Bld 161 (*) 70 - 99 (mg/dL)    BUN 12  6 - 23 (mg/dL)    Creatinine, Ser 4.09  0.50 - 1.35 (mg/dL)    Calcium 9.2  8.4 - 10.5 (mg/dL)    GFR calc non Af Amer 49 (*) >90 (mL/min)    GFR calc Af Amer 57 (*) >90 (mL/min)   URINALYSIS, ROUTINE W REFLEX MICROSCOPIC     Status: Abnormal   Collection Time   07/28/11  9:43 PM      Component Value Range Comment   Color, Urine RED (*) YELLOW  BIOCHEMICALS MAY BE AFFECTED BY COLOR   APPearance TURBID (*) CLEAR     Specific Gravity, Urine 1.026  1.005 - 1.030     pH 5.5  5.0 - 8.0     Glucose, UA 100 (*) NEGATIVE (mg/dL)    Hgb urine dipstick LARGE (*) NEGATIVE     Bilirubin Urine MODERATE (*) NEGATIVE     Ketones, ur 15 (*) NEGATIVE (mg/dL)    Protein, ur >811 (*) NEGATIVE (mg/dL)    Urobilinogen, UA 1.0  0.0 - 1.0 (mg/dL)    Nitrite POSITIVE (*) NEGATIVE     Leukocytes, UA LARGE (*) NEGATIVE    URINE MICROSCOPIC-ADD ON     Status: Normal   Collection Time   07/28/11  9:43 PM      Component Value Range Comment   RBC / HPF  TOO NUMEROUS TO COUNT  <3 (RBC/hpf)    Urine-Other FIELD OBSCURED BY RBC'S      No results found.  Review of Systems  Constitutional: Positive for malaise/fatigue.  HENT: Negative.   Eyes: Negative.   Respiratory: Negative.   Cardiovascular: Negative.   Gastrointestinal: Negative.   Genitourinary: Positive for dysuria and hematuria.  Musculoskeletal: Negative.   Skin: Negative.   Neurological: Negative.   Endo/Heme/Allergies: Negative.   Psychiatric/Behavioral: Negative.     Blood pressure 98/60, pulse 93, temperature 97.7 F (36.5 C), temperature source Oral, resp. rate  18, SpO2 93.00%. Physical Exam  Constitutional: He appears well-developed and well-nourished.       Here with wife  HENT:  Head: Normocephalic and atraumatic.  Eyes: EOM are normal. Pupils are equal, round, and reactive to light.  Neck: Normal range of motion. Neck supple.  Cardiovascular: Normal rate and regular rhythm.   Respiratory: Effort normal and breath sounds normal.  GI: Soft. Bowel sounds are normal. He exhibits no mass. There is tenderness. There is no rebound and no guarding.  Genitourinary: Penis normal.  Musculoskeletal: Normal range of motion.  Neurological: He is alert.  Skin: Skin is warm and dry.  Psychiatric: He has a normal mood and affect. His behavior is normal. Judgment and thought content normal.     Assessment/Plan  1 - Hematuria / Pelvic Pain After Recent TURBT - First priority to establish foley in proper position. New 24 F three way placed at bedside w/o difficulty with immediate return of 200cc lightly bloody urine. Irrigates quantitatively w/o clots. Hematuria unimpressive at this point. Pain still present and likely c/w bladder spasms.  As has had 2 ER visits in past 24 hours, will admit for observation with close monitoring of urine ouput. No irrigation for now. Emperic ABX as UA c/w possible infection.  2 - Bladder Cancer - path pending. Further decisions (cystectomy,  re-TURBT) pending histologic diagnosis. CT without distant disease per report.  Pius Byrom 07/29/2011, 4:40 PM

## 2011-07-29 NOTE — ED Notes (Signed)
Pt with leg bag on teaching done with pt and family. Good return demo on leg bag usuage.

## 2011-07-29 NOTE — ED Notes (Signed)
Urologist at bedside.

## 2011-07-30 LAB — CBC
HCT: 41.8 % (ref 39.0–52.0)
Hemoglobin: 14 g/dL (ref 13.0–17.0)
MCH: 31.3 pg (ref 26.0–34.0)
MCHC: 33.5 g/dL (ref 30.0–36.0)
MCV: 93.3 fL (ref 78.0–100.0)
RDW: 13.8 % (ref 11.5–15.5)

## 2011-07-30 LAB — BASIC METABOLIC PANEL
BUN: 11 mg/dL (ref 6–23)
Calcium: 8.8 mg/dL (ref 8.4–10.5)
Creatinine, Ser: 1.17 mg/dL (ref 0.50–1.35)
GFR calc non Af Amer: 56 mL/min — ABNORMAL LOW (ref >90)
Glucose, Bld: 149 mg/dL — ABNORMAL HIGH (ref 70–99)

## 2011-07-30 LAB — URINE CULTURE

## 2011-07-30 MED ORDER — BELLADONNA ALKALOIDS-OPIUM 16.2-60 MG RE SUPP
1.0000 | Freq: Four times a day (QID) | RECTAL | Status: DC | PRN
  Administered 2011-07-31 – 2011-08-01 (×2): 1 via RECTAL
  Filled 2011-07-30 (×2): qty 1

## 2011-07-30 MED ORDER — ZOLPIDEM TARTRATE 5 MG PO TABS: 5.0000 mg | ORAL_TABLET | Freq: Every evening | ORAL | Status: DC | PRN

## 2011-07-30 MED ORDER — BISACODYL 10 MG RE SUPP
10.0000 mg | Freq: Every day | RECTAL | Status: DC | PRN
  Administered 2011-07-30: 10 mg via RECTAL
  Filled 2011-07-30: qty 1

## 2011-07-30 MED ORDER — PHENOL 1.4 % MT LIQD
1.0000 | OROMUCOSAL | Status: DC | PRN
  Filled 2011-07-30: qty 177

## 2011-07-30 MED ORDER — BACITRACIN-NEOMYCIN-POLYMYXIN 400-5-5000 EX OINT
1.0000 "application " | TOPICAL_OINTMENT | Freq: Three times a day (TID) | CUTANEOUS | Status: DC | PRN
  Administered 2011-08-02: 1 via TOPICAL
  Filled 2011-07-30: qty 1

## 2011-07-30 MED ORDER — MENTHOL 3 MG MT LOZG
1.0000 | LOZENGE | OROMUCOSAL | Status: DC | PRN
  Filled 2011-07-30: qty 9

## 2011-07-30 MED ORDER — BISACODYL 10 MG RE SUPP
10.0000 mg | Freq: Once | RECTAL | Status: AC
  Administered 2011-07-30: 10 mg via RECTAL
  Filled 2011-07-30: qty 1

## 2011-07-30 NOTE — Progress Notes (Signed)
CC: Hospital Day 2 for hematuria   Subjective: No events overnight. No catheter problems or clots which required irrigation. Pt denies discomfort.  Objective: Vital signs in last 24 hours: Temp:  [97.7 F (36.5 C)-98.7 F (37.1 C)] 98.4 F (36.9 C) (12/09 0431) Pulse Rate:  [76-93] 79  (12/09 0431) Resp:  [16-18] 18  (12/09 0431) BP: (98-125)/(56-73) 120/64 mmHg (12/09 0431) SpO2:  [90 %-94 %] 92 % (12/09 0431) Weight:  [85.6 kg (188 lb 11.4 oz)] 188 lb 11.4 oz (85.6 kg) (12/08 1819) Last BM Date: 07/27/11  Intake/Output from previous day: 12/08 0701 - 12/09 0700 In: 474.6 [I.V.:474.6] Out: 500 [Urine:500] Intake/Output this shift: Total I/O In: 474.6 [I.V.:474.6] Out: 500 [Urine:500]  General appearance: alert, cooperative and appears stated age Eyes: conjunctivae/corneas clear. PERRL, EOM's intact. Fundi benign. Resp: clear to auscultation bilaterally Cardio: regular rate and rhythm, S1, S2 normal, no murmur, click, rub or gallop GI: soft, non-tender; bowel sounds normal; no masses,  no organomegaly Male genitalia: normal, penis: no lesions or discharge. testes: no masses or tenderness. no hernias Extremities: extremities normal, atraumatic, no cyanosis or edema and Homans sign is negative, no sign of DVT Pulses: 2+ and symmetric Skin: Skin color, texture, turgor normal. No rashes or lesions Neurologic: Grossly normal Foley c/d/i with dark yellow urine. No clots.   Lab Results:   Basename 07/30/11 0350 07/28/11 2125  WBC 11.4* 11.7*  HGB 14.0 14.9  HCT 41.8 43.6  PLT 187 164   BMET  Basename 07/30/11 0350 07/28/11 2125  NA 134* 134*  K 3.9 3.9  CL 102 102  CO2 25 24  GLUCOSE 149* 161*  BUN 11 12  CREATININE 1.17 1.30  CALCIUM 8.8 9.2   PT/INR No results found for this basename: LABPROT:2,INR:2 in the last 72 hours ABG No results found for this basename: PHART:2,PCO2:2,PO2:2,HCO3:2 in the last 72 hours  Studies/Results: No results  found.  Anti-infectives: Anti-infectives     Start     Dose/Rate Route Frequency Ordered Stop   07/29/11 2200   ciprofloxacin (CIPRO) tablet 250 mg        250 mg Oral 2 times daily 07/29/11 1821            Assessment/Plan: 1 - Hematuria - No clots overnight. Stable hgb. UOP excellent. Catheter w/o problems. Will plan for trial of void as soon as tomorrow.  2 - Bladder Cancer - path pending.  3 - Remain in house. Possible DC in PM vs. Tomorrow AM.   Berneice Heinrich, Ruffin Lada 07/30/2011

## 2011-07-30 NOTE — ED Provider Notes (Signed)
Medical screening examination/treatment/procedure(s) were performed by non-physician practitioner and as supervising physician I was immediately available for consultation/collaboration.   Julio Storr M Jaliyah Fotheringham, MD 07/30/11 0040 

## 2011-07-31 MED ORDER — SODIUM CHLORIDE 0.9 % IV SOLN
INTRAVENOUS | Status: DC
  Administered 2011-07-31 – 2011-08-02 (×6): via INTRAVENOUS

## 2011-07-31 MED ORDER — BISACODYL 10 MG RE SUPP
10.0000 mg | Freq: Two times a day (BID) | RECTAL | Status: DC
  Administered 2011-07-31 – 2011-08-02 (×3): 10 mg via RECTAL
  Filled 2011-07-31 (×2): qty 1

## 2011-07-31 MED ORDER — ENSURE CLINICAL ST REVIGOR PO LIQD
237.0000 mL | Freq: Three times a day (TID) | ORAL | Status: DC
  Administered 2011-07-31: 237 mL via ORAL
  Administered 2011-08-01: 21:00:00 via ORAL
  Administered 2011-08-01 – 2011-08-02 (×5): 237 mL via ORAL
  Administered 2011-08-03: 18:00:00 via ORAL
  Administered 2011-08-03 – 2011-08-04 (×2): 237 mL via ORAL

## 2011-07-31 MED ORDER — SENNOSIDES-DOCUSATE SODIUM 8.6-50 MG PO TABS
1.0000 | ORAL_TABLET | Freq: Two times a day (BID) | ORAL | Status: DC
  Administered 2011-07-31 – 2011-08-01 (×4): 1 via ORAL
  Filled 2011-07-31 (×10): qty 1

## 2011-07-31 MED ORDER — POLYETHYLENE GLYCOL 3350 17 G PO PACK
17.0000 g | PACK | Freq: Every day | ORAL | Status: DC
  Administered 2011-07-31 – 2011-08-01 (×2): 17 g via ORAL
  Filled 2011-07-31 (×8): qty 1

## 2011-07-31 MED ORDER — BIOTENE DRY MOUTH MT LIQD
15.0000 mL | Freq: Two times a day (BID) | OROMUCOSAL | Status: DC
  Administered 2011-07-31 – 2011-08-07 (×14): 15 mL via OROMUCOSAL

## 2011-07-31 NOTE — Progress Notes (Signed)
Urology Progress Note  Subjective:     No acute urologic events overnight.  Poor urine output yesterday, but improved over the past 12 hours.  Patient complains of constipation; had small BM yesterday.  He has not been ambulatory.  Catheter flushes in and out easily. No gross hematuria.  Objective:  Patient Vitals for the past 24 hrs:  BP Temp Temp src Pulse Resp SpO2  07/31/11 0451 130/75 mmHg 98 F (36.7 C) Oral 74  18  90 %  07/30/11 1959 138/81 mmHg 98.2 F (36.8 C) Oral 87  17  96 %  07/30/11 1403 129/74 mmHg 99.4 F (37.4 C) - 88  16  93 %    Physical Exam: General:  No acute distress, awake Cardiovascular:    [x]   S1/S2 present, RRR  []   Irregularly irregular Chest:  CTA-B Abdomen:               [x]  Soft, appropriately TTP, mild to moderate distention, no rebound TTP  []  Soft, NTTP  []  Soft, appropriately TTP, incision(s) clean/dry/intact  Genitourinary: Uncircumcised, foreskin reduced Foley:  Draining clear yellow urine.    I/O last 3 completed shifts: In: 1363.6 [P.O.:360; I.V.:1003.6] Out: 1150 [Urine:1150]  Recent Labs  [facility] 07/30/11 0350 07/28/11 2125   HGB 14.0 14.9   WBC 11.4* 11.7*   PLT 187 164    Recent Labs  Basename 07/30/11 0350 07/28/11 2125   NA 134* 134*   K 3.9 3.9   CL 102 102   CO2 25 24   BUN 11 12   CREATININE 1.17 1.30   CALCIUM 8.8 9.2   GFRNONAA 56* 49*   GFRAA 65* 57*     No results found for this basename: PT:2,INR:2,APTT:2 in the last 72 hours   No components found with this basename: ABG:2     Assessment:  Hospital Day #3 Post-op TURBT readmit for clot retention. Plan: -Push PO fluids, change from hypotonic solution to NS and increase IV fluid rate to 110 mL/hr of NS. -Ambulate in hall with nurse.  PT consult to evaluate ambulation. -Up out of bed to chair. -SCD while in bed; reinforced usage if these with patient. -Bowel regiment with scheduled suppository. -Discontinue antibiotic; urine culture  negative. -Continue foley catheter due to recent history of false passage and need for accurate I/O's.   Natalia Leatherwood, MD [phone]

## 2011-07-31 NOTE — Progress Notes (Signed)
Physical Therapy Evaluation Patient Details Name: DEVARIO BUCKLEW MRN: [id number] DOB: 1926/08/20 Today's Date: 07/31/2011 11:47-12:00 EVI  Pt with limited participation with PT today due to constipation, but unwilling to walk or do more.  He may need ST-SNF prior to D/C if mobility does not improve. Addendum:  Attempted PT again for gait around 1500, pt again refused.  RN aware  Problem List:  Patient Active Problem List  Diagnoses  . COLONIC POLYPS  . HYPERLIPIDEMIA  . ACTION TREMOR  . BENIGN POSITIONAL VERTIGO  . HYPERTENSION  . 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 HYPOTENSION, ORTHOSTATIC  . GERD  . DIVERTICULOSIS, COLON  . HYPERTROPHY PROSTATE W/UR OBST & OTH LUTS  . TORTICOLLIS  . MEMORY LOSS  . EDEMA- LOCALIZED  . PALPITATIONS  . DYSPHAGIA, OROPHARYNGEAL PHASE  . URINARY FREQUENCY  . FASTING HYPERGLYCEMIA  . Hypocalcemia  . Bladder cancer    Past Medical History:  Past Medical History  Diagnosis Date  . DIVERTICULOSIS, COLON 10/07/2006    pt. has freq boutw with constipation alternates with diarrhea  . HYPERLIPIDEMIA 10/07/2006  . GERD 10/06/2008  . HYPERTROPHY PROSTATE W/UR OBST & OTH LUTS 06/03/2009  . TORTICOLLIS 10/27/2009  . Memory loss 08/12/2007  . Palpitations 03/22/2010  . ACTION TREMOR 08/12/2007  . Meralgia paraesthetica   . Syncope   . Urosepsis 2007    with syncope  . Prostatitis 2007  . HYPERTENSION 10/07/2006  . Skin cancer   . Microscopic hematuria 07-24-11    no visual blood, last UTI was 10'12,tx. with antibiotic  . Vertigo 07-24-11    x2   . Bladder cancer    Past Surgical History:  Past Surgical History  Procedure Date  . Tonsillectomy   . Colonoscopy      Diverticulosis  . Cholecystectomy 07-24-11    approx. 4 yrs ago(attacks)  . Cataract extraction, bilateral 07-24-11    bilateral lens implants    PT Assessment/Plan/Recommendation PT Assessment Clinical Impression Statement: 77 yo with complications post bladder  resection last week , now unable to walk and with limited participation with PT.  Progress may be slowed and he will need SNF prior to D/C to home. PT Recommendation/Assessment: Patient will need skilled PT in the acute care venue PT Problem List: Decreased strength;Decreased activity tolerance;Decreased balance;Decreased mobility;Decreased knowledge of use of DME Problem List Comments: pt very anxious about walking... doesn't think he can do it Barriers to Discharge: Decreased caregiver support PT Therapy Diagnosis : Difficulty walking;Abnormality of gait;Generalized weakness PT Plan PT Frequency: Min 3X/week PT Treatment/Interventions: DME instruction;Gait training;Functional mobility training;Therapeutic activities;Therapeutic exercise;Patient/family education PT Recommendation Recommendations for Other Services: OT consult Follow Up Recommendations: Skilled nursing facility;24 hour supervision/assistance Equipment Recommended: Rolling walker with 5" wheels;3 in 1 bedside comode PT Goals  Acute Rehab PT Goals PT Goal Formulation: With patient/family Time For Goal Achievement: 2 weeks Pt will go Supine/Side to Sit: with supervision PT Goal: Supine/Side to Sit - Progress: Not met Pt will go Sit to Stand: with supervision PT Goal: Sit to Stand - Progress: Not met Pt will go Stand to Sit: with supervision PT Goal: Stand to Sit - Progress: Not met Pt will Ambulate: 51 - 150 feet;with min assist PT Goal: Ambulate - Progress: Not met Pt will Go Up / Down Stairs: 1-2 stairs;with min assist;with least restrictive assistive device PT Goal: Up/Down Stairs - Progress: Not met Pt will Perform Home Exercise Program: with min assist PT Goal: Perform Home Exercise Program - Progress:  Not met  PT Evaluation Precautions/Restrictions    Prior Functioning  Home Living Lives With: Spouse Receives Help From: Family Type of Home: House Home Layout: One level Home Access: Stairs to enter Entrance  Stairs-Rails: None Entrance Stairs-Number of Steps: 2 Home Adaptive Equipment: None Additional Comments: wife indicated pt has been doing little at home Prior Function Level of Independence: Needs assistance with gait;Needs assistance with tranfers Comments: pt with OR last week, prior to that was doing OK Cognition Cognition Arousal/Alertness: Awake/alert Overall Cognitive Status: Appears within functional limits for tasks assessed Orientation Level: Oriented X4 Cognition - Other Comments: pt appears uncomfortable but with vague complaints,  He says he does not want to walk despite much encouragement. He has frequent burping and only wants to get to the [facility] Sensation/Coordination   Extremity Assessment RLE Assessment RLE Assessment: Within Functional Limits LLE Assessment LLE Assessment: Within Functional Limits Mobility (including Balance) Bed Mobility Bed Mobility: Yes Rolling Left: 4: Min assist Supine to Sit: 4: Min assist Sit to Supine - Right: 4: Min assist Transfers Transfers: Yes Sit to Stand Details (indicate cue type and reason): needs extra direction to get to [facility] Ambulation/Gait Ambulation/Gait:  (pt deferred  "too weak"")    Exercise    End of Session PT - End of Session Activity Tolerance: Treatment limited secondary to medical complications (Comment) (constipation) Patient left: with family/visitor present (on [facility], RN notified) Nurse Communication: Mobility status for transfers General Behavior During Session: Flat affect Cognition: WFL for tasks performed  Donnetta Hail 07/31/2011, 4:03 PM

## 2011-07-31 NOTE — Progress Notes (Signed)
INITIAL ADULT NUTRITION ASSESSMENT Date: 07/31/2011   Time: 2:19 PM Reason for Assessment: Consult  ASSESSMENT: Male 77 y.o.  Dx: Hematuria, pelvic pain, bladder cancer  Hx:  Past Medical History  Diagnosis Date  . DIVERTICULOSIS, COLON 10/07/2006    pt. has freq boutw with constipation alternates with diarrhea  . HYPERLIPIDEMIA 10/07/2006  . GERD 10/06/2008  . HYPERTROPHY PROSTATE W/UR OBST & OTH LUTS 06/03/2009  . TORTICOLLIS 10/27/2009  . Memory loss 08/12/2007  . Palpitations 03/22/2010  . ACTION TREMOR 08/12/2007  . Meralgia paraesthetica   . Syncope   . Urosepsis 2007    with syncope  . Prostatitis 2007  . HYPERTENSION 10/07/2006  . Skin cancer   . Microscopic hematuria 07-24-11    no visual blood, last UTI was 10'12,tx. with antibiotic  . Vertigo 07-24-11    x2   . Bladder cancer    Related Meds:  Scheduled Meds:   . antiseptic oral rinse  15 mL Mouth Rinse BID  . bisacodyl  10 mg Rectal Once  . bisacodyl  10 mg Rectal BID  . metoprolol tartrate  12.5 mg Oral BID  . pantoprazole  40 mg Oral Q1200  . polyethylene glycol  17 g Oral Daily  . senna-docusate  1 tablet Oral BID  . DISCONTD: ciprofloxacin  250 mg Oral BID  . DISCONTD: docusate sodium  100 mg Oral BID  . DISCONTD: senna  1 tablet Oral BID  . DISCONTD: senna-docusate  2 tablet Oral QHS   Continuous Infusions:   . sodium chloride 110 mL/hr at 07/31/11 0824  . DISCONTD: dextrose 5 % and 0.45 % NaCl with KCl 10 mEq/L 1,000 mL (07/31/11 0146)   PRN Meds:.HYDROcodone-acetaminophen, hyoscyamine, menthol-cetylpyridinium, neomycin-bacitracin-polymyxin, ondansetron, opium-belladonna, phenazopyridine, phenol, zolpidem, DISCONTD: bisacodyl  Ht: 5\' 9"  (175.3 cm)  Wt: 188 lb 11.4 oz (85.6 kg)  Ideal Wt: 72.7kg % Ideal Wt: 118  Usual Wt: 88.6kg % Usual Wt: 97  Body mass index is 27.87 kg/(m^2).  Food/Nutrition Related Hx: Pt reports poor intake since bladder surgery on 07/27/11 but c/o poor appetite for the  past 1-2 months. Pt denies any problems chewing or swallowing. Pt c/o dry mouth and foods not tasting good. Pt reports using Biotene for over a year, and that it only sometimes help with dry mouth. Pt not on any nutritional supplements PTA and not interested in any during admission.   Labs:  CMP     Component Value Date/Time   NA 134* 07/30/2011 0350   K 3.9 07/30/2011 0350   CL 102 07/30/2011 0350   CO2 25 07/30/2011 0350   GLUCOSE 149* 07/30/2011 0350   BUN 11 07/30/2011 0350   CREATININE 1.17 07/30/2011 0350   CALCIUM 8.8 07/30/2011 0350   CALCIUM 10.1 02/21/2011 1418   PROT 6.6 02/24/2009 0821   ALBUMIN 3.7 02/24/2009 0821   AST 24 02/24/2009 0821   ALT 11 02/24/2009 0821   ALKPHOS 37* 02/24/2009 0821   BILITOT 1.3* 02/24/2009 0821   GFRNONAA 56* 07/30/2011 0350   GFRAA 65* 07/30/2011 0350    Intake/Output Summary (Last 24 hours) at 07/31/11 1423 Last data filed at 07/31/11 4540  Gross per 24 hour  Intake    529 ml  Output    400 ml  Net    129 ml   Last BM - today per pt report  Diet Order: General  IVF:    sodium chloride Last Rate: 110 mL/hr at 07/31/11 0824  DISCONTD: dextrose 5 % and  0.45 % NaCl with KCl 10 mEq/L Last Rate: 1,000 mL (07/31/11 0146)    Estimated Nutritional Needs:   Kcal:1850-2150 Protein:105-120g Fluid:1.8-2.1L  NUTRITION DIAGNOSIS: -Inadequate oral intake (NI-2.1).  Status: Ongoing  RELATED TO: poor appetite/taste changes  AS EVIDENCE BY: pt statement, <50% meal intake, weight loss  MONITORING/EVALUATION(Goals): Pt to consume >75% of meals.   EDUCATION NEEDS: -Education needs addressed. Reviewed nutrition strategies for dry mouth, taste changes, and poor appetite.   INTERVENTION: Encouraged use of Biotene and hard candy to help with dry mouth. Encouraged use of additional packets of salt/pepper to add more flavor to foods. Encouraged pt to try different items on menu to see what pt likes. Pt not interested in supplements or snacks. Will monitor.    Dietitian # [phone]  DOCUMENTATION CODES Per approved criteria  -Not Applicable    Marshall Cork 07/31/2011, 2:19 PM

## 2011-07-31 NOTE — Progress Notes (Signed)
Patient still unwilling to ambulate this evening despite encouragement.  Patient states he just does not feel like he can do it.  However, patient did get up to bedside commode x 2 with one person assist and had 2 small mucous bowel movements.  Patient also drank all of his chocolate Ensure and ate small amount of his dinner without complaints of nausea.  Encouraged patient to sit up more in the bed to assist with bowel motility.  Educated patient that he must work with Physical Therapy tomorrow to assist with his strengthening.  Patient verbalized understanding.  Patient offered emotional support and encouragement.  Patient's wife also at bedside and she has also been encouraging patient.  Allayne Butcher [facility]  07/31/2011  7:32 PM

## 2011-07-31 NOTE — Plan of Care (Signed)
Problem: Phase II Progression Outcomes Goal: Ambulate in halls BID Outcome: Not Progressing Pt refuses ambulation with PT Ebony Hail, PT

## 2011-07-31 NOTE — Progress Notes (Signed)
GU  Patient with positive results from bowel regiment with flatus.  Reports some relief of abdominal discomfort with the flatus.  He is not compliant with nutritional recommendations from dietitian; refuses to work with physical therapy.  I offered to have a chaplain see the patient but he refuses.  PE: Filed Vitals:   07/31/11 1405  BP: 143/70  Pulse: 81  Temp: 98.5 F (36.9 C)  Resp: 16   Gen: AAO Abdomen: Soft, distended CV: RRR  A/P: Failure to thrive.  Constipation.  -Will increase IV fluid rate due to poor PO intake. -Add Ensure shakes to meals. -Encouraged ambulation and work with PT. -Check labs in morning.

## 2011-08-01 ENCOUNTER — Observation Stay (HOSPITAL_COMMUNITY): Payer: Medicare Other

## 2011-08-01 ENCOUNTER — Encounter (HOSPITAL_COMMUNITY): Payer: Self-pay | Admitting: Urology

## 2011-08-01 LAB — CBC
Hemoglobin: 13.3 g/dL (ref 13.0–17.0)
MCH: 31.1 pg (ref 26.0–34.0)
MCHC: 33.6 g/dL (ref 30.0–36.0)
Platelets: 194 10*3/uL (ref 150–400)
RDW: 13.5 % (ref 11.5–15.5)

## 2011-08-01 LAB — BASIC METABOLIC PANEL
Calcium: 8.6 mg/dL (ref 8.4–10.5)
GFR calc Af Amer: 88 mL/min — ABNORMAL LOW (ref >90)
GFR calc non Af Amer: 76 mL/min — ABNORMAL LOW (ref >90)
Glucose, Bld: 113 mg/dL — ABNORMAL HIGH (ref 70–99)
Sodium: 130 mEq/L — ABNORMAL LOW (ref 135–145)

## 2011-08-01 LAB — CLOSTRIDIUM DIFFICILE BY PCR: Toxigenic C. Difficile by PCR: NEGATIVE

## 2011-08-01 MED ORDER — POTASSIUM CHLORIDE CRYS ER 20 MEQ PO TBCR
40.0000 meq | EXTENDED_RELEASE_TABLET | Freq: Once | ORAL | Status: AC
  Administered 2011-08-01: 40 meq via ORAL
  Filled 2011-08-01: qty 2

## 2011-08-01 MED ORDER — POTASSIUM CHLORIDE 10 MEQ/100ML IV SOLN
10.0000 meq | INTRAVENOUS | Status: DC
Start: 2011-08-01 — End: 2011-08-01
  Administered 2011-08-01: 10 meq via INTRAVENOUS
  Filled 2011-08-01 (×4): qty 100

## 2011-08-01 MED ORDER — POTASSIUM CHLORIDE CRYS ER 20 MEQ PO TBCR
20.0000 meq | EXTENDED_RELEASE_TABLET | Freq: Two times a day (BID) | ORAL | Status: DC
  Administered 2011-08-01: 20 meq via ORAL
  Filled 2011-08-01 (×2): qty 1

## 2011-08-01 MED ORDER — IOHEXOL 300 MG/ML  SOLN
100.0000 mL | Freq: Once | INTRAMUSCULAR | Status: AC | PRN
  Administered 2011-08-01: 100 mL via INTRAVENOUS

## 2011-08-01 NOTE — Consult Note (Signed)
Requesting physician: Dr Natalia Leatherwood, Urologist.  Reason for consultation: Weakness, FTT, deconditioning, hyponatremia and hypokalemia.  History of Present Illness: This is an 77 year old male, with history of bladder Cancer, s/p large volume transurethral resection of bladder tumor 07/27/11 for clinically localized bladder cancer. This was partially resected with plan for staged resection depending on final pathology.. This was discovered on w/u of irritative voiding symptoms. Patient presented twice to ED 07/28/11-07/29/11, with hematuria with clots, pelvic pain and catheter problems. He was eventually admitted, and per Dr Margarita Grizzle, has stabilized from Urologic view point. We are requested to consult for continuing FTT, weakness and electrolyte abnormalities, with a view to taking over care .   Allergies:   Allergies  Allergen Reactions  . Ramipril Other (See Comments)    Pt doesn't remember if he is allergic to.   . Tamsulosin Other (See Comments)    Unknown   . Cephalexin Other (See Comments)    Bloody loose stools      Past Medical History  Diagnosis Date  . DIVERTICULOSIS, COLON 10/07/2006    pt. has freq boutw with constipation alternates with diarrhea  . HYPERLIPIDEMIA 10/07/2006  . GERD 10/06/2008  . HYPERTROPHY PROSTATE W/UR OBST & OTH LUTS 06/03/2009  . TORTICOLLIS 10/27/2009  . Memory loss 08/12/2007  . Palpitations 03/22/2010  . ACTION TREMOR 08/12/2007  . Meralgia paraesthetica   . Syncope   . Urosepsis 2007    with syncope  . Prostatitis 2007  . HYPERTENSION 10/07/2006  . Skin cancer   . Microscopic hematuria 07-24-11    no visual blood, last UTI was 10'12,tx. with antibiotic  . Vertigo 07-24-11    x2   . Bladder cancer     Past Surgical History  Procedure Date  . Tonsillectomy   . Colonoscopy      Diverticulosis  . Cholecystectomy 07-24-11    approx. 4 yrs ago(attacks)  . Cataract extraction, bilateral 07-24-11    bilateral lens implants  . Transurethral  resection of bladder tumor 07/27/2011    Procedure: TRANSURETHRAL RESECTION OF BLADDER TUMOR (TURBT);  Surgeon: Anner Crete;  Location: [facility];  Service: Urology;  Laterality: N/A;  Cystoscopy/Transurethral Resection of Bladder Tumor    Scheduled Meds:   . antiseptic oral rinse  15 mL Mouth Rinse BID  . bisacodyl  10 mg Rectal BID  . feeding supplement  237 mL Oral TID WC  . metoprolol tartrate  12.5 mg Oral BID  . pantoprazole  40 mg Oral Q1200  . polyethylene glycol  17 g Oral Daily  . potassium chloride  40 mEq Oral Once  . potassium chloride  40 mEq Oral Once  . senna-docusate  1 tablet Oral BID  . DISCONTD: potassium chloride  10 mEq Intravenous Q1 Hr x 4  . DISCONTD: potassium chloride  20 mEq Oral BID   Continuous Infusions:   . sodium chloride 125 mL/hr at 08/01/11 0944   PRN Meds:.HYDROcodone-acetaminophen, hyoscyamine, menthol-cetylpyridinium, neomycin-bacitracin-polymyxin, ondansetron, opium-belladonna, phenazopyridine, phenol, zolpidem  Social History:  reports that he quit smoking about 37 years ago. He has never used smokeless tobacco. He reports that he drinks alcohol. He reports that he does not use illicit drugs.  Family History  Problem Relation Age of Onset  . Heart attack Father 72  . Colon cancer Mother 62  . Hypertension Mother   . Cancer Mother     Colon Cancer  . Breast cancer Maternal Grandmother   . Cancer Maternal Grandmother  Breast Cancer  . Heart attack Maternal Grandfather     late 8s  . Heart failure Sister   . Heart disease Sister     CHF    Review of Systems:  As per HPI. Patent denies weight loss, fever, chills, headache, blurred vision, difficulty in speaking, dysphagia, chest pain, cough, shortness of breath, orthopnea, paroxysmal nocturnal dyspnea, nausea, diaphoresis,. He has constipation, abdomina discomfort, and lately, has passed liquid stools. No vomiting, hematemesis, melena, lower extremity swelling, pain, or redness. The  rest of the systems review is negative.  Physical Exam: Blood pressure 148/73, pulse 70, temperature 98.4 F (36.9 C), temperature source Oral, resp. rate 15, height 5\' 9"  (1.753 m), weight 85.6 kg (188 lb 11.4 oz), SpO2 93.00%.  General:  Patient does not appear to be in obvious acute distress. Alert, communicative, fully oriented, talking in complete sentences, not short of breath at rest.  HEENT:  No clinical pallor, no jaundice, no conjunctival injection or discharge. Hydration status is satisfactory. NECK:  Supple, JVP not seen, no carotid bruits, no palpable lymphadenopathy, no palpable goiter. CHEST:  Clinically clear to auscultation, no wheezes, no crackles. HEART:  Sounds 1 and 2 heard, normal, regular, no murmurs. ABDOMEN:  Obese, softly distended and tympanitic. Diffuse discomfort to palpation, unable to palpate organs.normal bowel sounds. GENITALIA:  Not examined. Patient has Foley catheter, with dark urine. LOWER EXTREMITIES:  No pitting edema, palpable peripheral pulses. MUSCULOSKELETAL SYSTEM:  Generalized osteoarthritic changes, otherwise, normal. CENTRAL NERVOUS SYSTEM:  No focal neurologic deficit on gross examination.  Labs on Admission:  Results for orders placed during the hospital encounter of 07/29/11 (from the past 48 hour(s))  CBC     Status: Normal   Collection Time   08/01/11  3:45 AM      Component Value Range Comment   WBC 9.1  4.0 - 10.5 (K/uL)    RBC 4.28  4.22 - 5.81 (MIL/uL)    Hemoglobin 13.3  13.0 - 17.0 (g/dL)    HCT 21.3  08.6 - 57.8 (%)    MCV 92.5  78.0 - 100.0 (fL)    MCH 31.1  26.0 - 34.0 (pg)    MCHC 33.6  30.0 - 36.0 (g/dL)    RDW 46.9  62.9 - 52.8 (%)    Platelets 194  150 - 400 (K/uL)   BASIC METABOLIC PANEL     Status: Abnormal   Collection Time   08/01/11  3:45 AM      Component Value Range Comment   Sodium 130 (*) 135 - 145 (mEq/L)    Potassium 3.1 (*) 3.5 - 5.1 (mEq/L)    Chloride 100  96 - 112 (mEq/L)    CO2 23  19 - 32 (mEq/L)     Glucose, Bld 113 (*) 70 - 99 (mg/dL)    BUN 9  6 - 23 (mg/dL)    Creatinine, Ser 4.13  0.50 - 1.35 (mg/dL)    Calcium 8.6  8.4 - 10.5 (mg/dL)    GFR calc non Af Amer 76 (*) >90 (mL/min)    GFR calc Af Amer 88 (*) >90 (mL/min)     Radiological Exams on Admission: US Renal  08/01/2011  *RADIOLOGY REPORT*  Clinical Data: Poor urine output.  Rule out hydronephrosis.  RENAL/URINARY TRACT ULTRASOUND COMPLETE  Comparison:  CT of 06/30/2011  Findings:  Right Kidney:  11.0 cm. No hydronephrosis.  5.3 cm upper pole renal cyst.  Left Kidney:  10.6 cm. No hydronephrosis.  Bladder:  Collapsed  around a Foley catheter.  IMPRESSION: No hydronephrosis.  Original Report Authenticated By: Consuello Bossier, M.D.   Dg Abd 2 Views  08/01/2011  *RADIOLOGY REPORT*  Clinical Data: Constipation with abdominal distention.  Bladder surgery 1 week ago.  Small bowel obstruction.  Diarrhea.  ABDOMEN - 2 VIEW  Comparison: CT of 06/30/2011.  Findings: Right-sided decubitus views demonstrate numerous air- fluid levels within bowel loops.  No free intraperitoneal air.  Supine views demonstrate gas filled bowel loops throughout the abdomen and upper pelvis.  Peripheral location suggests primarily colonic air.  No pneumatosis.  Probable sigmoid gas.  IMPRESSION: Numerous air-fluid levels and diffuse gas-filled bowel.  Favored to primarily represent colonic air, indicative of postoperative ileus. No pneumatosis or free intraperitoneal air identified.  Original Report Authenticated By: Consuello Bossier, M.D.    Assessment/Plan Active Problems: 1.  Bladder cancer: patient is now on post-op day # 5. Per urology team, he is progressing as expected. Will defer management to Urology. 2. Hyponatremia. This is borderline to very mild, and certainly, would not account for patient's debility and FTT. He is currently on ivi NS. I would recommend reducing this to maintenance of 75 cc/hr, for now. 3. Diarrhea: Etiology unclear. Certainly, w/u  for C. Difficle is warranted, and will be implemented, although patient appears to have been on an aggressive bowel regimen.Marland Kitchen 4. Abdominal distention: Abdominal X-Ray of 12/11/2, shows finding, consistent with ileus, and patient complains of constipation, despite bowel regimen. I feel an abdominal CT scan at the very least, is warranted. Perhaps, this is fecal impaction with overflow, or something more sinister. 5. FTT: Patient states that he just doesn't feel like eating. I suspect this will improve with resolution of his acute problems. 6. Depression: Per RN, patient's spouse states that patient has been depressed, even prior to hospitalization. Antidepressant trial in due course, or even Psychiatry consultation, may be worthwhile.  7. Deconditioning. Continue PT/OT  Note: Background problems appear stable. For completeness, we shall send off a urine culture, to rule out UTI and check TSH.  Comment: Medical team will take over care as primary, per Urologist's request.  Time Spent on Consultation: 45 mins.  Balraj Brayfield,CHRISTOPHER 08/01/2011, 12:42 PM

## 2011-08-01 NOTE — Progress Notes (Signed)
Physical Therapy Treatment Patient Details Name: TIBURCIO LINDER MRN: [id number] DOB: 1926-06-25 Today's Date: 08/01/2011 1500-1530 GE  PT Assessment/Plan  PT - Assessment/Plan Comments on Treatment Session: pt needs encouragement, but has good L/E strength and has good potential for ambulation PT Plan: Discharge plan remains appropriate;Frequency remains appropriate PT Goals  Acute Rehab PT Goals PT Goal: Supine/Side to Sit - Progress: Progressing toward goal PT Goal: Sit to Stand - Progress: Progressing toward goal PT Goal: Stand to Sit - Progress: Progressing toward goal PT Goal: Ambulate - Progress: Progressing toward goal PT Goal: Perform Home Exercise Program - Progress: Progressing toward goal  PT Treatment Precautions/Restrictions  Precautions Precaution Comments: pt with abdominal problems r/o ileus Restrictions Weight Bearing Restrictions: No Mobility (including Balance) Bed Mobility Rolling Left: 4: Min assist Supine to Sit: 4: Min assist Transfers Sit to Stand Details (indicate cue type and reason):  pt with c/o pain from catheter Ambulation/Gait Ambulation/Gait: Yes Ambulation/Gait Assistance: 4: Min assist Ambulation/Gait Assistance Details (indicate cue type and reason): pt needs encouragement, c/o pain Ambulation Distance (Feet): 15 Feet Assistive device: Rolling walker Gait Pattern: Step-through pattern Gait velocity: slow Stairs: No  Posture/Postural Control Posture/Postural Control: Postural limitations Postural Limitations: pt leans forward, increased abdominal girth Balance Balance Assessed: No Exercise  General Exercises - Upper Extremity Shoulder Flexion: Other (comment) (bilateral hip to hip for core activation) General Exercises - Lower Extremity Ankle Circles/Pumps: AROM;10 reps Quad Sets: AROM;10 reps Gluteal Sets: AROM;10 reps Long Arc Quad: 10 reps End of Session PT - End of Session Equipment Utilized During Treatment: Other (comment)  (RW) Activity Tolerance: Treatment limited secondary to medical complications (Comment);Other (comment) (abdominal and catheter pain) Patient left: in bed Nurse Communication: Mobility status for transfers;Mobility status for ambulation General Behavior During Session: Flat affect Cognition: WFL for tasks performed  Donnetta Hail 08/01/2011, 3:44 PM

## 2011-08-01 NOTE — Progress Notes (Signed)
Urology Progress Note  Subjective:     No acute urologic events overnight. Patient with continued flatus.  Had small BM this morning. Mostly complains of constipation.  Still no significant ambulation.  Objective:  Patient Vitals for the past 24 hrs:  BP Temp Temp src Pulse Resp SpO2  08/01/11 0512 148/73 mmHg 98.4 F (36.9 C) Oral 70  15  93 %  07/31/11 2148 138/71 mmHg 99.5 F (37.5 C) Oral 93  16  93 %  07/31/11 1405 143/70 mmHg 98.5 F (36.9 C) Oral 81  16  92 %    Physical Exam: General:  No acute distress, awake Cardiovascular:    [x]   S1/S2 present, RRR  []   Irregularly irregular Chest:  Equal effort bilaterally Abdomen:  Distention is moderate and unchanged from yesterday. Soft. No rebound TTP.             Genitourinary: Uncircumcised, catheter in place. Foley:  Draining concentrated urine.    I/O: Input: 2562 mL Urine output: 700 mL  Recent Labs  Basename 08/01/11 0345 07/30/11 0350   HGB 13.3 14.0   WBC 9.1 11.4*   PLT 194 187    Recent Labs  Basename 08/01/11 0345 07/30/11 0350   NA 130* 134*   K 3.1* 3.9   CL 100 102   CO2 23 25   BUN 9 11   CREATININE 0.93 1.17   CALCIUM 8.6 8.8   GFRNONAA 76* 56*   GFRAA 88* 65*     No results found for this basename: PT:2,INR:2,APTT:2 in the last 72 hours   No components found with this basename: ABG:2     Assessment: Hospital Day #4  Transurethral resection of bladder tumor on 07-27-11.  Now with failure to thrive, hyponatremia, hypokalemia, constipation, and dehydration. Plan: -Replace potassium PO and IV. -Fluid restriction for hyponatremia. -Encourage ambulation. -Use SCD's for DVT prophylaxis.  Would avoid anticoagulation as this could cause bleeding from the bladder. -Consult Hospitalist for transfer of care due to multiple medical issues at this time. Discussed with Dr. Brien Few. -Will obtain renal US to look for etiology for poor urine output. -Abdominal x-ray due to constipation.    Natalia Leatherwood, MD [phone]

## 2011-08-01 NOTE — Progress Notes (Addendum)
Patient receiving first dose of IV potassium.  Patient with severe burning at IV site and red streaking up vein.  Rate slowed without resolution.  Potassium stopped and IV flushed with normal saline and burning and streaking resolved.  Dr. Margarita Grizzle notified paged.  Awaiting return call.  Allayne Butcher [facility]  08/01/2011  11:22 AM   Call returned and orders received.  Allayne Butcher [facility]    08/01/2011  11:30 AM  .

## 2011-08-01 NOTE — Progress Notes (Signed)
Received return call from Dr. Balinda Quails at [facility] advising inpatient status.  Phone call  placed to Dr. Margarita Grizzle.  I left message for return phone call to give information.  Received return phone call from Ashely who took information from me to share with Dr. Margarita Grizzle.  Nylan Nakatani RNC-MNN, BSN, [phone].

## 2011-08-02 DIAGNOSIS — E871 Hypo-osmolality and hyponatremia: Secondary | ICD-10-CM | POA: Diagnosis present

## 2011-08-02 LAB — COMPREHENSIVE METABOLIC PANEL
Albumin: 2.6 g/dL — ABNORMAL LOW (ref 3.5–5.2)
Alkaline Phosphatase: 47 U/L (ref 39–117)
BUN: 8 mg/dL (ref 6–23)
Creatinine, Ser: 0.99 mg/dL (ref 0.50–1.35)
GFR calc Af Amer: 85 mL/min — ABNORMAL LOW (ref >90)
Glucose, Bld: 105 mg/dL — ABNORMAL HIGH (ref 70–99)
Potassium: 3.6 mEq/L (ref 3.5–5.1)
Total Protein: 5.5 g/dL — ABNORMAL LOW (ref 6.0–8.3)

## 2011-08-02 LAB — CBC
HCT: 40.5 % (ref 39.0–52.0)
Hemoglobin: 13.8 g/dL (ref 13.0–17.0)
MCH: 31.3 pg (ref 26.0–34.0)
MCHC: 34.1 g/dL (ref 30.0–36.0)
MCV: 91.8 fL (ref 78.0–100.0)
RDW: 13.5 % (ref 11.5–15.5)

## 2011-08-02 LAB — URINE CULTURE: Culture  Setup Time: 201212120109

## 2011-08-02 LAB — TSH: TSH: 0.77 u[IU]/mL (ref 0.350–4.500)

## 2011-08-02 MED ORDER — BACITRACIN-NEOMYCIN-POLYMYXIN OINTMENT TUBE
TOPICAL_OINTMENT | Freq: Three times a day (TID) | CUTANEOUS | Status: DC
  Administered 2011-08-02 (×3): via TOPICAL
  Administered 2011-08-03: 1 via TOPICAL
  Administered 2011-08-03 – 2011-08-07 (×11): via TOPICAL
  Filled 2011-08-02: qty 15

## 2011-08-02 NOTE — Progress Notes (Signed)
Urology Progress Note  Subjective:     No acute urologic events. Patient had two bowel movements since I say him yesterday afternoon.  CT consistent with ileus.  Patient complains of catheter discomfort.  Objective:  Patient Vitals for the past 24 hrs:  BP Temp Temp src Pulse Resp SpO2  08/01/11 2316 153/80 mmHg 98 F (36.7 C) Oral 94  16  95 %  08/01/11 2216 153/82 mmHg - - 94  - -  08/01/11 1355 155/80 mmHg 98.7 F (37.1 C) Oral 86  16  96 %    Physical Exam: General:  No acute distress, awake Cardiovascular:    [ x ]  S1/S2 present, RRR  [  ]  Irregularly irregular Chest:  CTA-B Abdomen:  Moderate distention (unchanged), soft, no rebound TTP              Genitourinary:  Uncircumcised and foreskin reduced. Foley:  Draining clear urine.    I/O last 3 completed shifts: In: 2562.5 [I.V.:2562.5] Out: 1000 [Urine:1000]     Assessment: Transurethral resection of bladder tumor by Dr. Annabell Howells 07/27/11.  Now with ileus and failure to thrive. Plan: -Will add neosporin to urethral meatus for catheter irritation. -He has levsin for bladder spasms. -Encourage ambulation and nutritional intake. -Continue catheter.   Natalia Leatherwood, MD [phone]

## 2011-08-02 NOTE — Progress Notes (Signed)
[facility] HOSPITALIST progress note    Interval h/o:- 77 year old male, with history of bladder Cancer, s/p large volume transurethral resection of bladder tumor 07/27/11 for clinically localized bladder cancer. This was partially resected with plan for staged resection depending on final pathology.. This was discovered on w/u of irritative voiding symptoms. Patient presented twice to ED 07/28/11-07/29/11, with hematuria with clots, pelvic pain and catheter problems. He was eventually admitted, and per Dr Margarita Grizzle, has stabilized from Urologic view point. We are requested to consult for continuing FTT, weakness and electrolyte abnormalities, with a view to taking over care .      Subjective: Ferels fair today.  Feels " a little better than yesterday"  Most of the pain is in the catheter with bowel pain.yhas passed stool "a little at a time" about 2-3 times today  Treatment Team:  Milford Cage, MD Objective: Vital signs in last 24 hours: Temp:  [98 F (36.7 C)-99.7 F (37.6 C)] 98.1 F (36.7 C) (12/12 1300) Pulse Rate:  [76-94] 83  (12/12 1300) Resp:  [16-18] 18  (12/12 1300) BP: (122-153)/(66-82) 151/82 mmHg (12/12 1300) SpO2:  [92 %-95 %] 94 % (12/12 1300) Weight change:   Intake/Output Summary (Last 24 hours) at 08/02/11 1539 Last data filed at 08/02/11 1357  Gross per 24 hour  Intake 2088.33 ml  Output   1100 ml  Net 988.33 ml    BP 151/82  Pulse 83  Temp(Src) 98.1 F (36.7 C) (Oral)  Resp 18  Ht 5\' 9"  (1.753 m)  Wt 85.6 kg (188 lb 11.4 oz)  BMI 27.87 kg/m2  SpO2 94% General appearance: alert and cooperative Throat: lips, mucosa, and tongue normal; teeth and gums normal Lungs: clear to auscultation bilaterally and normal percussion bilaterally Heart: regular rate and rhythm, S1, S2 normal, no murmur, click, rub or gallop Abdomen: soft, non-tender; bowel sounds normal; no masses,  no organomegaly Pulses: 2+ and symmetric  Lab Results:  [facility] 08/02/11 0356  08/01/11 0345  NA 132* 130*  K 3.6 3.1*  CL 101 100  CO2 25 23  GLUCOSE 105* 113*  BUN 8 9  CREATININE 0.99 0.93  CALCIUM 8.6 8.6  MG -- --  PHOS -- --    Basename 08/02/11 0356  AST 22  ALT 18  ALKPHOS 47  BILITOT 0.5  PROT 5.5*  ALBUMIN 2.6*   No results found for this basename: LIPASE:2,AMYLASE:2 in the last 72 hours  Basename 08/02/11 0356 08/01/11 0345  WBC 10.2 9.1  NEUTROABS -- --  HGB 13.8 13.3  HCT 40.5 39.6  MCV 91.8 92.5  PLT 210 194   No results found for this basename: CKTOTAL:3,CKMB:3,CKMBINDEX:3,TROPONINI:3 in the last 72 hours No components found with this basename: POCBNP:3 No results found for this basename: DDIMER:2 in the last 72 hours No results found for this basename: HGBA1C:2 in the last 72 hours No results found for this basename: CHOL:2,HDL:2,LDLCALC:2,TRIG:2,CHOLHDL:2,LDLDIRECT:2 in the last 72 hours  Basename 08/02/11 0356  TSH 0.770  T4TOTAL --  T3FREE --  THYROIDAB --   No results found for this basename: VITAMINB12:2,FOLATE:2,FERRITIN:2,TIBC:2,IRON:2,RETICCTPCT:2 in the last 72 hours Micro Results: Recent Results (from the past 240 hour(s))  SURGICAL PCR SCREEN     Status: Normal   Collection Time   07/24/11  2:14 PM      Component Value Range Status Comment   MRSA, PCR NEGATIVE  NEGATIVE  Final    Staphylococcus aureus NEGATIVE  NEGATIVE  Final   URINE CULTURE  Status: Normal   Collection Time   07/28/11  9:43 PM      Component Value Range Status Comment   Specimen Description URINE, CATHETERIZED   Final    Special Requests Normal   Final    Setup Time [id number]   Final    Colony Count NO GROWTH   Final    Culture NO GROWTH   Final    Report Status 07/30/2011 FINAL   Final   CLOSTRIDIUM DIFFICILE BY PCR     Status: Normal   Collection Time   08/01/11  2:08 PM      Component Value Range Status Comment   C difficile by pcr NEGATIVE  NEGATIVE  Final           Medications: I have reviewed the patient's current  medications. Scheduled Meds:   . antiseptic oral rinse  15 mL Mouth Rinse BID  . bisacodyl  10 mg Rectal BID  . feeding supplement  237 mL Oral TID WC  . metoprolol tartrate  12.5 mg Oral BID  . neomycin-bacitracin-polymyxin   Topical TID  . pantoprazole  40 mg Oral Q1200  . polyethylene glycol  17 g Oral Daily  . potassium chloride  40 mEq Oral Once  . senna-docusate  1 tablet Oral BID   Continuous Infusions:   . sodium chloride 75 mL/hr at 08/02/11 1121   PRN Meds:.HYDROcodone-acetaminophen, hyoscyamine, iohexol, menthol-cetylpyridinium, neomycin-bacitracin-polymyxin, ondansetron, opium-belladonna, phenazopyridine, phenol, zolpidem   Assessment/Plan: 1. Bladder cancer: patient is now on post-op day # 6. Per urology team, he is progressing as expected. Will defer management to Urology. Path from 12/6 pending-patient asks questions re: "is this cancer" would defer to Urology to address  2. Hyponatremia. This is borderline to very mild, and certainly, would not account for patient's debility and FTT.  Will d/c NS and reassess bmet in am  3. Diarrhea: Etiology unclear. C. Difficle is neg-simplify bowel regimen . 4. Abdominal distention: Abdominal X-Ray of 12/11/2, shows finding, consistent with ileus, and patient complains of constipation, despite bowel regimen. abdominal Ct unremarkable for acute issue   5. FTT: Patient states that he just doesn't feel like eating. I suspect this will improve with resolution of his acute problems.   6. Depression: Per RN, patient's spouse states that patient has been depressed, even prior to hospitalization. Antidepressant trial as out-patient-get Chaplain involved if worse tomorrow.  7. Deconditioning. Continue PT/OT   LOS: 4 days   Karanveer Ramakrishnan,JAI 08/02/2011, 3:39 PM

## 2011-08-03 DIAGNOSIS — D090 Carcinoma in situ of bladder: Secondary | ICD-10-CM | POA: Diagnosis present

## 2011-08-03 LAB — BASIC METABOLIC PANEL
Calcium: 8.3 mg/dL — ABNORMAL LOW (ref 8.4–10.5)
Chloride: 101 mEq/L (ref 96–112)
Creatinine, Ser: 0.97 mg/dL (ref 0.50–1.35)
GFR calc Af Amer: 86 mL/min — ABNORMAL LOW (ref >90)
Sodium: 132 mEq/L — ABNORMAL LOW (ref 135–145)

## 2011-08-03 LAB — OCCULT BLOOD X 1 CARD TO LAB, STOOL: Fecal Occult Bld: NEGATIVE

## 2011-08-03 MED ORDER — POTASSIUM CHLORIDE CRYS ER 20 MEQ PO TBCR
20.0000 meq | EXTENDED_RELEASE_TABLET | Freq: Once | ORAL | Status: AC
  Administered 2011-08-03: 20 meq via ORAL
  Filled 2011-08-03: qty 1

## 2011-08-03 MED ORDER — METOPROLOL TARTRATE 25 MG PO TABS
25.0000 mg | ORAL_TABLET | Freq: Two times a day (BID) | ORAL | Status: DC
  Administered 2011-08-03 – 2011-08-07 (×8): 25 mg via ORAL
  Filled 2011-08-03 (×9): qty 1

## 2011-08-03 NOTE — Progress Notes (Signed)
Met with Pt and wife, Gwynn, regarding D/c plans.  Pt and wife feel that Pt can be managed in the home.  Notified RNCM.  Yellow Note in shadow chart.  CSW to sign off.  Providence Crosby, LCSWA Clinical Social Work [phone]

## 2011-08-03 NOTE — Progress Notes (Signed)
Urology Progress Note  Subjective:     No acute urologic events overnight. Patient working more with PT.  Patient states he feels better today.  Positive flatus and liquid bowel movements.  Complains of catheter discomfort.  Objective:  Patient Vitals for the past 24 hrs:  BP Temp Temp src Pulse Resp SpO2  08/03/11 0514 111/66 mmHg 97.3 F (36.3 C) Oral 72  18  94 %  08/02/11 2133 137/67 mmHg 98.1 F (36.7 C) Oral 80  20  96 %  08/02/11 1300 151/82 mmHg 98.1 F (36.7 C) Oral 83  18  94 %    Physical Exam: General:  No acute distress, awake Cardiovascular:    [x]   S1/S2 present, RRR  []   Irregularly irregular Chest:  CTA-B Abdomen:  Moderate distention is slightly improved from yesterday.  Soft.  No rebound TTP.            Genitourinary: Foreskin edema, foreskin reduced. Foley:  Draining clear yellow urine.    I/O last 3 completed shifts: In: 2088.3 [P.O.:480; I.V.:1608.3] Out: 1625 [Urine:1625]  Recent Labs  [facility] 08/02/11 0356 08/01/11 0345   HGB 13.8 13.3   WBC 10.2 9.1   PLT 210 194    Recent Labs  Basename 08/03/11 0341 08/02/11 0356   NA 132* 132*   K 3.0* 3.6   CL 101 101   CO2 25 25   BUN 8 8   CREATININE 0.97 0.99   CALCIUM 8.3* 8.6   GFRNONAA 74* 74*   GFRAA 86* 85*     No results found for this basename: PT:2,INR:2,APTT:2 in the last 72 hours   No components found with this basename: ABG:2     Assessment: TURBT 07/27/11.  Failure to thrive. Plan: -Appreciate Hospitalist Team taking over primary care of patient.  -Voiding trial today: catheter removed at 07:30.  Order for nurse to scan bladder and page GU MD if he has not voided by 11:30 am.  -Discontinued levsin and B&O suppositories as these will contribute to urinary retention.  Pyridium on prn medication list.  -Discussed results of pathology with patient today.  Explained it is a cancer that would likely need further treatment.       Natalia Leatherwood, MD [phone]

## 2011-08-03 NOTE — Progress Notes (Signed)
CARE MANAGEMENT NOTE 08/03/2011  Patient:  [name], [name]   Account Number:  [id number]  Date Initiated:  08/03/2011  Documentation initiated by:  DAVIS,RHONDA  Subjective/Objective Assessment:   admitted with hematuria and urinary blockage     Action/Plan:   home   Anticipated DC Date:  08/06/2011   Anticipated DC Plan:  HOME W HOME HEALTH SERVICES  In-house referral  Clinical Social Worker      DC Planning Services  CM consult      [facility] Choice  NA   Choice offered to / List presented to:  C-1 Patient   DME arranged  NA      DME agency  NA     HH arranged  NA      HH agency  NA   Status of service:  In process, will continue to follow Medicare Important Message given?   (If response is "NO", the following Medicare IM given date fields will be blank) Date Medicare IM given:   Date Additional Medicare IM given:    Discharge Disposition:    Per UR Regulation:  Reviewed for med. necessity/level of care/duration of stay  Comments:  [id number]/Rhonda Davis,RN,BSN,CCM discussed dc plans with patient and family wants to go home is willing to use [facility]hc if needed.  List given to patient and wife.

## 2011-08-03 NOTE — Progress Notes (Signed)
[facility] H[facility]PITAL[facility] progress note    Interval h/o:- 77 year old male, with history of bladder Cancer, s/p large volume transurethral resection of bladder tumor 07/27/11 for clinically localized bladder cancer. This was partially resected with plan for staged resection depending on final pathology.. This was discovered on w/u of irritative voiding symptoms. Patient presented twice to ED 07/28/11-07/29/11, with hematuria with clots, pelvic pain and catheter problems. He was eventually admitted, and per Dr Margarita Grizzle, has stabilized from Urologic view point. We are requested to consult for continuing FTT, weakness and electrolyte abnormalities, with a view to taking over care .   Subjective: finds he has frequency.  "hits me all at once"  Abd pain is better.. Had some stool as well.  Like dark stool-black coffee grounds-per patient report  Treatment Team:  Milford Cage, MD Objective: Vital signs in last 24 hours: Temp:  [97.3 F (36.3 C)-98.6 F (37 C)] 98.6 F (37 C) (12/13 1400) Pulse Rate:  [72-80] 72  (12/13 1400) Resp:  [18-20] 18  (12/13 1400) BP: (111-145)/(66-80) 145/80 mmHg (12/13 1400) SpO2:  [94 %-96 %] 95 % (12/13 1400) Weight change:   Intake/Output Summary (Last 24 hours) at 08/03/11 1652 Last data filed at 08/03/11 0900  Gross per 24 hour  Intake     60 ml  Output    575 ml  Net   -515 ml    BP 145/80  Pulse 72  Temp(Src) 98.6 F (37 C) (Oral)  Resp 18  Ht 5\' 9"  (1.753 m)  Wt 85.6 kg (188 lb 11.4 oz)  BMI 27.87 kg/m2  SpO2 95% General appearance: alert and cooperative  Throat: lips, mucosa, and tongue normal; teeth and gums normal  Lungs: clear to auscultation bilaterally and normal percussion bilaterally  Heart: regular rate and rhythm, S1, S2 normal, no murmur, click, rub or gallop  Abdomen: soft, slightly distended; bowel sounds normal; no masses, no organomegaly  Pulses: 2+ and symmetric   Lab Results:  [facility] 08/03/11 0341 08/02/11 0356   NA 132* 132*  K 3.0* 3.6  CL 101 101  CO2 25 25  GLUCOSE 104* 105*  BUN 8 8  CREATININE 0.97 0.99  CALCIUM 8.3* 8.6  MG -- --  PHOS -- --    Basename 08/02/11 0356  AST 22  ALT 18  ALKPHOS 47  BILITOT 0.5  PROT 5.5*  ALBUMIN 2.6*   No results found for this basename: LIPASE:2,AMYLASE:2 in the last 72 hours  Basename 08/02/11 0356 08/01/11 0345  WBC 10.2 9.1  NEUTROABS -- --  HGB 13.8 13.3  HCT 40.5 39.6  MCV 91.8 92.5  PLT 210 194     Basename 08/02/11 0356  TSH 0.770  T4TOTAL --  T3FREE --  THYROIDAB --   No results found for this basename: VITAMINB12:2,FOLATE:2,FERRITIN:2,TIBC:2,IRON:2,RETICCTPCT:2 in the last 72 hours Micro Results: Recent Results (from the past 240 hour(s))  URINE CULTURE     Status: Normal   Collection Time   07/28/11  9:43 PM      Component Value Range Status Comment   Specimen Description URINE, CATHETERIZED   Final    Special Requests Normal   Final    Setup Time [id number]   Final    Colony Count NO GROWTH   Final    Culture NO GROWTH   Final    Report Status 07/30/2011 FINAL   Final   CLOSTRIDIUM DIFFICILE BY PCR     Status: Normal   Collection Time  08/01/11  2:08 PM      Component Value Range Status Comment   C difficile by pcr NEGATIVE  NEGATIVE  Final   URINE CULTURE     Status: Normal   Collection Time   08/01/11  4:41 PM      Component Value Range Status Comment   Specimen Description URINE, CATHETERIZED   Final    Special Requests NONE   Final    Setup Time [id number]   Final    Colony Count NO GROWTH   Final    Culture NO GROWTH   Final    Report Status 08/02/2011 FINAL   Final    Medications: I have reviewed the patient's current medications. Scheduled Meds:   . antiseptic oral rinse  15 mL Mouth Rinse BID  . bisacodyl  10 mg Rectal BID  . feeding supplement  237 mL Oral TID WC  . metoprolol tartrate  12.5 mg Oral BID  . neomycin-bacitracin-polymyxin   Topical TID  . pantoprazole  40 mg Oral Q1200  .  polyethylene glycol  17 g Oral Daily  . senna-docusate  1 tablet Oral BID   Continuous Infusions:  PRN Meds:.HYDROcodone-acetaminophen, menthol-cetylpyridinium, neomycin-bacitracin-polymyxin, ondansetron, phenazopyridine, phenol, zolpidem, DISCONTD: hyoscyamine, DISCONTD: opium-belladonna   Assessment/Plan: Patient Active Hospital Problem List: Bladder cancer s/p resection TURBT 12/6 (07/27/2011) Per urologist-appreciate Dr. Aaron Edelman' expert opinion re: therapy options for what appears to be wide-spread transitional Cell ca  Hyponatremia. This is borderline to very mild-Review labs am   Hypokalemia-replace orally with KDUR 20  Diarrhea: Etiology unclear. C. Difficle is neg-simplify bowel regimen -state he has been having dark "coffee-ground stool" Will guaiac stools and get CBC am--hold purgative laxative Miralax  Abdominal distention: Abdominal X-Ray of 12/11/2, shows finding, consistent with ileus, and patient complains of constipation, despite bowel regimen. abdominal Ct unremarkable for acute issue   FTT: Patient states that he just doesn't feel like eating. I suspect this will improve with resolution of his acute problems.    Depression: Per RN, patient's spouse states that patient has been depressed, even prior to hospitalization. Antidepressant trial as out-patient  Htn-increase metoprolol to 25 bid and reassess  Deconditioning. Continue PT/OT-likely can d/c home if stabilizes and no blood per rectum in 1-2 days    LOS: 5 days   [facility] 08/03/2011, 4:52 PM

## 2011-08-03 NOTE — Progress Notes (Signed)
Occupational Therapy Evaluation Patient Details Name: [name] MRN: [id number] DOB: Jun 03, 1926 Today's Date: 08/03/2011 Time in: 11:30 am Time out: 11:43 am Eval II  Problem List:  Patient Active Problem List  Diagnoses  . COLONIC POLYPS  . HYPERLIPIDEMIA  . ACTION TREMOR  . BENIGN POSITIONAL VERTIGO  . HYPERTENSION  . 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 HYPOTENSION, ORTHOSTATIC  . GERD  . DIVERTICULOSIS, COLON  . HYPERTROPHY PROSTATE W/UR OBST & OTH LUTS  . TORTICOLLIS  . MEMORY LOSS  . EDEMA- LOCALIZED  . PALPITATIONS  . DYSPHAGIA, OROPHARYNGEAL PHASE  . URINARY FREQUENCY  . FASTING HYPERGLYCEMIA  . Hypocalcemia  . Bladder cancer s/p resection 12/6  . Hyponatremia    Past Medical History:  Past Medical History  Diagnosis Date  . DIVERTICULOSIS, COLON 10/07/2006    pt. has freq boutw with constipation alternates with diarrhea  . HYPERLIPIDEMIA 10/07/2006  . GERD 10/06/2008  . HYPERTROPHY PROSTATE W/UR OBST & OTH LUTS 06/03/2009  . TORTICOLLIS 10/27/2009  . Memory loss 08/12/2007  . Palpitations 03/22/2010  . ACTION TREMOR 08/12/2007  . Meralgia paraesthetica   . Syncope   . Urosepsis 2007    with syncope  . Prostatitis 2007  . HYPERTENSION 10/07/2006  . Skin cancer   . Microscopic hematuria 07-24-11    no visual blood, last UTI was 10'12,tx. with antibiotic  . Vertigo 07-24-11    x2   . Bladder cancer    Past Surgical History:  Past Surgical History  Procedure Date  . Tonsillectomy   . Colonoscopy      Diverticulosis  . Cholecystectomy 07-24-11    approx. 4 yrs ago(attacks)  . Cataract extraction, bilateral 07-24-11    bilateral lens implants  . Transurethral resection of bladder tumor 07/27/2011    Procedure: TRANSURETHRAL RESECTION OF BLADDER TUMOR (TURBT);  Surgeon: Anner Crete;  Location: [facility];  Service: Urology;  Laterality: N/A;  Cystoscopy/Transurethral Resection of Bladder Tumor    OT Assessment/Plan/Recommendation OT  Assessment Clinical Impression Statement: Pt will benefit from OT services to increase independence with ADL for return home with spouse assist available 24/7. OT Recommendation/Assessment: Patient will need skilled OT in the acute care venue OT Problem List: Decreased strength;Decreased knowledge of use of DME or AE OT Therapy Diagnosis : Generalized weakness OT Plan OT Frequency: Min 2X/week OT Treatment/Interventions: Self-care/ADL training;Therapeutic activities;Patient/family education;DME and/or AE instruction OT Recommendation Follow Up Recommendations: None Equipment Recommended: None recommended by OT;Other (comment) (No BSC as pt already owns one.) Pt has a standard walker. PT recommends RW. Possibly could just get wheel attachments for his SW. Individuals Consulted Consulted and Agree with Results and Recommendations: Family member/caregiver;Patient Family Member Consulted: spouse OT Goals Acute Rehab OT Goals OT Goal Formulation: With patient/family Time For Goal Achievement: 2 weeks ADL Goals Pt Will Perform Grooming: Standing at sink;with supervision ADL Goal: Grooming - Progress: Progressing toward goals Pt Will Perform Lower Body Bathing: with supervision;Sit to stand from chair;Sit to stand from bed ADL Goal: Lower Body Bathing - Progress: Progressing toward goals Pt Will Perform Lower Body Dressing: with supervision;Sit to stand from bed;Sit to stand from chair ADL Goal: Lower Body Dressing - Progress: Progressing toward goals Pt Will Transfer to Toilet: with supervision;with DME;3-in-1;Ambulation;Other (comment) (RW PRN) ADL Goal: Toilet Transfer - Progress: Progressing toward goals Pt Will Perform Toileting - Clothing Manipulation: with supervision;Standing ADL Goal: Toileting - Clothing Manipulation - Progress: Progressing toward goals Pt Will Perform Toileting - Hygiene: Independently;Sitting on  3-in-1 or toilet ADL Goal: Toileting - Hygiene - Progress: Progressing  toward goals Pt Will Perform Tub/Shower Transfer: with supervision;with DME;Shower seat with back ADL Goal: Web designer - Progress: Progressing toward goals  OT Evaluation Precautions/Restrictions  Precautions Precaution Comments: pt with abdominal problems r/o ileus Restrictions Weight Bearing Restrictions: No Prior Functioning Home Living Lives With: Spouse Receives Help From: Family Type of Home: House Home Layout: One level Home Access: Stairs to enter Entrance Stairs-Rails: None Entrance Stairs-Number of Steps: 2 Bathroom Shower/Tub: Health visitor: Standard Home Adaptive Equipment: Bedside commode/3-in-1;Shower chair with back;Walker - standard Additional Comments: wife indicated pt has been doing little at home Prior Function Level of Independence: Needs assistance with gait;Needs assistance with tranfers;Independent with basic ADLs ADL ADL Grooming: Simulated;Set up Where Assessed - Grooming: Sitting, bed;Unsupported Upper Body Bathing: Simulated;Chest;Right arm;Left arm;Abdomen;Set up Where Assessed - Upper Body Bathing: Unsupported;Sitting, bed Lower Body Bathing: Minimal assistance;Other (comment) Lower Body Bathing Details (indicate cue type and reason): able to cross right leg up to simulate washing lower legs but unable to cross left leg up. States he is sore from where catheter was. Where Assessed - Lower Body Bathing: Sit to stand from bed Upper Body Dressing: Simulated;Set up Where Assessed - Upper Body Dressing: Sitting, bed;Unsupported Lower Body Dressing: Minimal assistance Where Assessed - Lower Body Dressing: Sit to stand from bed Toilet Transfer: Minimal assistance Toilet Transfer Details (indicate cue type and reason): min guard assist Toilet Transfer Method: Ambulating;Other (comment) (and RW) Acupuncturist: Other (comment) (RW) Toileting - Clothing Manipulation: Simulated;Minimal assistance Toileting - Clothing  Manipulation Details (indicate cue type and reason): min guard assist Where Assessed - Glass blower/designer Manipulation: Standing Toileting - Hygiene: Minimal assistance Toileting - Hygiene Details (indicate cue type and reason): min guard assist Where Assessed - Toileting Hygiene: Other (comment) (sit to stand from EOB) Tub/Shower Transfer: Not assessed Tub/Shower Transfer Method: Not assessed Equipment Used: Rolling walker ADL Comments: wife present and states she can provide assist at discharge. They are planning home with 24/7 PRN assist. Vision/Perception    Cognition Cognition Arousal/Alertness: Awake/alert Overall Cognitive Status: Appears within functional limits for tasks assessed Orientation Level: Oriented X4 Sensation/Coordination Sensation Light Touch: Appears Intact Extremity Assessment RUE Assessment RUE Assessment: Within Functional Limits (although note UE shaky with donning sock) LUE Assessment LUE Assessment: Within Functional Limits (although shaky with donning sock) Mobility  Transfers Transfers: Yes Sit to Stand: 4: Min assist;From bed Sit to Stand Details (indicate cue type and reason): min guard assist Exercises   End of Session OT - End of Session Equipment Utilized During Treatment: Other (comment) (RW) Activity Tolerance: Patient tolerated treatment well Patient left: in bed;with call bell in reach;with family/visitor present General Behavior During Session: [facility] for tasks performed Cognition: [facility] for tasks performed   Lennox Laity [phone] 08/03/2011, 12:19 PM

## 2011-08-03 NOTE — Progress Notes (Signed)
Patient ID: [name], male   DOB: 1926-07-29, 77 y.o.   MRN: [id number]   [name]'s foley was removed this AM.  He is voiding and has gone several times.  He reports no discomfort or difficulty but the urine is bloody.   He has persistent penile edema but no paraphimosis.  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   The disease was very extensive.  If he were younger and in better health, a cystectomy would be the most prudent options, but I don't think he would tolerate that surgery.  I this time, I believe he will be best served with a trial of BCG therapy.  He has a f/u appt scheduled with me Monday if he has been discharged by then and I will arrange the BCG therapy at that time.

## 2011-08-04 ENCOUNTER — Inpatient Hospital Stay (HOSPITAL_COMMUNITY): Payer: Medicare Other

## 2011-08-04 LAB — CBC
HCT: 41.6 % (ref 39.0–52.0)
Platelets: 196 10*3/uL (ref 150–400)
RBC: 4.52 MIL/uL (ref 4.22–5.81)
RDW: 13.8 % (ref 11.5–15.5)
WBC: 8.3 10*3/uL (ref 4.0–10.5)

## 2011-08-04 LAB — BASIC METABOLIC PANEL
CO2: 26 mEq/L (ref 19–32)
Chloride: 100 mEq/L (ref 96–112)
GFR calc Af Amer: 87 mL/min — ABNORMAL LOW (ref >90)
Potassium: 3.1 mEq/L — ABNORMAL LOW (ref 3.5–5.1)

## 2011-08-04 LAB — CLOSTRIDIUM DIFFICILE BY PCR: Toxigenic C. Difficile by PCR: NEGATIVE

## 2011-08-04 MED ORDER — POTASSIUM CHLORIDE CRYS ER 10 MEQ PO TBCR
20.0000 meq | EXTENDED_RELEASE_TABLET | Freq: Every day | ORAL | Status: DC
  Administered 2011-08-04: 20 meq via ORAL
  Filled 2011-08-04: qty 2
  Filled 2011-08-04: qty 1

## 2011-08-04 MED ORDER — POTASSIUM CHLORIDE CRYS ER 20 MEQ PO TBCR
20.0000 meq | EXTENDED_RELEASE_TABLET | Freq: Once | ORAL | Status: AC
  Administered 2011-08-04: 20 meq via ORAL
  Filled 2011-08-04: qty 1

## 2011-08-04 NOTE — Progress Notes (Signed)
Occupational Therapy Treatment Patient Details Name: [name] MRN: [id number] DOB: 1926/01/17 Today's Date: 08/04/2011 11:48-12:26  3sc OT Assessment/Plan OT Assessment/Plan Comments on Treatment Session: Pt making steady gains with OT. Overall exhibits function at a supervision level for tasks performed except for clothing management with toileting which was min assist.  Instructed pt and wife to use shower seat for safety at home. Still exhibits dynamic standing balance deficits, but are improving . OT Plan: Discharge plan remains appropriate OT Frequency: Min 2X/week Follow Up Recommendations: None Equipment Recommended: None recommended by OT OT Goals ADL Goals ADL Goal: Grooming - Progress: Met ADL Goal: Lower Body Bathing - Progress: Progressing toward goals ADL Goal: Lower Body Dressing - Progress: Met ADL Goal: Toilet Transfer - Progress: Met ADL Goal: Toileting - Clothing Manipulation - Progress: Progressing toward goals ADL Goal: Toileting - Hygiene - Progress: Progressing toward goals ADL Goal: Tub/Shower Transfer - Progress: Met  OT Treatment Precautions/Restrictions  Precautions Precautions: Fall   ADL ADL Grooming: Performed;Teeth care;Supervision/safety Where Assessed - Grooming: Standing at sink Lower Body Dressing: Performed;Supervision/safety Lower Body Dressing Details (indicate cue type and reason): Pt donned gripper socks in sitting. Where Assessed - Lower Body Dressing: Sitting, chair Toilet Transfer: Performed;Supervision/safety Toilet Transfer Method: Ambulating Toilet Transfer Equipment: Raised toilet seat with arms (or 3-in-1 over toilet) Toileting - Clothing Manipulation: Performed;Minimal assistance Where Assessed - Toileting Clothing Manipulation: Sit to stand from 3-in-1 or toilet Toileting - Hygiene: Performed;Supervision/safety Where Assessed - Toileting Hygiene: Sit to stand from 3-in-1 or toilet Tub/Shower Transfer:  Supervision/safety Tub/Shower Transfer Method: Science writer: Walk in shower Mobility  Transfers Transfers: Yes Sit to Stand: 5: Supervision Exercises    End of Session OT - End of Session Activity Tolerance: Patient tolerated treatment well Patient left: in chair;with call bell in reach;with family/visitor present General Behavior During Session: [facility] for tasks performed Cognition: [facility] for tasks performed  [name] OTR/L 08/04/2011, 1:21 PM Pager number [phone]

## 2011-08-04 NOTE — Progress Notes (Signed)
[facility] HOSPITALIST progress note    Interval h/o:- 77 year old male, with history of bladder Cancer, s/p large volume transurethral resection of bladder tumor 07/27/11 for clinically localized bladder cancer. This was partially resected with plan for staged resection depending on final pathology.. This was discovered on w/u of irritative voiding symptoms. Patient presented twice to ED 07/28/11-07/29/11, with hematuria with clots, pelvic pain and catheter problems. He was eventually admitted, and per Dr Margarita Grizzle, has stabilized from Urologic view point.  Had post-op ileus 12.11 and then was noted to have diarrhoea-Ct and AXR was neg Has developed copious stool-reported dark, but noted to be green 12.14 Blood counts have increased Urine is returning to yellow Colour Depressed but seems more oriented and happier than initial visits  Subjective: Stool x 4.  Feeld better. No Fever/chills/n/v/cp  Worried about penile swelling.  Pain mod controlled   Treatment Team:  Milford Cage, MD Objective: Vital signs in last 24 hours: Temp:  [98.2 F (36.8 C)-98.8 F (37.1 C)] 98.2 F (36.8 C) (12/14 0517) Pulse Rate:  [71-81] 71  (12/14 0517) Resp:  [18] 18  (12/14 0517) BP: (138-152)/(72-80) 138/72 mmHg (12/14 0517) SpO2:  [94 %-95 %] 94 % (12/14 0517) Weight change:   Intake/Output Summary (Last 24 hours) at 08/04/11 1249 Last data filed at 08/04/11 0500  Gross per 24 hour  Intake    120 ml  Output    525 ml  Net   -405 ml    BP 138/72  Pulse 71  Temp(Src) 98.2 F (36.8 C) (Oral)  Resp 18  Ht 5\' 9"  (1.753 m)  Wt 85.6 kg (188 lb 11.4 oz)  BMI 27.87 kg/m2  SpO2 94% General appearance: alert and cooperative  Throat: lips, mucosa, and tongue normal; teeth and gums normal  Lungs: clear to auscultation bilaterally and normal percussion bilaterally  Heart: regular rate and rhythm, S1, S2 normal, no murmur, click, rub or gallop  Abdomen: soft, slightly distended; bowel sounds  normal; no masses, no organomegaly  Pulses: 2+ and symmetric   Lab Results:  [facility] 08/04/11 0325 08/03/11 0341  NA 134* 132*  K 3.1* 3.0*  CL 100 101  CO2 26 25  GLUCOSE 96 104*  BUN 8 8  CREATININE 0.95 0.97  CALCIUM 8.5 8.3*  MG -- --  PHOS -- --    Basename 08/02/11 0356  AST 22  ALT 18  ALKPHOS 47  BILITOT 0.5  PROT 5.5*  ALBUMIN 2.6*   No results found for this basename: LIPASE:2,AMYLASE:2 in the last 72 hours  Basename 08/04/11 0325 08/02/11 0356  WBC 8.3 10.2  NEUTROABS -- --  HGB 14.2 13.8  HCT 41.6 40.5  MCV 92.0 91.8  PLT 196 210    Basename 08/02/11 0356  TSH 0.770  T4TOTAL --  T3FREE --  THYROIDAB --    Micro Results: Recent Results (from the past 240 hour(s))  URINE CULTURE     Status: Normal   Collection Time   07/28/11  9:43 PM      Component Value Range Status Comment   Specimen Description URINE, CATHETERIZED   Final    Special Requests Normal   Final    Setup Time [id number]   Final    Colony Count NO GROWTH   Final    Culture NO GROWTH   Final    Report Status 07/30/2011 FINAL   Final   CLOSTRIDIUM DIFFICILE BY PCR     Status: Normal   Collection Time  08/01/11  2:08 PM      Component Value Range Status Comment   C difficile by pcr NEGATIVE  NEGATIVE  Final   URINE CULTURE     Status: Normal   Collection Time   08/01/11  4:41 PM      Component Value Range Status Comment   Specimen Description URINE, CATHETERIZED   Final    Special Requests NONE   Final    Setup Time [id number]   Final    Colony Count NO GROWTH   Final    Culture NO GROWTH   Final    Report Status 08/02/2011 FINAL   Final    Medications: I have reviewed the patient's current medications. Scheduled Meds:   . antiseptic oral rinse  15 mL Mouth Rinse BID  . bisacodyl  10 mg Rectal BID  . feeding supplement  237 mL Oral TID WC  . metoprolol tartrate  25 mg Oral BID  . neomycin-bacitracin-polymyxin   Topical TID  . pantoprazole  40 mg Oral Q1200  .  polyethylene glycol  17 g Oral Daily  . potassium chloride  20 mEq Oral Once  . potassium chloride  20 mEq Oral Once  . DISCONTD: metoprolol tartrate  12.5 mg Oral BID  . DISCONTD: senna-docusate  1 tablet Oral BID   Continuous Infusions:  PRN Meds:.HYDROcodone-acetaminophen, menthol-cetylpyridinium, neomycin-bacitracin-polymyxin, ondansetron, phenazopyridine, phenol, zolpidem   Assessment/Plan:  Bladder cancer s/p resection TURBT 12/6 (07/27/2011) Per urologist-appreciate Dr. Aaron Edelman' expert opinion re: therapy options for what appears to be wide-spread transitional Cell ca.  Hyponatremia. This is borderline to very mild-Review labs am   Hypokalemia-replace orally with KDUR 20-could be from diarrhoea  Diarrhea: Etiology unclear. C. Difficle is neg-simplify bowel regimen -state he has been having dark "coffee-ground stool"-this was reviewed by me and was actually more green. Will guaiac stools/get CDIFF PCR again-although prior was neg  CBC am--hold purgative laxative and hold Miralax   Abdominal distention: Abdominal X-Ray of 12/11/2, shows finding, consistent with ileus, and patient complains of constipation, despite bowel regimen. abdominal Ct unremarkable for acute issue   FTT: Patient states that he just doesn't feel like eating. I suspect this will improve with resolution of his acute problems.   Depression: Per RN, patient's spouse states that patient has been depressed, even prior to hospitalization. Antidepressant trial as out-patient   Htn-increase metoprolol to 25 bid and reassess   Deconditioning. Continue PT/OT-likely can d/c home if stabilizes and no blood per rectum in 1-2 days   LOS: 6 days   [facility] 08/04/2011, 12:49 PM

## 2011-08-04 NOTE — Progress Notes (Signed)
Urology Progress Note  Subjective:     No acute urologic events. Patient continues to void on his own with minimal dysuria.  He states his urine is now turning yellow from blood-tinged. Positive flatus and liquid BM's.  Objective:  Patient Vitals for the past 24 hrs:  BP Temp Temp src Pulse Resp SpO2  08/04/11 0517 138/72 mmHg 98.2 F (36.8 C) Oral 71  18  94 %  08/03/11 2204 152/75 mmHg 98.8 F (37.1 C) Oral 81  18  94 %  08/03/11 1400 145/80 mmHg 98.6 F (37 C) Oral 72  18  95 %    Physical Exam: General:  No acute distress, awake Cardiovascular:    [ x ]  S1/S2 present, RRR  [  ]  Irregularly irregular Chest:  CTA-B Abdomen:               [  ] Soft, appropriately TTP  [ x ] Soft, NTTP, patient states his current state of distention is at his baseline.  [  ] Soft, appropriately TTP, incision(s) clean/dry/intact  Genitourinary:  No catheter in place. Positive genital edema.     I/O last 3 completed shifts: In: 180 [P.O.:180] Out: 1100 [Urine:1100]     Assessment: Bladder cancer. Transurethral resection 07/27/11.  Failure to thrive Plan: Continue to ambulate and work with PT. Encourage nutritional shakes. Patient voiding well.  Will follow up as outpatient with Dr. Annabell Howells for bladder cancer treatment.   Natalia Leatherwood, MD [phone]

## 2011-08-04 NOTE — Progress Notes (Signed)
Pt cancelled: Pt refused PT due to increased pain in penile area and presented extremely agitated upon PT arrival. RN notified about pain level.  Clovia Cuff, PT

## 2011-08-05 ENCOUNTER — Inpatient Hospital Stay (HOSPITAL_COMMUNITY): Payer: Medicare Other

## 2011-08-05 LAB — BASIC METABOLIC PANEL
BUN: 7 mg/dL (ref 6–23)
Chloride: 99 mEq/L (ref 96–112)
GFR calc Af Amer: 68 mL/min — ABNORMAL LOW (ref >90)
GFR calc non Af Amer: 59 mL/min — ABNORMAL LOW (ref >90)
Potassium: 3 mEq/L — ABNORMAL LOW (ref 3.5–5.1)
Sodium: 135 mEq/L (ref 135–145)

## 2011-08-05 MED ORDER — PHENAZOPYRIDINE HCL 100 MG PO TABS
100.0000 mg | ORAL_TABLET | Freq: Three times a day (TID) | ORAL | Status: DC
  Administered 2011-08-05 – 2011-08-07 (×8): 100 mg via ORAL
  Filled 2011-08-05 (×9): qty 1

## 2011-08-05 MED ORDER — TRAMADOL HCL 50 MG PO TABS: 50.0000 mg | ORAL_TABLET | Freq: Four times a day (QID) | ORAL | Status: DC | PRN

## 2011-08-05 MED ORDER — DARIFENACIN HYDROBROMIDE ER 7.5 MG PO TB24
7.5000 mg | ORAL_TABLET | Freq: Every day | ORAL | Status: DC
  Administered 2011-08-05 – 2011-08-07 (×3): 7.5 mg via ORAL
  Filled 2011-08-05 (×3): qty 1

## 2011-08-05 MED ORDER — POTASSIUM CHLORIDE CRYS ER 20 MEQ PO TBCR
40.0000 meq | EXTENDED_RELEASE_TABLET | Freq: Every day | ORAL | Status: DC
  Administered 2011-08-05 – 2011-08-06 (×2): 40 meq via ORAL
  Filled 2011-08-05 (×2): qty 2

## 2011-08-05 NOTE — Progress Notes (Signed)
Bladder scan shows post void residual of 43 ml.Hartley Barefoot

## 2011-08-05 NOTE — Progress Notes (Signed)
[facility] HOSPITALIST progress note    Interval h/o:- 77 year old male, with history of bladder Cancer, s/p large volume transurethral resection of bladder tumor 07/27/11 for clinically localized bladder cancer. This was partially resected with plan for staged resection depending on final pathology.. This was discovered on w/u of irritative voiding symptoms. Patient presented twice to ED 07/28/11-07/29/11, with hematuria with clots, pelvic pain and catheter problems. He was eventually admitted, and per Dr Margarita Grizzle, has stabilized from Urologic view point.  Had post-op ileus 12.11 and then was noted to have diarrhoea-Ct and AXR was neg  Has developed copious stool-reported dark, but noted to be green 12.14-Cdiff neg x 2 tests. NPO, and incontinent of oth stool and urine-reported this 12.15 and had had multiple episodes of Rectal incontienence since the surgery Blood counts have increased  Urine is returning to yellow Colour-Post-void residuals are low, and urology thinks he might have some incontinence of urine as well  Subjective: Fair-Still passing dark stool. Watery.  Not able to control this-has been ha[ppening for over 1 week.  States it was a yellowish colour this am Wishes to eat-was told by someone not to eat while on some medication<?> Frustrated at being here this long  Treatment Team:  Milford Cage, MD Objective: Vital signs in last 24 hours: Temp:  [97.8 F (36.6 C)-99.4 F (37.4 C)] 97.8 F (36.6 C) (12/15 0504) Pulse Rate:  [73-89] 73  (12/15 0504) Resp:  [17-19] 17  (12/15 0504) BP: (111-138)/(59-70) 135/70 mmHg (12/15 0504) SpO2:  [97 %] 97 % (12/15 0504) Weight change:   Intake/Output Summary (Last 24 hours) at 08/05/11 1357 Last data filed at 08/05/11 1200  Gross per 24 hour  Intake      0 ml  Output    400 ml  Net   -400 ml    BP 135/70  Pulse 73  Temp(Src) 97.8 F (36.6 C) (Oral)  Resp 17  Ht 5\' 9"  (1.753 m)  Wt 85.6 kg (188 lb 11.4 oz)  BMI  27.87 kg/m2  SpO2 97% General appearance: alert and cooperative  Throat: lips, mucosa, and tongue normal; teeth and gums normal  Lungs: clear to auscultation bilaterally and normal percussion bilaterally  Heart: regular rate and rhythm, S1, S2 normal, no murmur, click, rub or gallop  Abdomen: soft, slightly distended; bowel sounds normal; no masses, no organomegaly  Pulses: 2+ and symmetric Ambulant-cremasteric reflex +   Lab Results:  [facility] 08/05/11 0355 08/04/11 0325  NA 135 134*  K 3.0* 3.1*  CL 99 100  CO2 30 26  GLUCOSE 104* 96  BUN 7 8  CREATININE 1.12 0.95  CALCIUM 8.6 8.5  MG -- --  PHOS -- --    Basename 08/04/11 0325  WBC 8.3  NEUTROABS --  HGB 14.2  HCT 41.6  MCV 92.0  PLT 196    Micro Results:     Pertinent labs and imaging all reviewed today   Medications: I have reviewed the patient's current medications. Scheduled Meds:   . antiseptic oral rinse  15 mL Mouth Rinse BID  . bisacodyl  10 mg Rectal BID  . feeding supplement  237 mL Oral TID WC  . metoprolol tartrate  25 mg Oral BID  . neomycin-bacitracin-polymyxin   Topical TID  . pantoprazole  40 mg Oral Q1200  . phenazopyridine  100 mg Oral TID WC  . polyethylene glycol  17 g Oral Daily  . potassium chloride  40 mEq Oral Daily  . DISCONTD:  potassium chloride  20 mEq Oral Daily   Continuous Infusions:  PRN Meds:.HYDROcodone-acetaminophen, menthol-cetylpyridinium, neomycin-bacitracin-polymyxin, ondansetron, phenazopyridine, phenol, zolpidem   Assessment/Plan:  Bladder cancer s/p resection TURBT 12/6 (07/27/2011) Per urologist-appreciate Dr. Aaron Edelman' expert opinion re: therapy options for what appears to be wide-spread transitional Cell ca  Hyponatremia. This is borderline to very mild-Review labs am   Hypokalemia-replace orally with KDUR 40-could be from diarrhea/Ileus.  Diarrhea:-state he has been having dark "coffee-ground stool"-this was reviewed by me and was actually more  green. Unclear etiology of this diarrhea-CDIFF x 2 neg.  graduated diet today with hope of resolution wean opiates as well as can be responsible for this--could also be related to taking only ensure  Abdominal distention: Abdominal X-Ray of 08/04/11 shows finding consistent with ileus again- consistent with 12/11 AXR, and patient complains of constipation, despite bowel regimen. abdominal Ct unremarkable for acute issue.  FTT: Patient states that he just doesn't feel like eating. I suspect this will improve with resolution of his acute problems.   Depression: Per RN, patient's spouse states that patient has been depressed, even prior to hospitalization. Antidepressant trial as out-patient   Htn-increase metoprolol to 25 bid and reassess   Deconditioning. Continue PT/OT-likely can d/c home if stabilizes.    LOS: 7 days   Latacha Texeira,JAI 08/05/2011, 1:57 PM

## 2011-08-05 NOTE — Progress Notes (Signed)
  Subjective: Patient reports urgency with some urge incontinence. He is not having any difficulty voiding but does have irritative symptoms.  Objective: Vital signs in last 24 hours: Temp:  [97.8 F (36.6 C)-99.4 F (37.4 C)] 97.8 F (36.6 C) (12/15 0504) Pulse Rate:  [73-95] 73  (12/15 0504) Resp:  [17-19] 17  (12/15 0504) BP: (111-138)/(59-70) 135/70 mmHg (12/15 0504) SpO2:  [94 %-97 %] 97 % (12/15 0504)  Intake/Output from previous day: 12/14 0701 - 12/15 0700 In: -  Out: 100 [Urine:100] Intake/Output this shift:    Lab Results:  Basename 08/04/11 0325  HGB 14.2  HCT 41.6   BMET  Basename 08/05/11 0355 08/04/11 0325  NA 135 134*  K 3.0* 3.1*  CL 99 100  CO2 30 26  GLUCOSE 104* 96  BUN 7 8  CREATININE 1.12 0.95  CALCIUM 8.6 8.5   No results found for this basename: LABPT:3,INR:3 in the last 72 hours No results found for this basename: LABURIN:1 in the last 72 hours Results for orders placed during the hospital encounter of 07/29/11  CLOSTRIDIUM DIFFICILE BY PCR     Status: Normal   Collection Time   08/01/11  2:08 PM      Component Value Range Status Comment   C difficile by pcr NEGATIVE  NEGATIVE  Final   URINE CULTURE     Status: Normal   Collection Time   08/01/11  4:41 PM      Component Value Range Status Comment   Specimen Description URINE, CATHETERIZED   Final    Special Requests NONE   Final    Setup Time 201212120109   Final    Colony Count NO GROWTH   Final    Culture NO GROWTH   Final    Report Status 08/02/2011 FINAL   Final   CLOSTRIDIUM DIFFICILE BY PCR     Status: Normal   Collection Time   08/04/11 12:55 PM      Component Value Range Status Comment   C difficile by pcr NEGATIVE  NEGATIVE  Final     Studies/Results: Dg Abd Acute W/chest  08/04/2011  *RADIOLOGY REPORT*  Clinical Data: Nausea and vomiting.  Ridging and distended abdomen.  ACUTE ABDOMEN SERIES (ABDOMEN 2 VIEW & CHEST 1 VIEW)  Comparison: CT abdomen and pelvis  08/01/2011 and two views of the abdomen 08/01/2011.  Findings: Single view of the chest demonstrates a trace amount of pleural fluid on the right.  Lungs are clear.  No pneumothorax. Heart size is normal.  Two views of the abdomen show marked gaseous distention of the colon and in particular the cecum.  The appearance is unchanged compared to scout film from CT abdomen and pelvis 3 days ago.  No free intraperitoneal air is identified.  IMPRESSION:  1.  Trace amount of pleural fluid on the right. 2.  No change in bowel gas pattern most consistent with colonic ileus.  Original Report Authenticated By: Bernadene Bell. Maricela Curet, M.D.    Assessment/Plan: It would appear he is voiding. In fact it sounds like he is having some urgency and urge incontinence. I'm going to make sure that he is not in retention and also place him on some pyridium. If he has a low residual consideration would be given to anticholinergic therapy.  Bladder scan PVR.  Pyridium   LOS: 7 days   Clemmie Buelna C 08/05/2011, 6:55 AM

## 2011-08-06 MED ORDER — POTASSIUM CHLORIDE CRYS ER 20 MEQ PO TBCR
40.0000 meq | EXTENDED_RELEASE_TABLET | Freq: Two times a day (BID) | ORAL | Status: DC
  Administered 2011-08-06 – 2011-08-07 (×2): 40 meq via ORAL
  Filled 2011-08-06 (×3): qty 2

## 2011-08-06 NOTE — Progress Notes (Signed)
[facility] HOSPITALIST progress note    Interval h/o:- 77 year old male, with history of bladder Cancer, s/p large volume transurethral resection of bladder tumor 07/27/11 for clinically localized bladder cancer. This was partially resected with plan for staged resection depending on final pathology.. This was discovered on w/u of irritative voiding symptoms. Patient presented twice to ED 07/28/11-07/29/11, with hematuria with clots, pelvic pain and catheter problems. He was eventually admitted, and per Dr Margarita Grizzle, has stabilized from Urologic view point.  Had post-op ileus 12.11 and then was noted to have diarrhoea-Ct and AXR was neg  Has developed copious stool-reported dark, but noted to be green 12.14-Cdiff neg x 2 tests.  NPO, and incontinent of oth stool and urine-reported this 12.15 and had had multiple episodes of Rectal incontienence-this doesn't appear to be new, and continues to have 3-55 stools per day, which is long standing acc to his wife Blood counts have increased  Urine is returning to yellow Colour-Post-void residuals are low, and urology thinks he might have some incontinence of urine as well-dtarted on darifenacin with some dryness of mouth  Subjective: Febrile to 100.2 today No new issues.  Seems despondent Dryness of mouth-abd pain moderate.  No further urinary issues really.  Treatment Team:  Milford Cage, MD Objective: Vital signs in last 24 hours: Temp:  [98.6 F (37 C)-100.2 F (37.9 C)] 100.2 F (37.9 C) (12/16 1500) Pulse Rate:  [79-106] 81  (12/16 1500) Resp:  [16-20] 16  (12/16 1500) BP: (119-151)/(71-84) 149/84 mmHg (12/16 1500) SpO2:  [93 %-97 %] 97 % (12/16 1500) Weight change:   Intake/Output Summary (Last 24 hours) at 08/06/11 1543 Last data filed at 08/06/11 1500  Gross per 24 hour  Intake    480 ml  Output    206 ml  Net    274 ml    BP 149/84  Pulse 81  Temp(Src) 100.2 F (37.9 C) (Oral)  Resp 16  Ht 5\' 9"  (1.753 m)  Wt 85.6 kg  (188 lb 11.4 oz)  BMI 27.87 kg/m2  SpO2 97% General appearance: alert and cooperative  Throat: lips, mucosa, and tongue normal; teeth and gums normal  Lungs: clear to auscultation bilaterally and normal percussion bilaterally  Heart: regular rate and rhythm, S1, S2 normal, no murmur, click, rub or gallop  Abdomen: soft, slightly distended; bowel sounds normal; no masses, no organomegaly  Pulses: 2+ and symmetric   Lab Results:    Pertinent labs and imaging all reviewed today   Medications: I have reviewed the patient's current medications. Scheduled Meds:   . antiseptic oral rinse  15 mL Mouth Rinse BID  . darifenacin  7.5 mg Oral Daily  . metoprolol tartrate  25 mg Oral BID  . neomycin-bacitracin-polymyxin   Topical TID  . phenazopyridine  100 mg Oral TID WC  . polyethylene glycol  17 g Oral Daily  . potassium chloride  40 mEq Oral Daily   Continuous Infusions:  PRN Meds:.menthol-cetylpyridinium, neomycin-bacitracin-polymyxin, ondansetron, phenazopyridine, phenol, traMADol, zolpidem   Assessment/Plan: Bladder cancer s/p resection TURBT 12/6 (07/27/2011) Per urologist-appreciate Dr. Aaron Edelman' expert opinion re: therapy options for what appears to be wide-spread transitional Cell ca  Out-pt f/u  Urinary ibncontinence-darifenacin-causing a little dry mouth.  Would chang eto qod on d/c home  Hyponatremia. This is borderline to very mild.  Hypokalemia-replace orally with KDUR 40>>60 12.16-could be from diarrhea/Ileus.   Diarrhea:-state he has been having dark "coffee-ground stool"-this was reviewed by me and was actually more green.  Unclear  etiology of this diarrhea-CDIFF x 2 neg. graduated diet today with hope of resolution wean opiates as well as can be responsible for this--could also be related to taking only ensure -change in diet no effect-seems per wif ehas had this at least 6 mo and has been an ognoing struggle between diarrhoea and constipation  Abdominal distention:  Abdominal X-Ray of 08/04/11 shows finding consistent with ileus again- consistent with 12/11 AXR, and patient complains of constipation, despite bowel regimen. abdominal Ct unremarkable for acute issue.   FTT: Patient states that he just doesn't feel like eating. I suspect this will improve with resolution of his acute problems .  Depression: Per RN, patient's spouse states that patient has been depressed, even prior to hospitalization. Antidepressant trial as out-patient   Htn-metoprolol to 25 bid-moderate controlled  Deconditioning. Continue PT/OT-likely can d/c home if stabilizes    LOS: 8 days   [facility] 08/06/2011, 3:43 PM

## 2011-08-06 NOTE — Progress Notes (Signed)
Physical Therapy Treatment Patient Details Name: [name] MRN: [id number] DOB: Nov 09, 1925 Today's Date: 08/06/2011 Time: 1478-2956  1G PT Assessment/Plan  PT - Assessment/Plan Comments on Treatment Session: Pt agreeable to ambulation with PT. Pt reports ambulating in hallway before this session. Feel pt may be able to D/C home with HHPT and 24 hour supervision.  PT Plan: Discharge plan needs to be updated Follow Up Recommendations: Home health PT;24 hour supervision/assistance Equipment Recommended: None recommended by PT PT Goals  Acute Rehab PT Goals PT Goal: Supine/Side to Sit - Progress: Progressing toward goal PT Goal: Sit to Stand - Progress: Progressing toward goal PT Goal: Stand to Sit - Progress: Progressing toward goal PT Goal: Ambulate - Progress: Progressing toward goal  PT Treatment Precautions/Restrictions  Precautions Precautions: Fall Precaution Comments: pt with abdominal problems r/o ileus Restrictions Weight Bearing Restrictions: No Mobility (including Balance) Bed Mobility Bed Mobility: Yes Supine to Sit: HOB flat;5: Supervision Sit to Supine - Right: HOB flat;5: Supervision Transfers Transfers: Yes Sit to Stand: 5: Supervision Sit to Stand Details (indicate cue type and reason): VCs safety, technique, hand placement.  Stand to Sit: 5: Supervision Stand to Sit Details: VCs safety, technique, hand placement Ambulation/Gait Ambulation/Gait Assistance Details (indicate cue type and reason): Min-guard assist. Unsteady with intermittent stumbling.  Ambulation Distance (Feet): 150 Feet Assistive device: None Gait Pattern: Trunk flexed;Step-through pattern    Exercise    End of Session PT - End of Session Equipment Utilized During Treatment: Gait belt Activity Tolerance: Patient tolerated treatment well Patient left: in bed;with call bell in reach;with family/visitor present General Behavior During Session: [facility] for tasks performed  Rebeca Alert  [facility] 08/06/2011, 12:25 PM

## 2011-08-06 NOTE — Progress Notes (Signed)
Patient ID: ALDOUS HOUSEL, male   DOB: 1925-10-09, 77 y.o.   MRN: [id number]  Subjective: Patient reports no further urinary incontinence since beginning anticholinergic therapy on the schedule basis yesterday. He otherwise has no new urologic complaints today.  Objective: Vital signs in last 24 hours: Temp:  [98.6 F (37 C)-99.8 F (37.7 C)] 98.6 F (37 C) (12/16 0436) Pulse Rate:  [79-89] 89  (12/16 0436) Resp:  [17-20] 17  (12/16 0436) BP: (119-144)/(71-79) 131/79 mmHg (12/16 0436) SpO2:  [93 %-95 %] 95 % (12/16 0436)A  Intake/Output from previous day: 12/15 0701 - 12/16 0700 In: 0  Out: 403 [Urine:400; Stool:3] Intake/Output this shift:    Past Medical History  Diagnosis Date  . DIVERTICULOSIS, COLON 10/07/2006    pt. has freq boutw with constipation alternates with diarrhea  . HYPERLIPIDEMIA 10/07/2006  . GERD 10/06/2008  . HYPERTROPHY PROSTATE W/UR OBST & OTH LUTS 06/03/2009  . TORTICOLLIS 10/27/2009  . Memory loss 08/12/2007  . Palpitations 03/22/2010  . ACTION TREMOR 08/12/2007  . Meralgia paraesthetica   . Syncope   . Urosepsis 2007    with syncope  . Prostatitis 2007  . HYPERTENSION 10/07/2006  . Skin cancer   . Microscopic hematuria 07-24-11    no visual blood, last UTI was 10'12,tx. with antibiotic  . Vertigo 07-24-11    x2   . Bladder cancer    Current Facility-Administered Medications  Medication Dose Route Frequency Provider Last Rate Last Dose  . antiseptic oral rinse (BIOTENE) solution 15 mL  15 mL Mouth Rinse BID Theodore Manny   15 mL at 08/05/11 2000  . darifenacin (ENABLEX) 24 hr tablet 7.5 mg  7.5 mg Oral Daily Pleas Koch, MD   7.5 mg at 08/05/11 1736  . menthol-cetylpyridinium (CEPACOL) lozenge 3 mg  1 lozenge Oral PRN Sebastian Ache      . metoprolol tartrate (LOPRESSOR) tablet 25 mg  25 mg Oral BID Pleas Koch, MD   25 mg at 08/05/11 2200  . neomycin-bacitracin-polymyxin (NEOSPORIN) ointment 1 application  1 application Topical TID PRN Sebastian Ache    1 application at 08/02/11 [id number]  . neomycin-bacitracin-polymyxin (NEOSPORIN) ointment   Topical TID Milford Cage, MD      . ondansetron [facility]) injection 4 mg  4 mg Intravenous Q4H PRN Sebastian Ache   4 mg at 08/01/11 0732  . phenazopyridine (PYRIDIUM) tablet 100 mg  100 mg Oral TID WC Garnett Farm, MD   100 mg at 08/05/11 1623  . phenazopyridine (PYRIDIUM) tablet 200 mg  200 mg Oral TID PRN Sebastian Ache   200 mg at 08/03/11 1111  . phenol (CHLORASEPTIC) mouth spray 1 spray  1 spray Mouth/Throat PRN Sebastian Ache      . polyethylene glycol (MIRALAX / GLYCOLAX) packet 17 g  17 g Oral Daily Milford Cage, MD   17 g at 08/01/11 0944  . potassium chloride SA (K-DUR,KLOR-CON) CR tablet 40 mEq  40 mEq Oral Daily Pleas Koch, MD   40 mEq at 08/05/11 0943  . traMADol (ULTRAM) tablet 50 mg  50 mg Oral Q6H PRN Pleas Koch, MD      . zolpidem (AMBIEN) tablet 5 mg  5 mg Oral QHS PRN Sebastian Ache      . DISCONTD: bisacodyl (DULCOLAX) suppository 10 mg  10 mg Rectal BID Milford Cage, MD   10 mg at 08/02/11 0851  . DISCONTD: feeding supplement (ENSURE CLINICAL STRENGTH) liquid 237 mL  237 mL Oral TID WC  Milford Cage, MD   237 mL at 08/04/11 0900  . DISCONTD: HYDROcodone-acetaminophen (NORCO) 5-325 MG per tablet 1-2 tablet  1-2 tablet Oral Q4H PRN Sebastian Ache   1 tablet at 08/04/11 2236  . DISCONTD: pantoprazole (PROTONIX) EC tablet 40 mg  40 mg Oral Q1200 Theodore Manny   40 mg at 08/05/11 1214  . DISCONTD: potassium chloride (K-DUR,KLOR-CON) CR tablet 20 mEq  20 mEq Oral Daily Pleas Koch, MD   20 mEq at 08/04/11 1454   Facility-Administered Medications Ordered in Other Encounters  Medication Dose Route Frequency Provider Last Rate Last Dose  . mitomycin (MUTAMYCIN) chemo injection 40 mg  40 mg Bladder Instillation Once         Physical Exam:  General: Alert, oriented and awake. Lungs - Normal respiratory effort, chest expands symmetrically.  Abdomen - Soft,  non-tender & non-distended.  Lab Results:  [facility] 08/04/11 0325  WBC 8.3  HGB 14.2  HCT 41.6   BMET  Basename 08/05/11 0355 08/04/11 0325  NA 135 134*  K 3.0* 3.1*  CL 99 100  CO2 30 26  GLUCOSE 104* 96  BUN 7 8  CREATININE 1.12 0.95  CALCIUM 8.6 8.5   No results found for this basename: LABPT:3,INR:3 in the last 72 hours No results found for this basename: LABURIN:1 in the last 72 hours Results for orders placed during the hospital encounter of 07/29/11  CLOSTRIDIUM DIFFICILE BY PCR     Status: Normal   Collection Time   08/01/11  2:08 PM      Component Value Range Status Comment   C difficile by pcr NEGATIVE  NEGATIVE  Final   URINE CULTURE     Status: Normal   Collection Time   08/01/11  4:41 PM      Component Value Range Status Comment   Specimen Description URINE, CATHETERIZED   Final    Special Requests NONE   Final    Setup Time [id number]   Final    Colony Count NO GROWTH   Final    Culture NO GROWTH   Final    Report Status 08/02/2011 FINAL   Final   CLOSTRIDIUM DIFFICILE BY PCR     Status: Normal   Collection Time   08/04/11 12:55 PM      Component Value Range Status Comment   C difficile by pcr NEGATIVE  NEGATIVE  Final     Assessment/Plan:  He is doing well status post TURBT. You was having some bladder irritation from the surgery which was resulting in urgency and urge incontinence. He also was having some incontinence of stool. I had discussed with his physician yesterday beginning anticholinergic therapy since I checked a PVR and found it was only 43 cc. Because of that it was felt safe to start anticholinergic therapy and he was started on Enablex 7.5 mg. He reports that since he began the medication he has not had any further urge incontinence. The hope was that this anticholinergic therapy would also help with some of his incontinence of stool.   Vladimir Lenhoff C 08/06/2011, 7:40 AM

## 2011-08-07 MED ORDER — POTASSIUM CHLORIDE CRYS ER 20 MEQ PO TBCR: 40.0000 meq | EXTENDED_RELEASE_TABLET | Freq: Two times a day (BID) | ORAL | Status: DC

## 2011-08-07 MED ORDER — BACITRACIN-NEOMYCIN-POLYMYXIN OINTMENT TUBE: 1.0000 "application " | TOPICAL_OINTMENT | Freq: Three times a day (TID) | CUTANEOUS | Status: DC

## 2011-08-07 MED ORDER — ZOLPIDEM TARTRATE 5 MG PO TABS: 5.0000 mg | ORAL_TABLET | Freq: Every evening | ORAL | Status: DC | PRN

## 2011-08-07 MED ORDER — PHENAZOPYRIDINE HCL 200 MG PO TABS: 200.0000 mg | ORAL_TABLET | Freq: Three times a day (TID) | ORAL | Status: AC | PRN

## 2011-08-07 MED ORDER — TRAMADOL HCL 50 MG PO TABS: 50.0000 mg | ORAL_TABLET | Freq: Four times a day (QID) | ORAL | Status: AC | PRN

## 2011-08-07 MED ORDER — BACITRACIN-NEOMYCIN-POLYMYXIN 400-5-5000 EX OINT: 1.0000 "application " | TOPICAL_OINTMENT | Freq: Three times a day (TID) | CUTANEOUS | Status: AC | PRN

## 2011-08-07 NOTE — Discharge Summary (Signed)
Physician Discharge Summary  Patient ID: [name] MRN: [id number] DOB/AGE: 12/22/1925 77 y.o.  Admit date: 07/29/2011 Discharge date: 08/07/2011  Admission Diagnoses: s/P TURBT 12/6 +Hematuria  Discharge Diagnoses:  Principal Problem:  *Bladder cancer s/p resection 12/6 Active Problems:  HYPERTENSION  Hyponatremia  CIS (carcinoma in situ of bladder)   Discharged Condition: fair  Hospital Course: 77 year old male, with history of bladder Cancer, s/p large volume transurethral resection of bladder tumor 07/27/11 for clinically localized bladder cancer. This was partially resected with plan for staged resection depending on final pathology.. This was discovered on w/u of irritative voiding symptoms. Patient presented twice to ED 07/28/11-07/29/11, with hematuria with clots, pelvic pain and catheter problems. He was eventually admitted 12.8 and per Dr Margarita Grizzle. Medicine was consulted to take over care as he had altered electrolytes and failure to thrive Had post-op ileus 12.11 and then was noted to have diarrhoea-Ct and AXR was neg for work-up Has developed copious stool-reported dark, but noted to be green 12.14-Cdiff neg x 2 tests.  Was kept NPO, and incontinent of stool and urine-reported this 12.15 and had had multiple episodes of Rectal incontienence-this doesn't appear to be new, and continues to have 3-5 stools per day, which is long standing acc to his wife-Had been on ensure. A work-up for Ova/cysts and parasites was ordered but not performed given chronicity of this Patient had initially an Indwelling foley (which was discontinued) due to significant hematuria-this returned to yellow-a trial of darifenacin was done, but patient developed significant dryness of his mouth and didn't;t notice any real difference form this ,and was hence d/c'd He remains pretty despondent and worried about his prognosis-a note from his primary Urologist Dr. Annabell Howells states that he probably might benefit  from BCG instillation, and this can be worked up as an out-patient He had an appt today 12.17 at [facility] office, which I have asked him to re-schedule for about 1 weeks from now  Consults: urology  Significant Diagnostic Studies: labs: Hypokalemic and mild hyponatremia and radiology: KUB: 12.15 Showed persisting Ileus, but in a setting of passage of loose stool and CT scan: Diffuse gaseous distention of colon with multiple colonic air fluid  levels. The distended colon extends from the cecum down to the  rectum with no gross obstructing rectal mass lesion evident. There  is no evidence for colonic wall thickening or pericolonic edema.  Imaging features are felt to likely be related to adynamic ileus.  The patient does have a small amount of free fluid in the anterior  right pelvis and just anterior to the rectum.  Bladder is decompressed by Foley catheter. There is some  irregularity and edema associated with the bladder wall but this  patient is postop day #5 of for cystoscopy and transurethral  resection of large bladder tumor. Bladder wall changes are  presumed to be postoperative.    Treatments: IV hydration and surgery: Cystopscopy & TURBT  Discharge Exam: Blood pressure 129/74, pulse 89, temperature 97.4 F (36.3 C), temperature source Oral, resp. rate 18, height 5\' 9"  (1.753 m), weight 85.6 kg (188 lb 11.4 oz), SpO2 92.00%. General appearance: alert, cooperative and appears older than stated age Throat: lips, mucosa, and tongue normal; teeth and gums normal Resp: clear to auscultation bilaterally and normal percussion bilaterally Cardio: regular rate and rhythm, S1, S2 normal, no murmur, click, rub or gallop GI: soft, non-tender; bowel sounds normal; no masses,  no organomegaly and Penile edema decreased Extremities: extremities normal, atraumatic, no  cyanosis or edema Pulses: 2+ and symmetric  Disposition: Home or Self Care   Follow-up Appointments: Discharge Orders      Future Orders Please Complete By Expires   Diet - low sodium heart healthy      Increase activity slowly      Discharge instructions      Comments:   Notify your Urologist that you have been discharged home.   Call MD for:  temperature >100.4      Call MD for:  persistant nausea and vomiting      Call MD for:  severe uncontrolled pain      Call MD for:  difficulty breathing, headache or visual disturbances      Call MD for:  extreme fatigue      Call MD for:  hives      Call MD for:  persistant dizziness or light-headedness         Discharge Medications: Current Discharge Medication List    START taking these medications   Details  neomycin-bacitracin-polymyxin (NEOSPORIN) OINT Apply 1 application topically 3 (three) times daily. Qty: 1 Tube, Refills: 1    potassium chloride SA (K-DUR,KLOR-CON) 20 MEQ tablet Take 2 tablets (40 mEq total) by mouth 2 (two) times daily. Qty: 60 tablet, Refills: 0    traMADol (ULTRAM) 50 MG tablet Take 1 tablet (50 mg total) by mouth every 6 (six) hours as needed. Maximum dose= 8 tablets per day Qty: 30 tablet, Refills: 0    zolpidem (AMBIEN) 5 MG tablet Take 1 tablet (5 mg total) by mouth at bedtime as needed for sleep. Qty: 30 tablet, Refills: 0    CONTINUE these medications which have CHANGED   Details  phenazopyridine (PYRIDIUM) 200 MG tablet Take 1 tablet (200 mg total) by mouth 3 (three) times daily as needed for pain. Qty: 30 tablet, Refills: 0      CONTINUE these medications which have NOT CHANGED   Details  acetaminophen (TYLENOL) 325 MG tablet Take 650 mg by mouth every 6 (six) hours as needed. For pain    metoprolol tartrate (LOPRESSOR) 25 MG tablet Take 12.5 mg by mouth 2 (two) times daily.     omeprazole (PRILOSEC) 20 MG capsule Take 20 mg by mouth daily.     valsartan (DIOVAN) 320 MG tablet Take 160 mg by mouth daily.        STOP taking these medications     ciprofloxacin (CIPRO) 250 MG tablet      docusate sodium  (COLACE) 100 MG capsule      hyoscyamine (LEVSIN/SL) 0.125 MG SL tablet        To do: Needs bmp 5-7 days, was hypokalemic in the hospital Further discussion re: bladder cancer with Dr. Teryl Lucy Goals of Care meeting to be coordinated.   Chronic diarrhoea + constipation-needs bowel regimen, but unclear etiology-passign stool despite "colonic ileus"--Work-up per primary if felt appropriate,.  SignedPleas Koch 08/07/2011, 9:26 AM

## 2011-11-06 ENCOUNTER — Encounter: Payer: Self-pay | Admitting: Internal Medicine

## 2011-11-06 ENCOUNTER — Ambulatory Visit (INDEPENDENT_AMBULATORY_CARE_PROVIDER_SITE_OTHER): Payer: Medicare Other | Admitting: Internal Medicine

## 2011-11-06 DIAGNOSIS — R7309 Other abnormal glucose: Secondary | ICD-10-CM

## 2011-11-06 DIAGNOSIS — E876 Hypokalemia: Secondary | ICD-10-CM

## 2011-11-06 DIAGNOSIS — R739 Hyperglycemia, unspecified: Secondary | ICD-10-CM

## 2011-11-06 DIAGNOSIS — B379 Candidiasis, unspecified: Secondary | ICD-10-CM

## 2011-11-06 LAB — BASIC METABOLIC PANEL
CO2: 27 mEq/L (ref 19–32)
Chloride: 105 mEq/L (ref 96–112)
Potassium: 5.1 mEq/L (ref 3.5–5.1)
Sodium: 140 mEq/L (ref 135–145)

## 2011-11-06 LAB — HEMOGLOBIN A1C: Hgb A1c MFr Bld: 5.5 % (ref 4.6–6.5)

## 2011-11-06 MED ORDER — KETOCONAZOLE 2 % EX CREA: TOPICAL_CREAM | CUTANEOUS | Status: DC

## 2011-11-06 NOTE — Progress Notes (Signed)
  Subjective:    Patient ID: SABATINO WILLIARD, male    DOB: 1925-12-13, 78 y.o.   MRN: [id number]  HPI His 07/2011 urologic hospital record was reviewed. He's been receiving chemotherapy through a urinary catheter weekly; this is to be continued for a total of 6 weeks in Dr. Jeralyn Ruths office.  His potassium was 3.0 on 08/05/11. He was unable to take oral potassium prescribed. He had mild hyperglycemia during hospitalization.  Since he had to wear a diaper postoperatively; he is developed an inguinal rash which has failed to respond to multiple over-the-counter topical agents. He denies fever or chills; he has had some night sweats    Review of Systems   He denies polyuria, polydipsia, or polyphagia       Objective:   Physical Exam   He appears younger than his stated age; he is in no acute distress  He has no lymphadenopathy about the neck, axilla, or inguinal areas  He has no organomegaly or masses  Classic Candidal dermatitis is present in the inguinal areas        Assessment & Plan:  #1 candidiasis  #2 hyperglycemia  #3 hypokalemia  Plan: See orders and recommendations

## 2011-11-06 NOTE — Patient Instructions (Signed)
.  Share results with Dr Annabell Howells

## 2011-12-04 ENCOUNTER — Other Ambulatory Visit: Payer: Self-pay | Admitting: Internal Medicine

## 2012-01-30 ENCOUNTER — Encounter (HOSPITAL_COMMUNITY): Payer: Self-pay | Admitting: *Deleted

## 2012-01-30 ENCOUNTER — Emergency Department (HOSPITAL_COMMUNITY): Payer: Medicare Other

## 2012-01-30 ENCOUNTER — Telehealth: Payer: Self-pay | Admitting: Internal Medicine

## 2012-01-30 ENCOUNTER — Inpatient Hospital Stay (HOSPITAL_COMMUNITY)
Admission: EM | Admit: 2012-01-30 | Discharge: 2012-02-02 | DRG: 446 | Disposition: A | Discharge status: Home / Self Care | Payer: Medicare Other | Attending: Internal Medicine | Admitting: Internal Medicine

## 2012-01-30 DIAGNOSIS — D126 Benign neoplasm of colon, unspecified: Secondary | ICD-10-CM

## 2012-01-30 DIAGNOSIS — G252 Other specified forms of tremor: Secondary | ICD-10-CM

## 2012-01-30 DIAGNOSIS — K8020 Calculus of gallbladder without cholecystitis without obstruction: Secondary | ICD-10-CM

## 2012-01-30 DIAGNOSIS — K5730 Diverticulosis of large intestine without perforation or abscess without bleeding: Secondary | ICD-10-CM

## 2012-01-30 DIAGNOSIS — R1312 Dysphagia, oropharyngeal phase: Secondary | ICD-10-CM

## 2012-01-30 DIAGNOSIS — K8050 Calculus of bile duct without cholangitis or cholecystitis without obstruction: Secondary | ICD-10-CM

## 2012-01-30 DIAGNOSIS — E871 Hypo-osmolality and hyponatremia: Secondary | ICD-10-CM

## 2012-01-30 DIAGNOSIS — E785 Hyperlipidemia, unspecified: Secondary | ICD-10-CM

## 2012-01-30 DIAGNOSIS — N401 Enlarged prostate with lower urinary tract symptoms: Secondary | ICD-10-CM

## 2012-01-30 DIAGNOSIS — R1013 Epigastric pain: Secondary | ICD-10-CM

## 2012-01-30 DIAGNOSIS — Z9089 Acquired absence of other organs: Secondary | ICD-10-CM

## 2012-01-30 DIAGNOSIS — G250 Essential tremor: Secondary | ICD-10-CM

## 2012-01-30 DIAGNOSIS — I10 Essential (primary) hypertension: Secondary | ICD-10-CM

## 2012-01-30 DIAGNOSIS — C679 Malignant neoplasm of bladder, unspecified: Secondary | ICD-10-CM

## 2012-01-30 DIAGNOSIS — D090 Carcinoma in situ of bladder: Secondary | ICD-10-CM

## 2012-01-30 DIAGNOSIS — K219 Gastro-esophageal reflux disease without esophagitis: Secondary | ICD-10-CM

## 2012-01-30 DIAGNOSIS — H8110 Benign paroxysmal vertigo, unspecified ear: Secondary | ICD-10-CM

## 2012-01-30 DIAGNOSIS — K5710 Diverticulosis of small intestine without perforation or abscess without bleeding: Secondary | ICD-10-CM | POA: Diagnosis present

## 2012-01-30 DIAGNOSIS — I951 Orthostatic hypotension: Secondary | ICD-10-CM

## 2012-01-30 DIAGNOSIS — I6529 Occlusion and stenosis of unspecified carotid artery: Secondary | ICD-10-CM

## 2012-01-30 DIAGNOSIS — R7309 Other abnormal glucose: Secondary | ICD-10-CM

## 2012-01-30 DIAGNOSIS — M436 Torticollis: Secondary | ICD-10-CM

## 2012-01-30 DIAGNOSIS — R002 Palpitations: Secondary | ICD-10-CM

## 2012-01-30 DIAGNOSIS — R112 Nausea with vomiting, unspecified: Secondary | ICD-10-CM

## 2012-01-30 DIAGNOSIS — R609 Edema, unspecified: Secondary | ICD-10-CM

## 2012-01-30 DIAGNOSIS — R413 Other amnesia: Secondary | ICD-10-CM

## 2012-01-30 DIAGNOSIS — D72829 Elevated white blood cell count, unspecified: Secondary | ICD-10-CM | POA: Diagnosis present

## 2012-01-30 DIAGNOSIS — R350 Frequency of micturition: Secondary | ICD-10-CM

## 2012-01-30 LAB — COMPREHENSIVE METABOLIC PANEL
ALT: 124 U/L — ABNORMAL HIGH (ref 0–53)
AST: 171 U/L — ABNORMAL HIGH (ref 0–37)
Alkaline Phosphatase: 324 U/L — ABNORMAL HIGH (ref 39–117)
CO2: 21 mEq/L (ref 19–32)
Chloride: 100 mEq/L (ref 96–112)
GFR calc non Af Amer: 76 mL/min — ABNORMAL LOW (ref >90)
Glucose, Bld: 125 mg/dL — ABNORMAL HIGH (ref 70–99)
Sodium: 133 mEq/L — ABNORMAL LOW (ref 135–145)
Total Bilirubin: 3.1 mg/dL — ABNORMAL HIGH (ref 0.3–1.2)

## 2012-01-30 LAB — URINALYSIS, ROUTINE W REFLEX MICROSCOPIC
Glucose, UA: NEGATIVE mg/dL
Hgb urine dipstick: NEGATIVE
Protein, ur: NEGATIVE mg/dL

## 2012-01-30 LAB — DIFFERENTIAL
Basophils Absolute: 0 10*3/uL (ref 0.0–0.1)
Lymphocytes Relative: 5 % — ABNORMAL LOW (ref 12–46)
Lymphs Abs: 0.6 10*3/uL — ABNORMAL LOW (ref 0.7–4.0)
Neutro Abs: 10.3 10*3/uL — ABNORMAL HIGH (ref 1.7–7.7)

## 2012-01-30 LAB — CBC
HCT: 45.1 % (ref 39.0–52.0)
Platelets: 191 10*3/uL (ref 150–400)
RBC: 4.76 MIL/uL (ref 4.22–5.81)
RDW: 14 % (ref 11.5–15.5)
WBC: 11.5 10*3/uL — ABNORMAL HIGH (ref 4.0–10.5)

## 2012-01-30 LAB — URINE MICROSCOPIC-ADD ON

## 2012-01-30 MED ORDER — METRONIDAZOLE IN NACL 5-0.79 MG/ML-% IV SOLN
500.0000 mg | Freq: Three times a day (TID) | INTRAVENOUS | Status: DC
  Administered 2012-01-30 – 2012-02-02 (×8): 500 mg via INTRAVENOUS
  Filled 2012-01-30 (×9): qty 100

## 2012-01-30 MED ORDER — PANTOPRAZOLE SODIUM 40 MG IV SOLR
40.0000 mg | Freq: Two times a day (BID) | INTRAVENOUS | Status: DC
  Administered 2012-01-30 – 2012-02-02 (×6): 40 mg via INTRAVENOUS
  Filled 2012-01-30 (×7): qty 40

## 2012-01-30 MED ORDER — IOHEXOL 300 MG/ML  SOLN
100.0000 mL | Freq: Once | INTRAMUSCULAR | Status: AC | PRN
  Administered 2012-01-30: 100 mL via INTRAVENOUS

## 2012-01-30 MED ORDER — SODIUM CHLORIDE 0.9 % IV SOLN
1.0000 g | Freq: Once | INTRAVENOUS | Status: AC
  Administered 2012-01-30: 1 g via INTRAVENOUS
  Filled 2012-01-30: qty 1

## 2012-01-30 MED ORDER — OLMESARTAN MEDOXOMIL 20 MG PO TABS
20.0000 mg | ORAL_TABLET | Freq: Every day | ORAL | Status: DC
  Administered 2012-01-31 – 2012-02-02 (×3): 20 mg via ORAL
  Filled 2012-01-30 (×3): qty 1

## 2012-01-30 MED ORDER — SODIUM CHLORIDE 0.9 % IV SOLN
INTRAVENOUS | Status: DC
  Administered 2012-01-30 – 2012-02-02 (×4): via INTRAVENOUS

## 2012-01-30 MED ORDER — ONDANSETRON HCL 4 MG/2ML IJ SOLN
4.0000 mg | Freq: Once | INTRAMUSCULAR | Status: AC
  Administered 2012-01-30: 4 mg via INTRAVENOUS
  Filled 2012-01-30: qty 2

## 2012-01-30 MED ORDER — HYDROMORPHONE HCL PF 1 MG/ML IJ SOLN
0.5000 mg | INTRAMUSCULAR | Status: DC | PRN
  Administered 2012-01-30: 0.5 mg via INTRAVENOUS
  Administered 2012-02-02: 01:00:00 via INTRAVENOUS
  Filled 2012-01-30: qty 1

## 2012-01-30 MED ORDER — CIPROFLOXACIN IN D5W 400 MG/200ML IV SOLN
400.0000 mg | Freq: Two times a day (BID) | INTRAVENOUS | Status: DC
  Administered 2012-01-31 – 2012-02-02 (×5): 400 mg via INTRAVENOUS
  Filled 2012-01-30 (×5): qty 200

## 2012-01-30 MED ORDER — MORPHINE SULFATE 2 MG/ML IJ SOLN
2.0000 mg | Freq: Once | INTRAMUSCULAR | Status: AC
  Administered 2012-01-30: 2 mg via INTRAVENOUS
  Filled 2012-01-30: qty 1

## 2012-01-30 MED ORDER — HYDROMORPHONE HCL PF 1 MG/ML IJ SOLN
INTRAMUSCULAR | Status: AC
  Filled 2012-01-30: qty 1

## 2012-01-30 MED ORDER — METOPROLOL TARTRATE 12.5 MG HALF TABLET
12.5000 mg | ORAL_TABLET | Freq: Two times a day (BID) | ORAL | Status: DC
  Administered 2012-01-30 – 2012-02-02 (×5): 12.5 mg via ORAL
  Filled 2012-01-30 (×7): qty 1

## 2012-01-30 MED ORDER — CIPROFLOXACIN IN D5W 400 MG/200ML IV SOLN
400.0000 mg | Freq: Once | INTRAVENOUS | Status: AC
  Administered 2012-01-30: 400 mg via INTRAVENOUS
  Filled 2012-01-30: qty 200

## 2012-01-30 NOTE — ED Provider Notes (Signed)
History     CSN: [id number]  Arrival date & time 01/30/12  1038   First MD Initiated Contact with Patient 01/30/12 1042      Chief Complaint  Patient presents with  . Abdominal Pain    (Consider location/radiation/quality/duration/timing/severity/associated sxs/prior treatment) HPI Comments: [name] is a 78 y.o. Male who awoke with upper abdominal pain this morning. The pain was severe initially and then has improved to moderate. He was able to drink a lot of water, today without vomiting. He had a small amount of hard stool after straining to have a bowel movement  this morning. He has been constipated for several weeks. He has similar episode of pain several weeks ago, but did not seek care that time. He denies recent fever, chills, nausea, vomiting, weakness, dizziness, or back pain. He has been urinating well, without dysuria, or hematuria. He did not take any medication for the problem today. There are no exacerbating factors. He is not on chronic stool softeners.  Patient is a 78 y.o. male presenting with abdominal pain. The history is provided by the patient.  Abdominal Pain The primary symptoms of the illness include abdominal pain.    Past Medical History  Diagnosis Date  . DIVERTICULOSIS, COLON 10/07/2006    pt. has freq boutw with constipation alternates with diarrhea  . HYPERLIPIDEMIA 10/07/2006  . GERD 10/06/2008  . HYPERTROPHY PROSTATE W/UR OBST & OTH LUTS 06/03/2009  . TORTICOLLIS 10/27/2009  . Memory loss 08/12/2007  . Palpitations 03/22/2010  . ACTION TREMOR 08/12/2007  . Meralgia paraesthetica   . Syncope   . Urosepsis 2007    with syncope  . Prostatitis 2007  . HYPERTENSION 10/07/2006  . Skin cancer   . Microscopic hematuria 07-24-11    no visual blood, last UTI was 10'12,tx. with antibiotic  . Vertigo 07-24-11    x2   . Bladder cancer     Dr Wilson Singer; Chemotherapy instilled through catheter weekly X 6 weeks in his office    Past Surgical History    Procedure Date  . Tonsillectomy   . Colonoscopy      Diverticulosis  . Cholecystectomy 07-24-11    approx. 4 yrs ago(attacks)  . Cataract extraction, bilateral 07-24-11    bilateral lens implants  . Transurethral resection of bladder tumor 07/27/2011    Procedure: TRANSURETHRAL RESECTION OF BLADDER TUMOR (TURBT);  Surgeon: Anner Crete;  Location: [facility];  Service: Urology;  Laterality: N/A;  Cystoscopy/Transurethral Resection of Bladder Tumor  . Urinary retention     post Urologic surgery    Family History  Problem Relation Age of Onset  . Heart attack Father 31  . Colon cancer Mother 70  . Hypertension Mother   . Cancer Mother     Colon Cancer  . Breast cancer Maternal Grandmother   . Cancer Maternal Grandmother     Breast Cancer  . Heart attack Maternal Grandfather     late 67s  . Heart failure Sister   . Heart disease Sister     CHF    History  Substance Use Topics  . Smoking status: Former Smoker -- 2 years    Quit date: 08/21/1973  . Smokeless tobacco: Never Used  . Alcohol Use: Yes     occasional use      Review of Systems  Gastrointestinal: Positive for abdominal pain.  All other systems reviewed and are negative.    Allergies  Ramipril; Tamsulosin; and Cephalexin  Home Medications   Current  Outpatient Rx  Name Route Sig Dispense Refill  . ACETAMINOPHEN 325 MG PO TABS Oral Take 650 mg by mouth every 6 (six) hours as needed. For pain    . METOPROLOL TARTRATE 50 MG PO TABS  TAKE ONE-HALF TABLET BY MOUTH TWICE DAILY 90 tablet 1  . OMEPRAZOLE 20 MG PO CPDR Oral Take 20 mg by mouth daily.    Marland Kitchen VALSARTAN 320 MG PO TABS Oral Take 160 mg by mouth daily.        BP 107/53  Pulse 86  Temp(Src) 98.7 F (37.1 C) (Oral)  Resp 17  SpO2 95%  Physical Exam  Nursing note and vitals reviewed. Constitutional: He is oriented to person, place, and time. He appears well-developed and well-nourished.  HENT:  Head: Normocephalic and atraumatic.  Right Ear:  External ear normal.  Left Ear: External ear normal.  Eyes: Conjunctivae and EOM are normal. Pupils are equal, round, and reactive to light.  Neck: Normal range of motion and phonation normal. Neck supple.  Cardiovascular: Normal rate, regular rhythm, normal heart sounds and intact distal pulses.   Pulmonary/Chest: Effort normal and breath sounds normal. He exhibits no bony tenderness.  Abdominal: Soft. Normal appearance and bowel sounds are normal. He exhibits no mass. There is tenderness (Mild epigastric tenderness). There is no rebound and no guarding.  Musculoskeletal: Normal range of motion.  Neurological: He is alert and oriented to person, place, and time. He has normal strength. No cranial nerve deficit or sensory deficit. He exhibits normal muscle tone. Coordination normal.  Skin: Skin is warm, dry and intact.  Psychiatric: He has a normal mood and affect. His behavior is normal.    ED Course  Procedures (including critical care time)  Labs Reviewed  CBC - Abnormal; Notable for the following:    WBC 11.5 (*)    All other components within normal limits  DIFFERENTIAL - Abnormal; Notable for the following:    Neutrophils Relative 90 (*)    Neutro Abs 10.3 (*)    Lymphocytes Relative 5 (*)    Lymphs Abs 0.6 (*)    All other components within normal limits  COMPREHENSIVE METABOLIC PANEL - Abnormal; Notable for the following:    Sodium 133 (*)    Glucose, Bld 125 (*)    Albumin 3.3 (*)    AST 171 (*)    ALT 124 (*)    Alkaline Phosphatase 324 (*)    Total Bilirubin 3.1 (*)    GFR calc non Af Amer 76 (*)    GFR calc Af Amer 88 (*)    All other components within normal limits  LIPASE, BLOOD - Abnormal; Notable for the following:    Lipase 263 (*)    All other components within normal limits  URINALYSIS, ROUTINE W REFLEX MICROSCOPIC - Abnormal; Notable for the following:    Color, Urine ORANGE (*) BIOCHEMICALS MAY BE AFFECTED BY COLOR   Bilirubin Urine MODERATE (*)     Ketones, ur TRACE (*)    Urobilinogen, UA 2.0 (*)    Leukocytes, UA SMALL (*)    All other components within normal limits  URINE MICROSCOPIC-ADD ON - Abnormal; Notable for the following:    Squamous Epithelial / LPF FEW (*)    Bacteria, UA FEW (*)    All other components within normal limits  URINE CULTURE   Ct Abdomen Pelvis W Contrast  01/30/2012  *RADIOLOGY REPORT*  Clinical Data: Abdominal pain, onset today.  History of cholecystectomy.  CT ABDOMEN  AND PELVIS WITH CONTRAST  Technique:  Multidetector CT imaging of the abdomen and pelvis was performed following the standard protocol during bolus administration of intravenous contrast.  Contrast: OMNIPAQUE IOHEXOL 300 MG/ML  SOLN  Comparison: Acute abdominal series 01/30/2012.  Abdominal pelvic CT 08/01/2011.  Findings: There is stable scarring in both lung bases.  There is new mild biliary dilatation status post cholecystectomy.  The common bile duct measures up to 10 mm in diameter.  Most apparent on the reformatted images is a probable faintly calcified approximately 8 mm stone in the distal common bile duct (best seen on coronal image 47 and sagittal image 48). There is mild surrounding soft tissue swelling within the porta hepatis.  There is no evidence of pancreatic mass or pancreatic ductal dilatation.  There is a diverticulum of the second portion of the duodenum.  There are mildly prominent lymph nodes within the porta hepatis, likely reactive.  A vague area of increased density within the medial segment of the left hepatic lobe on image 13 is stable.  No new focal lesions are identified.  There is mild atrophy within the left hepatic lobe. The spleen and adrenal glands appear normal.  There are stable bilateral renal cysts.  No enhancing renal masses are identified.  There is no hydronephrosis.  The bowel gas pattern is normal.  There are chronic diverticular changes of the sigmoid colon without surrounding inflammation. There is stable  mild aneurysmal dilatation of the abdominal aorta. 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  Foley catheter has been removed.  There is mild chronic bladder wall thickening, likely secondary to BPH.  Prominent fat within both inguinal canals is stable.  There are stable degenerative changes of the spine associated with a scoliosis.  There are stable changes of chronic osteonecrosis involving the left femoral head. There is no subchondral collapse.  IMPRESSION:  1.  Choledocholithiasis with mild biliary dilatation and surrounding soft tissue stranding.  Superimposed early cholangitis cannot be excluded.  2.  Probable reactive nodes within the porta hepatis. 3.  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  Original Report Authenticated By: Gerrianne Scale, M.D.   Dg Abd Acute W/chest  01/30/2012  *RADIOLOGY REPORT*  Clinical Data: Shortness of breath and constipation.  ACUTE ABDOMEN SERIES (ABDOMEN 2 VIEW & CHEST 1 VIEW)  Comparison: Acute abdominal series 08/04/2011 and abdominal radiograph 08/05/2011.  CT abdomen pelvis 08/01/2011.  Findings: Heart, mediastinal, hilar contours within normal limits. Minimal left basilar atelectasis.  Otherwise, the lungs are clear. No free intraperitoneal air.  Gas is seen scattered throughout the colon, and within some upper quadrant and small bowel loops.  A small bowel loops measure up to 2.2 cm in caliber, within normal limits.  Rectal gas is seen. There is some scattered stool in the proximal colon.  No significant stool in the distal colon or rectum. No acute bony abnormality identified.  IMPRESSION: Nonobstructive bowel gas pattern.  No significant stool burden identified.  Original Report Authenticated By: Britta Mccreedy, M.D.     No diagnosis found.    MDM  Abdominal pain with transaminitis. Pt also has hx of constipation. CT ordered to evaluate for  intra-abdominal infections; e.g. Colitis , hepatitis, and gall bladder disease.   Care to Dr Hyman Hopes to evaluate after CT returns.      Flint Melter, MD 01/30/12 [phone]

## 2012-01-30 NOTE — ED Provider Notes (Signed)
Pt signed out from Dr. Effie Shy. Found to have choledocholithiasis, possible early cholangitis. Pt appears well at this time. Ertapenem. D/W GI who will eval as inpatient for ERCP. Admitted to triad hospitalist.  Forbes Cellar, MD 01/31/12 2038

## 2012-01-30 NOTE — ED Notes (Signed)
Pt from home by ems, abdominal pain onset this morning, started in lower abdominal up to rib cage and radiates to the back. Pt stable, ABC intact.

## 2012-01-30 NOTE — H&P (Addendum)
[name] is an 78 y.o. male.    Cc: Dr. Alwyn Ren (pcp) Chief Complaint: abdominal pain HPI: 78 yo male with hx of ccy circa 2008, presents with c/o abdominal epigastric, sharp, and achy,  Constant since awaking this am.  He has not been eating today. Slight chills, denies fever, n/v, diarrhea, brbpr, black stool.  He does note slight constipation.    In ED, lft abnormal and found to have choledocholithiasis on CT scan and also lipase elevation.   Pt will be admitted for choledocolithiasis, ? Ascending cholangitis and pancreatitis.     Past Medical History  Diagnosis Date  . DIVERTICULOSIS, COLON 10/07/2006    pt. has freq boutw with constipation alternates with diarrhea  . HYPERLIPIDEMIA 10/07/2006  . GERD 10/06/2008  . HYPERTROPHY PROSTATE W/UR OBST & OTH LUTS 06/03/2009  . TORTICOLLIS 10/27/2009  . Memory loss 08/12/2007  . Palpitations 03/22/2010  . ACTION TREMOR 08/12/2007  . Meralgia paraesthetica   . Syncope   . Urosepsis 2007    with syncope  . Prostatitis 2007  . HYPERTENSION 10/07/2006  . Skin cancer   . Microscopic hematuria 07-24-11    no visual blood, last UTI was 10'12,tx. with antibiotic  . Vertigo 07-24-11    x2   . Bladder cancer     Dr Wilson Singer; Chemotherapy instilled through catheter weekly X 6 weeks in his office    Past Surgical History  Procedure Date  . Tonsillectomy   . Colonoscopy      Diverticulosis  . Cholecystectomy 2008    approx. 4 yrs ago(attacks)  . Cataract extraction, bilateral     bilateral lens implants  . Transurethral resection of bladder tumor 07/27/2011    Procedure: TRANSURETHRAL RESECTION OF BLADDER TUMOR (TURBT);  Surgeon: Anner Crete;  Location: [facility];  Service: Urology;  Laterality: N/A;  Cystoscopy/Transurethral Resection of Bladder Tumor  . Urinary retention     post Urologic surgery    Family History  Problem Relation Age of Onset  . Heart attack Father 61  . Colon cancer Mother 14  . Hypertension Mother   . Cancer Mother       Colon Cancer  . Breast cancer Maternal Grandmother   . Cancer Maternal Grandmother     Breast Cancer  . Heart attack Maternal Grandfather     late 73s  . Heart failure Sister   . Heart disease Sister     CHF   Social History:  reports that he quit smoking about 38 years ago. He has never used smokeless tobacco. He reports that he drinks alcohol. He reports that he does not use illicit drugs.  Allergies:  Allergies  Allergen Reactions  . Ramipril Other (See Comments)    Pt doesn't remember if he is allergic to.   . Tamsulosin Other (See Comments)    Unknown   . Cephalexin Other (See Comments)    Bloody loose stools     (Not in a hospital admission)  Results for orders placed during the hospital encounter of 01/30/12 (from the past 48 hour(s))  CBC     Status: Abnormal   Collection Time   01/30/12 11:15 AM      Component Value Range Comment   WBC 11.5 (*) 4.0 - 10.5 (K/uL)    RBC 4.76  4.22 - 5.81 (MIL/uL)    Hemoglobin 15.6  13.0 - 17.0 (g/dL)    HCT 16.1  09.6 - 04.5 (%)    MCV 94.7  78.0 -  100.0 (fL)    MCH 32.8  26.0 - 34.0 (pg)    MCHC 34.6  30.0 - 36.0 (g/dL)    RDW 40.9  81.1 - 91.4 (%)    Platelets 191  150 - 400 (K/uL)   DIFFERENTIAL     Status: Abnormal   Collection Time   01/30/12 11:15 AM      Component Value Range Comment   Neutrophils Relative 90 (*) 43 - 77 (%)    Neutro Abs 10.3 (*) 1.7 - 7.7 (K/uL)    Lymphocytes Relative 5 (*) 12 - 46 (%)    Lymphs Abs 0.6 (*) 0.7 - 4.0 (K/uL)    Monocytes Relative 5  3 - 12 (%)    Monocytes Absolute 0.6  0.1 - 1.0 (K/uL)    Eosinophils Relative 0  0 - 5 (%)    Eosinophils Absolute 0.0  0.0 - 0.7 (K/uL)    Basophils Relative 0  0 - 1 (%)    Basophils Absolute 0.0  0.0 - 0.1 (K/uL)   COMPREHENSIVE METABOLIC PANEL     Status: Abnormal   Collection Time   01/30/12 11:15 AM      Component Value Range Comment   Sodium 133 (*) 135 - 145 (mEq/L)    Potassium 3.9  3.5 - 5.1 (mEq/L)    Chloride 100  96 - 112 (mEq/L)     CO2 21  19 - 32 (mEq/L)    Glucose, Bld 125 (*) 70 - 99 (mg/dL)    BUN 9  6 - 23 (mg/dL)    Creatinine, Ser 7.82  0.50 - 1.35 (mg/dL)    Calcium 9.0  8.4 - 10.5 (mg/dL)    Total Protein 6.6  6.0 - 8.3 (g/dL)    Albumin 3.3 (*) 3.5 - 5.2 (g/dL)    AST 956 (*) 0 - 37 (U/L)    ALT 124 (*) 0 - 53 (U/L)    Alkaline Phosphatase 324 (*) 39 - 117 (U/L)    Total Bilirubin 3.1 (*) 0.3 - 1.2 (mg/dL)    GFR calc non Af Amer 76 (*) >90 (mL/min)    GFR calc Af Amer 88 (*) >90 (mL/min)   LIPASE, BLOOD     Status: Abnormal   Collection Time   01/30/12 11:15 AM      Component Value Range Comment   Lipase 263 (*) 11 - 59 (U/L)   URINALYSIS, ROUTINE W REFLEX MICROSCOPIC     Status: Abnormal   Collection Time   01/30/12 11:56 AM      Component Value Range Comment   Color, Urine ORANGE (*) YELLOW  BIOCHEMICALS MAY BE AFFECTED BY COLOR   APPearance CLEAR  CLEAR     Specific Gravity, Urine 1.022  1.005 - 1.030     pH 7.0  5.0 - 8.0     Glucose, UA NEGATIVE  NEGATIVE (mg/dL)    Hgb urine dipstick NEGATIVE  NEGATIVE     Bilirubin Urine MODERATE (*) NEGATIVE     Ketones, ur TRACE (*) NEGATIVE (mg/dL)    Protein, ur NEGATIVE  NEGATIVE (mg/dL)    Urobilinogen, UA 2.0 (*) 0.0 - 1.0 (mg/dL)    Nitrite NEGATIVE  NEGATIVE     Leukocytes, UA SMALL (*) NEGATIVE    URINE MICROSCOPIC-ADD ON     Status: Abnormal   Collection Time   01/30/12 11:56 AM      Component Value Range Comment   Squamous Epithelial / LPF FEW (*) RARE  WBC, UA 3-6  <3 (WBC/hpf)    Bacteria, UA FEW (*) RARE     Urine-Other MUCOUS PRESENT      Ct Abdomen Pelvis W Contrast  01/30/2012  *RADIOLOGY REPORT*  Clinical Data: Abdominal pain, onset today.  History of cholecystectomy.  CT ABDOMEN AND PELVIS WITH CONTRAST  Technique:  Multidetector CT imaging of the abdomen and pelvis was performed following the standard protocol during bolus administration of intravenous contrast.  Contrast: OMNIPAQUE IOHEXOL 300 MG/ML  SOLN  Comparison:  Acute abdominal series 01/30/2012.  Abdominal pelvic CT 08/01/2011.  Findings: There is stable scarring in both lung bases.  There is new mild biliary dilatation status post cholecystectomy.  The common bile duct measures up to 10 mm in diameter.  Most apparent on the reformatted images is a probable faintly calcified approximately 8 mm stone in the distal common bile duct (best seen on coronal image 47 and sagittal image 48). There is mild surrounding soft tissue swelling within the porta hepatis.  There is no evidence of pancreatic mass or pancreatic ductal dilatation.  There is a diverticulum of the second portion of the duodenum.  There are mildly prominent lymph nodes within the porta hepatis, likely reactive.  A vague area of increased density within the medial segment of the left hepatic lobe on image 13 is stable.  No new focal lesions are identified.  There is mild atrophy within the left hepatic lobe. The spleen and adrenal glands appear normal.  There are stable bilateral renal cysts.  No enhancing renal masses are identified.  There is no hydronephrosis.  The bowel gas pattern is normal.  There are chronic diverticular changes of the sigmoid colon without surrounding inflammation. There is stable mild aneurysmal dilatation of the abdominal aorta. 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  Foley catheter has been removed.  There is mild chronic bladder wall thickening, likely secondary to BPH.  Prominent fat within both inguinal canals is stable.  There are stable degenerative changes of the spine associated with a scoliosis.  There are stable changes of chronic osteonecrosis involving the left femoral head. There is no subchondral collapse.  IMPRESSION:  1.  Choledocholithiasis with mild biliary dilatation and surrounding soft tissue stranding.  Superimposed early cholangitis cannot be excluded.  2.  Probable reactive nodes within the  porta hepatis. 3.  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  Original Report Authenticated By: Gerrianne Scale, M.D.   Dg Abd Acute W/chest  01/30/2012  *RADIOLOGY REPORT*  Clinical Data: Shortness of breath and constipation.  ACUTE ABDOMEN SERIES (ABDOMEN 2 VIEW & CHEST 1 VIEW)  Comparison: Acute abdominal series 08/04/2011 and abdominal radiograph 08/05/2011.  CT abdomen pelvis 08/01/2011.  Findings: Heart, mediastinal, hilar contours within normal limits. Minimal left basilar atelectasis.  Otherwise, the lungs are clear. No free intraperitoneal air.  Gas is seen scattered throughout the colon, and within some upper quadrant and small bowel loops.  A small bowel loops measure up to 2.2 cm in caliber, within normal limits.  Rectal gas is seen. There is some scattered stool in the proximal colon.  No significant stool in the distal colon or rectum. No acute bony abnormality identified.  IMPRESSION: Nonobstructive bowel gas pattern.  No significant stool burden identified.  Original Report Authenticated By: Britta Mccreedy, M.D.    Review of Systems  Constitutional: Positive for chills. Negative  for fever, weight loss and malaise/fatigue.  HENT: Negative for hearing loss, ear pain, nosebleeds, congestion, neck pain, tinnitus and ear discharge.   Eyes: Negative for blurred vision, double vision, photophobia, pain, discharge and redness.  Respiratory: Negative for cough, hemoptysis, sputum production and shortness of breath.   Cardiovascular: Positive for claudication. Negative for chest pain, palpitations, orthopnea and leg swelling.  Gastrointestinal: Positive for abdominal pain. Negative for heartburn, nausea, vomiting, diarrhea, constipation, blood in stool and melena.  Genitourinary: Negative for dysuria, urgency, frequency, hematuria and flank pain.  Musculoskeletal: Negative for myalgias, back pain and joint pain.  Skin: Negative for itching and rash.    Neurological: Positive for tremors. Negative for dizziness, tingling, sensory change, speech change, focal weakness, seizures, loss of consciousness, weakness and headaches.  Endo/Heme/Allergies: Negative for environmental allergies. Does not bruise/bleed easily.  Psychiatric/Behavioral: Negative for depression, suicidal ideas, hallucinations and substance abuse. The patient is not nervous/anxious.     Blood pressure 107/53, pulse 86, temperature 98.7 F (37.1 C), temperature source Oral, resp. rate 17, SpO2 95.00%. Physical Exam  Constitutional: He is oriented to person, place, and time. He appears well-developed and well-nourished. No distress.  HENT:  Head: Normocephalic and atraumatic.  Mouth/Throat: No oropharyngeal exudate.  Eyes: Conjunctivae are normal. Pupils are equal, round, and reactive to light. Right eye exhibits no discharge. Left eye exhibits no discharge. No scleral icterus.  Neck: Normal range of motion. Neck supple. No JVD present. No tracheal deviation present. No thyromegaly present.  Cardiovascular: Normal rate, regular rhythm and normal heart sounds.  Exam reveals no gallop and no friction rub.   No murmur heard. Respiratory: Effort normal and breath sounds normal. No stridor. No respiratory distress. He has no wheezes. He has no rales. He exhibits no tenderness.  GI: Soft. Bowel sounds are normal. He exhibits no distension and no mass. There is no tenderness. There is no rebound and no guarding.  Musculoskeletal: Normal range of motion. He exhibits no edema and no tenderness.  Lymphadenopathy:    He has no cervical adenopathy.  Neurological: He is alert and oriented to person, place, and time. He has normal reflexes. He displays normal reflexes. No cranial nerve deficit. He exhibits normal muscle tone. Coordination normal.  Skin: Skin is warm and dry. No rash noted. He is not diaphoretic. No erythema. No pallor.  Psychiatric: He has a normal mood and affect. His  behavior is normal. Judgment and thought content normal.   slight palmar erythema,  Negative murphy's sign (note had morphine)  Assessment/Plan Abdominal pain secondary to choledocholithiasis and ? Ascending cholangitis and pancreatitis NPO NSiv Pain control GI consult to evaluate for ? ERCP Cipro and Flagyl iv Protonix iv  Hypertension: cont diovan, and metoprolol  Gerd: d/c omeprazole=>start on protonix  Tremor; likely essential:  Continue to monitor  Pearson Grippe 01/30/2012, 6:55 PM

## 2012-01-30 NOTE — ED Notes (Signed)
ZOX:[id number]<AV> Expected date:<BR> Expected time:<BR> Means of arrival:<BR> Comments:<BR> 86 yom abd pain, to ribs

## 2012-01-30 NOTE — Telephone Encounter (Signed)
  Caller: Gwyn/Spouse; PCP: Marga Melnick; CB#: [phone];  Call regarding Abd Pain.  Pt had [facility] CA 12/12 and all cleared.  This AM pt is having abd pain that goes to his back and up under his ribcage.  Pain is constant.  Triaged Abd Pain and  having pain in the center of his chest that goes thru to his back.  Pt sounds like he is in pain.  Inst to call 911 adn go to the E/R.

## 2012-01-31 DIAGNOSIS — I10 Essential (primary) hypertension: Secondary | ICD-10-CM

## 2012-01-31 DIAGNOSIS — G250 Essential tremor: Secondary | ICD-10-CM

## 2012-01-31 DIAGNOSIS — K8020 Calculus of gallbladder without cholecystitis without obstruction: Secondary | ICD-10-CM

## 2012-01-31 DIAGNOSIS — R1013 Epigastric pain: Secondary | ICD-10-CM

## 2012-01-31 DIAGNOSIS — R112 Nausea with vomiting, unspecified: Secondary | ICD-10-CM

## 2012-01-31 DIAGNOSIS — G252 Other specified forms of tremor: Secondary | ICD-10-CM

## 2012-01-31 DIAGNOSIS — K219 Gastro-esophageal reflux disease without esophagitis: Secondary | ICD-10-CM

## 2012-01-31 LAB — CBC
MCHC: 33.4 g/dL (ref 30.0–36.0)
Platelets: 212 10*3/uL (ref 150–400)
RDW: 14.1 % (ref 11.5–15.5)
WBC: 8.7 10*3/uL (ref 4.0–10.5)

## 2012-01-31 LAB — COMPREHENSIVE METABOLIC PANEL
Alkaline Phosphatase: 258 U/L — ABNORMAL HIGH (ref 39–117)
BUN: 8 mg/dL (ref 6–23)
CO2: 25 mEq/L (ref 19–32)
Chloride: 102 mEq/L (ref 96–112)
GFR calc Af Amer: 66 mL/min — ABNORMAL LOW (ref >90)
GFR calc non Af Amer: 57 mL/min — ABNORMAL LOW (ref >90)
Glucose, Bld: 93 mg/dL (ref 70–99)
Potassium: 3.8 mEq/L (ref 3.5–5.1)
Total Bilirubin: 6.2 mg/dL — ABNORMAL HIGH (ref 0.3–1.2)

## 2012-01-31 LAB — DIFFERENTIAL
Basophils Absolute: 0 10*3/uL (ref 0.0–0.1)
Basophils Relative: 1 % (ref 0–1)
Eosinophils Relative: 3 % (ref 0–5)
Lymphocytes Relative: 20 % (ref 12–46)
Neutro Abs: 6.2 10*3/uL (ref 1.7–7.7)

## 2012-01-31 LAB — PROTIME-INR: INR: 1.05 (ref 0.00–1.49)

## 2012-01-31 LAB — APTT: aPTT: 32 seconds (ref 24–37)

## 2012-01-31 LAB — LIPASE, BLOOD: Lipase: 42 U/L (ref 11–59)

## 2012-01-31 LAB — LIPID PANEL
HDL: 36 mg/dL — ABNORMAL LOW (ref >39)
LDL Cholesterol: 90 mg/dL (ref 0–99)
VLDL: 15 mg/dL (ref 0–40)

## 2012-01-31 NOTE — Progress Notes (Signed)
Subjective: Nausea and vomiting has resolved No fever No chills Wife at bedside  Objective: Vital signs in last 24 hours: Filed Vitals:   01/30/12 1733 01/30/12 2014 01/30/12 2039 01/31/12 0615  BP: 107/53 127/62 131/68 117/72  Pulse: 86 86 87 72  Temp: 98.7 F (37.1 C) 99.9 F (37.7 C) 99.5 F (37.5 C) 98.3 F (36.8 C)  TempSrc: Oral Oral Oral Oral  Resp: 17 14 16 18  Height:   5' 9" (1.753 m)   Weight:   84.4 kg (186 lb 1.1 oz) 86.6 kg (190 lb 14.7 oz)  SpO2: 95% 94% 93% 92%   Weight change:   Intake/Output Summary (Last 24 hours) at 01/31/12 0902 Last data filed at 01/31/12 0600  Gross per 24 hour  Intake   1560 ml  Output   1250 ml  Net    310 ml    Physical Exam: General: Awake, Oriented, No acute distress. HEENT: EOMI. Neck: Supple CV: S1 and S2, rrr Lungs: Clear to ascultation bilaterally, no wheezing Abdomen: Soft, Nontender, Nondistended, +bowel sounds. Ext: Good pulses. Trace edema.   Lab Results:  Basename 01/31/12 0510 01/30/12 1115  NA 135 133*  K 3.8 3.9  CL 102 100  CO2 25 21  GLUCOSE 93 125*  BUN 8 9  CREATININE 1.15 0.91  CALCIUM 8.3* 9.0  MG -- --  PHOS -- --    Basename 01/31/12 0510 01/30/12 1115  AST 123* 171*  ALT 107* 124*  ALKPHOS 258* 324*  BILITOT 6.2* 3.1*  PROT 5.5* 6.6  ALBUMIN 2.7* 3.3*    Basename 01/31/12 0510 01/30/12 1115  LIPASE 42 263*  AMYLASE -- --    Basename 01/31/12 0510 01/30/12 1115  WBC 8.7 11.5*  NEUTROABS 6.2 10.3*  HGB 13.6 15.6  HCT 40.7 45.1  MCV 95.8 94.7  PLT 212 191   No results found for this basename: CKTOTAL:3,CKMB:3,CKMBINDEX:3,TROPONINI:3 in the last 72 hours No components found with this basename: POCBNP:3 No results found for this basename: DDIMER:2 in the last 72 hours No results found for this basename: HGBA1C:2 in the last 72 hours  Basename 01/31/12 0510  CHOL 141  HDL 36*  LDLCALC 90  TRIG 75  CHOLHDL 3.9  LDLDIRECT --   No results found for this basename:  TSH,T4TOTAL,FREET3,T3FREE,THYROIDAB in the last 72 hours No results found for this basename: VITAMINB12:2,FOLATE:2,FERRITIN:2,TIBC:2,IRON:2,RETICCTPCT:2 in the last 72 hours  Micro Results: No results found for this or any previous visit (from the past 240 hour(s)).  Studies/Results: Ct Abdomen Pelvis W Contrast  01/30/2012  *RADIOLOGY REPORT*  Clinical Data: Abdominal pain, onset today.  History of cholecystectomy.  CT ABDOMEN AND PELVIS WITH CONTRAST  Technique:  Multidetector CT imaging of the abdomen and pelvis was performed following the standard protocol during bolus administration of intravenous contrast.  Contrast: 100mL OMNIPAQUE IOHEXOL 300 MG/ML  SOLN  Comparison: Acute abdominal series 01/30/2012.  Abdominal pelvic CT 08/01/2011.  Findings: There is stable scarring in both lung bases.  There is new mild biliary dilatation status post cholecystectomy.  The common bile duct measures up to 10 mm in diameter.  Most apparent on the reformatted images is a probable faintly calcified approximately 8 mm stone in the distal common bile duct (best seen on coronal image 47 and sagittal image 48). There is mild surrounding soft tissue swelling within the porta hepatis.  There is no evidence of pancreatic mass or pancreatic ductal dilatation.  There is a diverticulum of the second portion of the   duodenum.  There are mildly prominent lymph nodes within the porta hepatis, likely reactive.  A vague area of increased density within the medial segment of the left hepatic lobe on image 13 is stable.  No new focal lesions are identified.  There is mild atrophy within the left hepatic lobe. The spleen and adrenal glands appear normal.  There are stable bilateral renal cysts.  No enhancing renal masses are identified.  There is no hydronephrosis.  The bowel gas pattern is normal.  There are chronic diverticular changes of the sigmoid colon without surrounding inflammation. There is stable mild aneurysmal dilatation of  the abdominal aorta. 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  Foley catheter has been removed.  There is mild chronic bladder wall thickening, likely secondary to BPH.  Prominent fat within both inguinal canals is stable.  There are stable degenerative changes of the spine associated with a scoliosis.  There are stable changes of chronic osteonecrosis involving the left femoral head. There is no subchondral collapse.  IMPRESSION:  1.  Choledocholithiasis with mild biliary dilatation and surrounding soft tissue stranding.  Superimposed early cholangitis cannot be excluded.  2.  Probable reactive nodes within the porta hepatis. 3.  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  Original Report Authenticated By: WILLIAM B. VEAZEY, M.D.   Dg Abd Acute W/chest  01/30/2012  *RADIOLOGY REPORT*  Clinical Data: Shortness of breath and constipation.  ACUTE ABDOMEN SERIES (ABDOMEN 2 VIEW & CHEST 1 VIEW)  Comparison: Acute abdominal series 08/04/2011 and abdominal radiograph 08/05/2011.  CT abdomen pelvis 08/01/2011.  Findings: Heart, mediastinal, hilar contours within normal limits. Minimal left basilar atelectasis.  Otherwise, the lungs are clear. No free intraperitoneal air.  Gas is seen scattered throughout the colon, and within some upper quadrant and small bowel loops.  A small bowel loops measure up to 2.2 cm in caliber, within normal limits.  Rectal gas is seen. There is some scattered stool in the proximal colon.  No significant stool in the distal colon or rectum. No acute bony abnormality identified.  IMPRESSION: Nonobstructive bowel gas pattern.  No significant stool burden identified.  Original Report Authenticated By: SUSAN TURNER, M.D.    Medications: I have reviewed the patient's current medications. Scheduled Meds:   . ciprofloxacin  400 mg Intravenous Once  . ciprofloxacin  400 mg  Intravenous Q12H  . ertapenem  1 g Intravenous Once  . HYDROmorphone      . metoprolol  12.5 mg Oral BID  . metronidazole  500 mg Intravenous Q8H  .  morphine injection  2 mg Intravenous Once  . olmesartan  20 mg Oral Daily  . ondansetron (ZOFRAN) IV  4 mg Intravenous Once  . pantoprazole (PROTONIX) IV  40 mg Intravenous Q12H   Continuous Infusions:   . sodium chloride 150 mL/hr at 01/30/12 2136   PRN Meds:.HYDROmorphone (DILAUDID) injection, iohexol  Assessment/Plan: Abdominal pain secondary to choledocholithiasis- with ? Component of cholangitis and pancreatisis:  NPO, GI consulted, cipro/flagyl, protonix Plan for ERCP tomm.  HLD HTN  Leukocytosis- resolved    LOS: 1 day  Jerusha Reising, DO 01/31/2012, 9:02 AM 

## 2012-01-31 NOTE — Consult Note (Signed)
Reason for Consult: Choledocholithiasis Referring Physician: [facility]  [name] HPI: This is an 78 year old patient with complaints of acute epigastric pain.  The pain was very sharp and he presented to the ER for further evaluation.  He is s/p cholecystectomy in 2008, but he cannot recall the reason for the procedure.  Two weeks ago he had similar epigastric pain, but it was not to this severity.  No fever, nausea, or vomiting.  At this time he does report feeling better.  A CT scan was performed and there is evidence of an 8 mm calcified stone in the distal CBD.  There is mild biliary ductal dilation and surrounding soft tissue stranding.    Past Medical History  Diagnosis Date  . DIVERTICULOSIS, COLON 10/07/2006    pt. has freq boutw with constipation alternates with diarrhea  . HYPERLIPIDEMIA 10/07/2006  . GERD 10/06/2008  . HYPERTROPHY PROSTATE W/UR OBST & OTH LUTS 06/03/2009  . TORTICOLLIS 10/27/2009  . Memory loss 08/12/2007  . Palpitations 03/22/2010  . ACTION TREMOR 08/12/2007  . Meralgia paraesthetica   . Syncope   . Urosepsis 2007    with syncope  . Prostatitis 2007  . HYPERTENSION 10/07/2006  . Skin cancer   . Microscopic hematuria 07-24-11    no visual blood, last UTI was 10'12,tx. with antibiotic  . Vertigo 07-24-11    x2   . Bladder cancer     Dr Wilson Singer; Chemotherapy instilled through catheter weekly X 6 weeks in his office    Past Surgical History  Procedure Date  . Tonsillectomy   . Colonoscopy      Diverticulosis  . Cholecystectomy 2008    approx. 4 yrs ago(attacks)  . Cataract extraction, bilateral     bilateral lens implants  . Transurethral resection of bladder tumor 07/27/2011    Procedure: TRANSURETHRAL RESECTION OF BLADDER TUMOR (TURBT);  Surgeon: Anner Crete;  Location: [facility];  Service: Urology;  Laterality: N/A;  Cystoscopy/Transurethral Resection of Bladder Tumor  . Urinary retention     post Urologic surgery    Family History  Problem  Relation Age of Onset  . Heart attack Father 43  . Colon cancer Mother 29  . Hypertension Mother   . Cancer Mother     Colon Cancer  . Breast cancer Maternal Grandmother   . Cancer Maternal Grandmother     Breast Cancer  . Heart attack Maternal Grandfather     late 36s  . Heart failure Sister   . Heart disease Sister     CHF    Social History:  reports that he quit smoking about 38 years ago. He has never used smokeless tobacco. He reports that he drinks alcohol. He reports that he does not use illicit drugs.  Allergies:  Allergies  Allergen Reactions  . Ramipril Other (See Comments)    Pt doesn't remember if he is allergic to.   . Tamsulosin Other (See Comments)    Unknown   . Cephalexin Other (See Comments)    Bloody loose stools    Medications:  Scheduled:   . ciprofloxacin  400 mg Intravenous Once  . ciprofloxacin  400 mg Intravenous Q12H  . ertapenem  1 g Intravenous Once  . HYDROmorphone      . metoprolol  12.5 mg Oral BID  . metronidazole  500 mg Intravenous Q8H  .  morphine injection  2 mg Intravenous Once  . olmesartan  20 mg Oral Daily  . ondansetron (ZOFRAN)  IV  4 mg Intravenous Once  . pantoprazole (PROTONIX) IV  40 mg Intravenous Q12H   Continuous:   . sodium chloride 150 mL/hr at 01/31/12 0958    Results for orders placed during the hospital encounter of 01/30/12 (from the past 24 hour(s))  CBC     Status: Abnormal   Collection Time   01/30/12 11:15 AM      Component Value Range   WBC 11.5 (*) 4.0 - 10.5 K/uL   RBC 4.76  4.22 - 5.81 MIL/uL   Hemoglobin 15.6  13.0 - 17.0 g/dL   HCT 86.5  78.4 - 69.6 %   MCV 94.7  78.0 - 100.0 fL   MCH 32.8  26.0 - 34.0 pg   MCHC 34.6  30.0 - 36.0 g/dL   RDW 29.5  28.4 - 13.2 %   Platelets 191  150 - 400 K/uL  DIFFERENTIAL     Status: Abnormal   Collection Time   01/30/12 11:15 AM      Component Value Range   Neutrophils Relative 90 (*) 43 - 77 %   Neutro Abs 10.3 (*) 1.7 - 7.7 K/uL   Lymphocytes Relative  5 (*) 12 - 46 %   Lymphs Abs 0.6 (*) 0.7 - 4.0 K/uL   Monocytes Relative 5  3 - 12 %   Monocytes Absolute 0.6  0.1 - 1.0 K/uL   Eosinophils Relative 0  0 - 5 %   Eosinophils Absolute 0.0  0.0 - 0.7 K/uL   Basophils Relative 0  0 - 1 %   Basophils Absolute 0.0  0.0 - 0.1 K/uL  COMPREHENSIVE METABOLIC PANEL     Status: Abnormal   Collection Time   01/30/12 11:15 AM      Component Value Range   Sodium 133 (*) 135 - 145 mEq/L   Potassium 3.9  3.5 - 5.1 mEq/L   Chloride 100  96 - 112 mEq/L   CO2 21  19 - 32 mEq/L   Glucose, Bld 125 (*) 70 - 99 mg/dL   BUN 9  6 - 23 mg/dL   Creatinine, Ser 4.40  0.50 - 1.35 mg/dL   Calcium 9.0  8.4 - 10.2 mg/dL   Total Protein 6.6  6.0 - 8.3 g/dL   Albumin 3.3 (*) 3.5 - 5.2 g/dL   AST 725 (*) 0 - 37 U/L   ALT 124 (*) 0 - 53 U/L   Alkaline Phosphatase 324 (*) 39 - 117 U/L   Total Bilirubin 3.1 (*) 0.3 - 1.2 mg/dL   GFR calc non Af Amer 76 (*) >90 mL/min   GFR calc Af Amer 88 (*) >90 mL/min  LIPASE, BLOOD     Status: Abnormal   Collection Time   01/30/12 11:15 AM      Component Value Range   Lipase 263 (*) 11 - 59 U/L  URINALYSIS, ROUTINE W REFLEX MICROSCOPIC     Status: Abnormal   Collection Time   01/30/12 11:56 AM      Component Value Range   Color, Urine ORANGE (*) YELLOW   APPearance CLEAR  CLEAR   Specific Gravity, Urine 1.022  1.005 - 1.030   pH 7.0  5.0 - 8.0   Glucose, UA NEGATIVE  NEGATIVE mg/dL   Hgb urine dipstick NEGATIVE  NEGATIVE   Bilirubin Urine MODERATE (*) NEGATIVE   Ketones, ur TRACE (*) NEGATIVE mg/dL   Protein, ur NEGATIVE  NEGATIVE mg/dL   Urobilinogen, UA 2.0 (*) 0.0 -  1.0 mg/dL   Nitrite NEGATIVE  NEGATIVE   Leukocytes, UA SMALL (*) NEGATIVE  URINE MICROSCOPIC-ADD ON     Status: Abnormal   Collection Time   01/30/12 11:56 AM      Component Value Range   Squamous Epithelial / LPF FEW (*) RARE   WBC, UA 3-6  <3 WBC/hpf   Bacteria, UA FEW (*) RARE   Urine-Other MUCOUS PRESENT    COMPREHENSIVE METABOLIC PANEL      Status: Abnormal   Collection Time   01/31/12  5:10 AM      Component Value Range   Sodium 135  135 - 145 mEq/L   Potassium 3.8  3.5 - 5.1 mEq/L   Chloride 102  96 - 112 mEq/L   CO2 25  19 - 32 mEq/L   Glucose, Bld 93  70 - 99 mg/dL   BUN 8  6 - 23 mg/dL   Creatinine, Ser 1.61  0.50 - 1.35 mg/dL   Calcium 8.3 (*) 8.4 - 10.5 mg/dL   Total Protein 5.5 (*) 6.0 - 8.3 g/dL   Albumin 2.7 (*) 3.5 - 5.2 g/dL   AST 096 (*) 0 - 37 U/L   ALT 107 (*) 0 - 53 U/L   Alkaline Phosphatase 258 (*) 39 - 117 U/L   Total Bilirubin 6.2 (*) 0.3 - 1.2 mg/dL   GFR calc non Af Amer 57 (*) >90 mL/min   GFR calc Af Amer 66 (*) >90 mL/min  CBC     Status: Normal   Collection Time   01/31/12  5:10 AM      Component Value Range   WBC 8.7  4.0 - 10.5 K/uL   RBC 4.25  4.22 - 5.81 MIL/uL   Hemoglobin 13.6  13.0 - 17.0 g/dL   HCT 04.5  40.9 - 81.1 %   MCV 95.8  78.0 - 100.0 fL   MCH 32.0  26.0 - 34.0 pg   MCHC 33.4  30.0 - 36.0 g/dL   RDW 91.4  78.2 - 95.6 %   Platelets 212  150 - 400 K/uL  DIFFERENTIAL     Status: Normal   Collection Time   01/31/12  5:10 AM      Component Value Range   Neutrophils Relative 71  43 - 77 %   Neutro Abs 6.2  1.7 - 7.7 K/uL   Lymphocytes Relative 20  12 - 46 %   Lymphs Abs 1.7  0.7 - 4.0 K/uL   Monocytes Relative 6  3 - 12 %   Monocytes Absolute 0.5  0.1 - 1.0 K/uL   Eosinophils Relative 3  0 - 5 %   Eosinophils Absolute 0.3  0.0 - 0.7 K/uL   Basophils Relative 1  0 - 1 %   Basophils Absolute 0.0  0.0 - 0.1 K/uL  APTT     Status: Normal   Collection Time   01/31/12  5:10 AM      Component Value Range   aPTT 32  24 - 37 seconds  PROTIME-INR     Status: Normal   Collection Time   01/31/12  5:10 AM      Component Value Range   Prothrombin Time 13.9  11.6 - 15.2 seconds   INR 1.05  0.00 - 1.49  LIPASE, BLOOD     Status: Normal   Collection Time   01/31/12  5:10 AM      Component Value Range   Lipase 42  11 -  59 U/L  LACTATE DEHYDROGENASE     Status: Normal    Collection Time   01/31/12  5:10 AM      Component Value Range   LDH 142  94 - 250 U/L  LIPID PANEL     Status: Abnormal   Collection Time   01/31/12  5:10 AM      Component Value Range   Cholesterol 141  0 - 200 mg/dL   Triglycerides 75  <161 mg/dL   HDL 36 (*) >09 mg/dL   Total CHOL/HDL Ratio 3.9     VLDL 15  0 - 40 mg/dL   LDL Cholesterol 90  0 - 99 mg/dL     Ct Abdomen Pelvis W Contrast  01/30/2012  *RADIOLOGY REPORT*  Clinical Data: Abdominal pain, onset today.  History of cholecystectomy.  CT ABDOMEN AND PELVIS WITH CONTRAST  Technique:  Multidetector CT imaging of the abdomen and pelvis was performed following the standard protocol during bolus administration of intravenous contrast.  Contrast: OMNIPAQUE IOHEXOL 300 MG/ML  SOLN  Comparison: Acute abdominal series 01/30/2012.  Abdominal pelvic CT 08/01/2011.  Findings: There is stable scarring in both lung bases.  There is new mild biliary dilatation status post cholecystectomy.  The common bile duct measures up to 10 mm in diameter.  Most apparent on the reformatted images is a probable faintly calcified approximately 8 mm stone in the distal common bile duct (best seen on coronal image 47 and sagittal image 48). There is mild surrounding soft tissue swelling within the porta hepatis.  There is no evidence of pancreatic mass or pancreatic ductal dilatation.  There is a diverticulum of the second portion of the duodenum.  There are mildly prominent lymph nodes within the porta hepatis, likely reactive.  A vague area of increased density within the medial segment of the left hepatic lobe on image 13 is stable.  No new focal lesions are identified.  There is mild atrophy within the left hepatic lobe. The spleen and adrenal glands appear normal.  There are stable bilateral renal cysts.  No enhancing renal masses are identified.  There is no hydronephrosis.  The bowel gas pattern is normal.  There are chronic diverticular changes of the  sigmoid colon without surrounding inflammation. There is stable mild aneurysmal dilatation of the abdominal aorta. 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  Foley catheter has been removed.  There is mild chronic bladder wall thickening, likely secondary to BPH.  Prominent fat within both inguinal canals is stable.  There are stable degenerative changes of the spine associated with a scoliosis.  There are stable changes of chronic osteonecrosis involving the left femoral head. There is no subchondral collapse.  IMPRESSION:  1.  Choledocholithiasis with mild biliary dilatation and surrounding soft tissue stranding.  Superimposed early cholangitis cannot be excluded.  2.  Probable reactive nodes within the porta hepatis. 3.  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  Original Report Authenticated By: Gerrianne Scale, M.D.   Dg Abd Acute W/chest  01/30/2012  *RADIOLOGY REPORT*  Clinical Data: Shortness of breath and constipation.  ACUTE ABDOMEN SERIES (ABDOMEN 2 VIEW & CHEST 1 VIEW)  Comparison: Acute abdominal series 08/04/2011 and abdominal radiograph 08/05/2011.  CT abdomen pelvis 08/01/2011.  Findings: Heart, mediastinal, hilar contours within normal limits. Minimal left basilar atelectasis.  Otherwise, the lungs are clear. No free intraperitoneal air.  Gas is seen scattered throughout the colon, and within some upper quadrant and small bowel loops.  A small bowel loops measure up to 2.2 cm in caliber, within normal limits.  Rectal gas is seen. There is some scattered stool in the proximal colon.  No significant stool in the distal colon or rectum. No acute bony abnormality identified.  IMPRESSION: Nonobstructive bowel gas pattern.  No significant stool burden identified.  Original Report Authenticated By: Britta Mccreedy, M.D.    ROS:  As stated above in the HPI otherwise negative.  Blood  pressure 117/72, pulse 72, temperature 98.3 F (36.8 C), temperature source Oral, resp. rate 18, height 5\' 9"  (1.753 m), weight 86.6 kg (190 lb 14.7 oz), SpO2 92.00%.    PE: Gen: NAD, Alert and Oriented HEENT:  [place]/AT, EOMI Neck: Supple, no LAD Lungs: CTA Bilaterally CV: RRR without M/G/R ABM: Soft, NTND, +BS Ext: No C/C/E  Assessment/Plan: 1) Choledocholithiasis. 2) Gallstone pancreatitis 3) Soft tissue stranding in the porta hepatis.   The patient requires an ERCP at this time.  It is felt that the soft tissue stranding and lymph nodes are reactive in nature and possibly significant for an early cholangitis.  He is stable at this time.  His procedure may be more difficult in that he had a diverticulum in the duodenum.  After the ERCP I will have to monitor him to make sure that the soft tissue stranding does not represent anything more ominous.  Plan: 1) ERCP 02/01/2012.  Ayzia Day D 01/31/2012, 8:00 AM

## 2012-01-31 NOTE — Progress Notes (Signed)
CARE MANAGEMENT NOTE 01/31/2012  Patient:  [name], [name]   Account Number:  [id number]  Date Initiated:  01/31/2012  Documentation initiated by:  Ceanna Wareing  Subjective/Objective Assessment:   pt with pancreatitis     Action/Plan:   lives at home   Anticipated DC Date:  02/03/2012   Anticipated DC Plan:  HOME/SELF CARE  In-house referral  NA      DC Planning Services  NA      [facility] Choice  NA   Choice offered to / List presented to:  NA   DME arranged  NA      DME agency  NA     HH arranged  NA      HH agency  NA   Status of service:  In process, will continue to follow Medicare Important Message given?  NA - LOS <3 / Initial given by admissions (If response is "NO", the following Medicare IM given date fields will be blank) Date Medicare IM given:   Date Additional Medicare IM given:    Discharge Disposition:    Per UR Regulation:  Reviewed for med. necessity/level of care/duration of stay  If discussed at Long Length of Stay Meetings, dates discussed:    Comments:  [id number] Marcelle Smiling, RN, BSN, CCM No discharge needs present at time of this review Case Management [phone]

## 2012-01-31 NOTE — H&P (Deleted)
Subjective: Nausea and vomiting has resolved No fever No chills Wife at bedside  Objective: Vital signs in last 24 hours: Filed Vitals:   01/30/12 1733 01/30/12 2014 01/30/12 2039 01/31/12 0615  BP: 107/53 127/62 131/68 117/72  Pulse: 86 86 87 72  Temp: 98.7 F (37.1 C) 99.9 F (37.7 C) 99.5 F (37.5 C) 98.3 F (36.8 C)  TempSrc: Oral Oral Oral Oral  Resp: 17 14 16 18   Height:   5\' 9"  (1.753 m)   Weight:   84.4 kg (186 lb 1.1 oz) 86.6 kg (190 lb 14.7 oz)  SpO2: 95% 94% 93% 92%   Weight change:   Intake/Output Summary (Last 24 hours) at 01/31/12 0902 Last data filed at 01/31/12 0600  Gross per 24 hour  Intake   1560 ml  Output   1250 ml  Net    310 ml    Physical Exam: General: Awake, Oriented, No acute distress. HEENT: EOMI. Neck: Supple CV: S1 and S2, rrr Lungs: Clear to ascultation bilaterally, no wheezing Abdomen: Soft, Nontender, Nondistended, +bowel sounds. Ext: Good pulses. Trace edema.   Lab Results:  [facility] 01/31/12 0510 01/30/12 1115  NA 135 133*  K 3.8 3.9  CL 102 100  CO2 25 21  GLUCOSE 93 125*  BUN 8 9  CREATININE 1.15 0.91  CALCIUM 8.3* 9.0  MG -- --  PHOS -- --    Basename 01/31/12 0510 01/30/12 1115  AST 123* 171*  ALT 107* 124*  ALKPHOS 258* 324*  BILITOT 6.2* 3.1*  PROT 5.5* 6.6  ALBUMIN 2.7* 3.3*    Basename 01/31/12 0510 01/30/12 1115  LIPASE 42 263*  AMYLASE -- --    Alvira Philips 01/31/12 0510 01/30/12 1115  WBC 8.7 11.5*  NEUTROABS 6.2 10.3*  HGB 13.6 15.6  HCT 40.7 45.1  MCV 95.8 94.7  PLT 212 191   No results found for this basename: CKTOTAL:3,CKMB:3,CKMBINDEX:3,TROPONINI:3 in the last 72 hours No components found with this basename: POCBNP:3 No results found for this basename: DDIMER:2 in the last 72 hours No results found for this basename: HGBA1C:2 in the last 72 hours  Basename 01/31/12 0510  CHOL 141  HDL 36*  LDLCALC 90  TRIG 75  CHOLHDL 3.9  LDLDIRECT --   No results found for this basename:  TSH,T4TOTAL,FREET3,T3FREE,THYROIDAB in the last 72 hours No results found for this basename: VITAMINB12:2,FOLATE:2,FERRITIN:2,TIBC:2,IRON:2,RETICCTPCT:2 in the last 72 hours  Micro Results: No results found for this or any previous visit (from the past 240 hour(s)).  Studies/Results: Ct Abdomen Pelvis W Contrast  01/30/2012  *RADIOLOGY REPORT*  Clinical Data: Abdominal pain, onset today.  History of cholecystectomy.  CT ABDOMEN AND PELVIS WITH CONTRAST  Technique:  Multidetector CT imaging of the abdomen and pelvis was performed following the standard protocol during bolus administration of intravenous contrast.  Contrast: OMNIPAQUE IOHEXOL 300 MG/ML  SOLN  Comparison: Acute abdominal series 01/30/2012.  Abdominal pelvic CT 08/01/2011.  Findings: There is stable scarring in both lung bases.  There is new mild biliary dilatation status post cholecystectomy.  The common bile duct measures up to 10 mm in diameter.  Most apparent on the reformatted images is a probable faintly calcified approximately 8 mm stone in the distal common bile duct (best seen on coronal image 47 and sagittal image 48). There is mild surrounding soft tissue swelling within the porta hepatis.  There is no evidence of pancreatic mass or pancreatic ductal dilatation.  There is a diverticulum of the second portion of the  duodenum.  There are mildly prominent lymph nodes within the porta hepatis, likely reactive.  A vague area of increased density within the medial segment of the left hepatic lobe on image 13 is stable.  No new focal lesions are identified.  There is mild atrophy within the left hepatic lobe. The spleen and adrenal glands appear normal.  There are stable bilateral renal cysts.  No enhancing renal masses are identified.  There is no hydronephrosis.  The bowel gas pattern is normal.  There are chronic diverticular changes of the sigmoid colon without surrounding inflammation. There is stable mild aneurysmal dilatation of  the abdominal aorta. 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  Foley catheter has been removed.  There is mild chronic bladder wall thickening, likely secondary to BPH.  Prominent fat within both inguinal canals is stable.  There are stable degenerative changes of the spine associated with a scoliosis.  There are stable changes of chronic osteonecrosis involving the left femoral head. There is no subchondral collapse.  IMPRESSION:  1.  Choledocholithiasis with mild biliary dilatation and surrounding soft tissue stranding.  Superimposed early cholangitis cannot be excluded.  2.  Probable reactive nodes within the porta hepatis. 3.  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  Original Report Authenticated By: Gerrianne Scale, M.D.   Dg Abd Acute W/chest  01/30/2012  *RADIOLOGY REPORT*  Clinical Data: Shortness of breath and constipation.  ACUTE ABDOMEN SERIES (ABDOMEN 2 VIEW & CHEST 1 VIEW)  Comparison: Acute abdominal series 08/04/2011 and abdominal radiograph 08/05/2011.  CT abdomen pelvis 08/01/2011.  Findings: Heart, mediastinal, hilar contours within normal limits. Minimal left basilar atelectasis.  Otherwise, the lungs are clear. No free intraperitoneal air.  Gas is seen scattered throughout the colon, and within some upper quadrant and small bowel loops.  A small bowel loops measure up to 2.2 cm in caliber, within normal limits.  Rectal gas is seen. There is some scattered stool in the proximal colon.  No significant stool in the distal colon or rectum. No acute bony abnormality identified.  IMPRESSION: Nonobstructive bowel gas pattern.  No significant stool burden identified.  Original Report Authenticated By: Britta Mccreedy, M.D.    Medications: I have reviewed the patient's current medications. Scheduled Meds:   . ciprofloxacin  400 mg Intravenous Once  . ciprofloxacin  400 mg  Intravenous Q12H  . ertapenem  1 g Intravenous Once  . HYDROmorphone      . metoprolol  12.5 mg Oral BID  . metronidazole  500 mg Intravenous Q8H  .  morphine injection  2 mg Intravenous Once  . olmesartan  20 mg Oral Daily  . ondansetron (ZOFRAN) IV  4 mg Intravenous Once  . pantoprazole (PROTONIX) IV  40 mg Intravenous Q12H   Continuous Infusions:   . sodium chloride 150 mL/hr at 01/30/12 2136   PRN Meds:.HYDROmorphone (DILAUDID) injection, iohexol  Assessment/Plan: Abdominal pain secondary to choledocholithiasis- with ? Component of cholangitis and pancreatisis:  NPO, GI consulted, cipro/flagyl, protonix Plan for ERCP tomm.  HLD HTN  Leukocytosis- resolved    LOS: 1 day  Clois Treanor, DO 01/31/2012, 9:02 AM

## 2012-02-01 ENCOUNTER — Encounter (HOSPITAL_COMMUNITY): Payer: Self-pay | Admitting: *Deleted

## 2012-02-01 ENCOUNTER — Inpatient Hospital Stay (HOSPITAL_COMMUNITY): Payer: Medicare Other

## 2012-02-01 ENCOUNTER — Encounter (HOSPITAL_COMMUNITY)
Admission: EM | Disposition: A | Discharge status: Home / Self Care | Payer: Self-pay | Source: Home / Self Care | Attending: Internal Medicine

## 2012-02-01 DIAGNOSIS — G252 Other specified forms of tremor: Secondary | ICD-10-CM

## 2012-02-01 DIAGNOSIS — G250 Essential tremor: Secondary | ICD-10-CM

## 2012-02-01 DIAGNOSIS — I10 Essential (primary) hypertension: Secondary | ICD-10-CM

## 2012-02-01 DIAGNOSIS — R1013 Epigastric pain: Secondary | ICD-10-CM

## 2012-02-01 DIAGNOSIS — K219 Gastro-esophageal reflux disease without esophagitis: Secondary | ICD-10-CM

## 2012-02-01 HISTORY — PX: ERCP: SHX5425

## 2012-02-01 LAB — URINE CULTURE
Colony Count: NO GROWTH
Culture  Setup Time: 201306120148

## 2012-02-01 SURGERY — ERCP, WITH INTERVENTION IF INDICATED
Anesthesia: Moderate Sedation

## 2012-02-01 MED ORDER — DIPHENHYDRAMINE HCL 50 MG/ML IJ SOLN
INTRAMUSCULAR | Status: DC | PRN
  Administered 2012-02-01: 25 mg via INTRAVENOUS

## 2012-02-01 MED ORDER — GLUCAGON HCL (RDNA) 1 MG IJ SOLR
INTRAMUSCULAR | Status: AC
  Filled 2012-02-01: qty 2

## 2012-02-01 MED ORDER — FENTANYL CITRATE 0.05 MG/ML IJ SOLN
INTRAMUSCULAR | Status: DC | PRN
  Administered 2012-02-01 (×4): 25 ug via INTRAVENOUS

## 2012-02-01 MED ORDER — SODIUM CHLORIDE 0.9 % IJ SOLN
INTRAMUSCULAR | Status: DC | PRN
  Administered 2012-02-01: 17:00:00

## 2012-02-01 MED ORDER — MIDAZOLAM HCL 10 MG/2ML IJ SOLN
INTRAMUSCULAR | Status: DC | PRN
  Administered 2012-02-01 (×3): 1 mg via INTRAVENOUS
  Administered 2012-02-01 (×2): 2 mg via INTRAVENOUS

## 2012-02-01 MED ORDER — BUTAMBEN-TETRACAINE-BENZOCAINE 2-2-14 % EX AERO
INHALATION_SPRAY | CUTANEOUS | Status: DC | PRN
  Administered 2012-02-01: 2 via TOPICAL

## 2012-02-01 MED ORDER — SODIUM CHLORIDE 0.9 % IV SOLN
INTRAVENOUS | Status: DC | PRN
  Administered 2012-02-01: 17:00:00

## 2012-02-01 MED ORDER — LORAZEPAM 2 MG/ML IJ SOLN
1.0000 mg | Freq: Once | INTRAMUSCULAR | Status: AC
  Administered 2012-02-01: 1 mg via INTRAVENOUS

## 2012-02-01 NOTE — Interval H&P Note (Signed)
History and Physical Interval Note:  02/01/2012 4:35 PM  [name]  has presented today for surgery, with the diagnosis of Choledocholithiasis  The various methods of treatment have been discussed with the patient and family. After consideration of risks, benefits and other options for treatment, the patient has consented to  Procedure(s) (LRB): ENDOSCOPIC RETROGRADE CHOLANGIOPANCREATOGRAPHY (ERCP) (N/A) as a surgical intervention .  The patients' history has been reviewed, patient examined, no change in status, stable for surgery.  I have reviewed the patients' chart and labs.  Questions were answered to the patient's satisfaction.     [name] D

## 2012-02-01 NOTE — Op Note (Signed)
[facility] [address] [place], [place]  [address]  ERCP PROCEDURE REPORT  PATIENT:  [name], [name]  MR#:  [id number] BIRTHDATE:  10-21-1925  GENDER:  male ENDOSCOPIST:  Jeani Hawking, MD ASSISTANT:  Angelique Blonder, Jimmey Ralph, RN, CGRN, Felecia Shelling, RN, Kandice Robinsons PROCEDURE DATE:  02/01/2012 PROCEDURE:  ERCP with removal of stones ASA CLASS:  Class II INDICATIONS:  stone  MEDICATIONS:   Fentanyl 100 mcg IV, Versed 7 mg IV, Benadryl 25 mg IV TOPICAL ANESTHETIC:  Cetacaine Spray  DESCRIPTION OF PROCEDURE:   After the risks benefits and alternatives of the procedure were thoroughly explained, informed consent was obtained.  The pentax [id number] endoscope was introduced through the mouth and advanced to the second portion of the duodenum.  The ampulla was large and there were two periampullary diverticula. Initiall cannulation resulted in PD cannulation, however, with maneuvering the [facility] was cannulated. The Al Pimple was secureed in the left intrahepatic ducts. Contrast injection revealed a dilated CBD at 15 mm. Two 1 cm distal CBD stones were identified. A 1 cm sphincterotomy was created, but it was technically difficult as the sphincterotome was not bowing a significant degree. A minor amount of bleeding occurred and 1:10:000 Epi was sprayed, however, at that time it appeared that the bleeding spontaneously arrested. A 15 mm balloon was inserted and the distal stone was removed first and then the proximal stone. At this time the patient was very combative and the duodenoscope was removed. Once the patient was sedated again the duodenoscope was reinserted and an occlusion cholangiogram was performed. There was no evidence of retained stones. At this time the procedure was terminated.    The scope was then completely withdrawn from the patient and the procedure terminated. <<PROCEDUREIMAGES>>  COMPLICATIONS:  None ENDOSCOPIC IMPRESSION: 1) Stones, multiple 2)  Periampullary diverticula RECOMMENDATIONS: 1) Follow clinically. 2) D/C Home tomorrow if stable. 3) Follow up in the office in one month.  ______________________________ Jeani Hawking, MD  n. Rosalie DoctorJeani Hawking at 02/01/2012 05:39 PM  Gwynneth Munson, [phone]

## 2012-02-01 NOTE — Progress Notes (Signed)
Subjective: No nausea/vomiting No fever, no chills No abd pain Asking for food   Objective: Vital signs in last 24 hours: Filed Vitals:   01/31/12 0615 01/31/12 1401 01/31/12 2119 02/01/12 0535  BP: 117/72 124/62 148/71 122/53  Pulse: 72 75 85 70  Temp: 98.3 F (36.8 C) 98.7 F (37.1 C) 97.6 F (36.4 C) 98.7 F (37.1 C)  TempSrc: Oral Oral Oral Oral  Resp: 18 18 18 18   Height:      Weight: 86.6 kg (190 lb 14.7 oz)     SpO2: 92% 94% 95% 95%   Weight change:   Intake/Output Summary (Last 24 hours) at 02/01/12 0945 Last data filed at 02/01/12 0847  Gross per 24 hour  Intake   3645 ml  Output   2020 ml  Net   1625 ml    Physical Exam: General: Awake, Oriented, No acute distress. HEENT: EOMI. Neck: Supple CV: S1 and S2, rrr Lungs: Clear to ascultation bilaterally, no wheezing Abdomen: Soft, Nontender, Nondistended, +bowel sounds. Ext: Good pulses. Trace edema.    Lab Results:  [facility] 01/31/12 0510 01/30/12 1115  NA 135 133*  K 3.8 3.9  CL 102 100  CO2 25 21  GLUCOSE 93 125*  BUN 8 9  CREATININE 1.15 0.91  CALCIUM 8.3* 9.0  MG -- --  PHOS -- --    Basename 01/31/12 0510 01/30/12 1115  AST 123* 171*  ALT 107* 124*  ALKPHOS 258* 324*  BILITOT 6.2* 3.1*  PROT 5.5* 6.6  ALBUMIN 2.7* 3.3*    Basename 01/31/12 0510 01/30/12 1115  LIPASE 42 263*  AMYLASE -- --    Alvira Philips 01/31/12 0510 01/30/12 1115  WBC 8.7 11.5*  NEUTROABS 6.2 10.3*  HGB 13.6 15.6  HCT 40.7 45.1  MCV 95.8 94.7  PLT 212 191   No results found for this basename: CKTOTAL:3,CKMB:3,CKMBINDEX:3,TROPONINI:3 in the last 72 hours No components found with this basename: POCBNP:3 No results found for this basename: DDIMER:2 in the last 72 hours No results found for this basename: HGBA1C:2 in the last 72 hours  Basename 01/31/12 0510  CHOL 141  HDL 36*  LDLCALC 90  TRIG 75  CHOLHDL 3.9  LDLDIRECT --   No results found for this basename: TSH,T4TOTAL,FREET3,T3FREE,THYROIDAB in  the last 72 hours No results found for this basename: VITAMINB12:2,FOLATE:2,FERRITIN:2,TIBC:2,IRON:2,RETICCTPCT:2 in the last 72 hours  Micro Results: Recent Results (from the past 240 hour(s))  URINE CULTURE     Status: Normal   Collection Time   01/30/12  1:58 PM      Component Value Range Status Comment   Specimen Description URINE, RANDOM   Final    Special Requests NONE   Final    Culture  Setup Time [id number]   Final    Colony Count NO GROWTH   Final    Culture NO GROWTH   Final    Report Status 02/01/2012 FINAL   Final     Studies/Results: Ct Abdomen Pelvis W Contrast  01/30/2012  *RADIOLOGY REPORT*  Clinical Data: Abdominal pain, onset today.  History of cholecystectomy.  CT ABDOMEN AND PELVIS WITH CONTRAST  Technique:  Multidetector CT imaging of the abdomen and pelvis was performed following the standard protocol during bolus administration of intravenous contrast.  Contrast: OMNIPAQUE IOHEXOL 300 MG/ML  SOLN  Comparison: Acute abdominal series 01/30/2012.  Abdominal pelvic CT 08/01/2011.  Findings: There is stable scarring in both lung bases.  There is new mild biliary dilatation status post cholecystectomy.  The  common bile duct measures up to 10 mm in diameter.  Most apparent on the reformatted images is a probable faintly calcified approximately 8 mm stone in the distal common bile duct (best seen on coronal image 47 and sagittal image 48). There is mild surrounding soft tissue swelling within the porta hepatis.  There is no evidence of pancreatic mass or pancreatic ductal dilatation.  There is a diverticulum of the second portion of the duodenum.  There are mildly prominent lymph nodes within the porta hepatis, likely reactive.  A vague area of increased density within the medial segment of the left hepatic lobe on image 13 is stable.  No new focal lesions are identified.  There is mild atrophy within the left hepatic lobe. The spleen and adrenal glands appear normal.  There  are stable bilateral renal cysts.  No enhancing renal masses are identified.  There is no hydronephrosis.  The bowel gas pattern is normal.  There are chronic diverticular changes of the sigmoid colon without surrounding inflammation. There is stable mild aneurysmal dilatation of the abdominal aorta. 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  Foley catheter has been removed.  There is mild chronic bladder wall thickening, likely secondary to BPH.  Prominent fat within both inguinal canals is stable.  There are stable degenerative changes of the spine associated with a scoliosis.  There are stable changes of chronic osteonecrosis involving the left femoral head. There is no subchondral collapse.  IMPRESSION:  1.  Choledocholithiasis with mild biliary dilatation and surrounding soft tissue stranding.  Superimposed early cholangitis cannot be excluded.  2.  Probable reactive nodes within the porta hepatis. 3.  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  Original Report Authenticated By: Gerrianne Scale, M.D.   Dg Abd Acute W/chest  01/30/2012  *RADIOLOGY REPORT*  Clinical Data: Shortness of breath and constipation.  ACUTE ABDOMEN SERIES (ABDOMEN 2 VIEW & CHEST 1 VIEW)  Comparison: Acute abdominal series 08/04/2011 and abdominal radiograph 08/05/2011.  CT abdomen pelvis 08/01/2011.  Findings: Heart, mediastinal, hilar contours within normal limits. Minimal left basilar atelectasis.  Otherwise, the lungs are clear. No free intraperitoneal air.  Gas is seen scattered throughout the colon, and within some upper quadrant and small bowel loops.  A small bowel loops measure up to 2.2 cm in caliber, within normal limits.  Rectal gas is seen. There is some scattered stool in the proximal colon.  No significant stool in the distal colon or rectum. No acute bony abnormality identified.  IMPRESSION: Nonobstructive  bowel gas pattern.  No significant stool burden identified.  Original Report Authenticated By: Britta Mccreedy, M.D.    Medications: I have reviewed the patient's current medications. Scheduled Meds:   . ciprofloxacin  400 mg Intravenous Q12H  . metoprolol  12.5 mg Oral BID  . metronidazole  500 mg Intravenous Q8H  . olmesartan  20 mg Oral Daily  . pantoprazole (PROTONIX) IV  40 mg Intravenous Q12H   Continuous Infusions:   . sodium chloride 150 mL/hr at 01/31/12 0958   PRN Meds:.HYDROmorphone (DILAUDID) injection  Assessment/Plan: Abdominal pain secondary to choledocholithiasis- with ? Component of cholangitis and pancreatisis: NPO, GI consulted, cipro/flagyl, protonix  Plan for ERCP today  lipase back to normal limits  HLD   HTN   Leukocytosis- resolved  Hope to D/C tomm    LOS: 2 days  Sai Zinn, DO 02/01/2012,  9:45 AM

## 2012-02-01 NOTE — H&P (View-Only) (Signed)
Subjective: No nausea/vomiting No fever, no chills No abd pain Asking for food   Objective: Vital signs in last 24 hours: Filed Vitals:   01/31/12 0615 01/31/12 1401 01/31/12 2119 02/01/12 0535  BP: 117/72 124/62 148/71 122/53  Pulse: 72 75 85 70  Temp: 98.3 F (36.8 C) 98.7 F (37.1 C) 97.6 F (36.4 C) 98.7 F (37.1 C)  TempSrc: Oral Oral Oral Oral  Resp: 18 18 18 18  Height:      Weight: 86.6 kg (190 lb 14.7 oz)     SpO2: 92% 94% 95% 95%   Weight change:   Intake/Output Summary (Last 24 hours) at 02/01/12 0945 Last data filed at 02/01/12 0847  Gross per 24 hour  Intake   3645 ml  Output   2020 ml  Net   1625 ml    Physical Exam: General: Awake, Oriented, No acute distress. HEENT: EOMI. Neck: Supple CV: S1 and S2, rrr Lungs: Clear to ascultation bilaterally, no wheezing Abdomen: Soft, Nontender, Nondistended, +bowel sounds. Ext: Good pulses. Trace edema.    Lab Results:  Basename 01/31/12 0510 01/30/12 1115  NA 135 133*  K 3.8 3.9  CL 102 100  CO2 25 21  GLUCOSE 93 125*  BUN 8 9  CREATININE 1.15 0.91  CALCIUM 8.3* 9.0  MG -- --  PHOS -- --    Basename 01/31/12 0510 01/30/12 1115  AST 123* 171*  ALT 107* 124*  ALKPHOS 258* 324*  BILITOT 6.2* 3.1*  PROT 5.5* 6.6  ALBUMIN 2.7* 3.3*    Basename 01/31/12 0510 01/30/12 1115  LIPASE 42 263*  AMYLASE -- --    Basename 01/31/12 0510 01/30/12 1115  WBC 8.7 11.5*  NEUTROABS 6.2 10.3*  HGB 13.6 15.6  HCT 40.7 45.1  MCV 95.8 94.7  PLT 212 191   No results found for this basename: CKTOTAL:3,CKMB:3,CKMBINDEX:3,TROPONINI:3 in the last 72 hours No components found with this basename: POCBNP:3 No results found for this basename: DDIMER:2 in the last 72 hours No results found for this basename: HGBA1C:2 in the last 72 hours  Basename 01/31/12 0510  CHOL 141  HDL 36*  LDLCALC 90  TRIG 75  CHOLHDL 3.9  LDLDIRECT --   No results found for this basename: TSH,T4TOTAL,FREET3,T3FREE,THYROIDAB in  the last 72 hours No results found for this basename: VITAMINB12:2,FOLATE:2,FERRITIN:2,TIBC:2,IRON:2,RETICCTPCT:2 in the last 72 hours  Micro Results: Recent Results (from the past 240 hour(s))  URINE CULTURE     Status: Normal   Collection Time   01/30/12  1:58 PM      Component Value Range Status Comment   Specimen Description URINE, RANDOM   Final    Special Requests NONE   Final    Culture  Setup Time [id number]   Final    Colony Count NO GROWTH   Final    Culture NO GROWTH   Final    Report Status 02/01/2012 FINAL   Final     Studies/Results: Ct Abdomen Pelvis W Contrast  01/30/2012  *RADIOLOGY REPORT*  Clinical Data: Abdominal pain, onset today.  History of cholecystectomy.  CT ABDOMEN AND PELVIS WITH CONTRAST  Technique:  Multidetector CT imaging of the abdomen and pelvis was performed following the standard protocol during bolus administration of intravenous contrast.  Contrast: 100mL OMNIPAQUE IOHEXOL 300 MG/ML  SOLN  Comparison: Acute abdominal series 01/30/2012.  Abdominal pelvic CT 08/01/2011.  Findings: There is stable scarring in both lung bases.  There is new mild biliary dilatation status post cholecystectomy.  The   common bile duct measures up to 10 mm in diameter.  Most apparent on the reformatted images is a probable faintly calcified approximately 8 mm stone in the distal common bile duct (best seen on coronal image 47 and sagittal image 48). There is mild surrounding soft tissue swelling within the porta hepatis.  There is no evidence of pancreatic mass or pancreatic ductal dilatation.  There is a diverticulum of the second portion of the duodenum.  There are mildly prominent lymph nodes within the porta hepatis, likely reactive.  A vague area of increased density within the medial segment of the left hepatic lobe on image 13 is stable.  No new focal lesions are identified.  There is mild atrophy within the left hepatic lobe. The spleen and adrenal glands appear normal.  There  are stable bilateral renal cysts.  No enhancing renal masses are identified.  There is no hydronephrosis.  The bowel gas pattern is normal.  There are chronic diverticular changes of the sigmoid colon without surrounding inflammation. There is stable mild aneurysmal dilatation of the abdominal aorta. 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  Foley catheter has been removed.  There is mild chronic bladder wall thickening, likely secondary to BPH.  Prominent fat within both inguinal canals is stable.  There are stable degenerative changes of the spine associated with a scoliosis.  There are stable changes of chronic osteonecrosis involving the left femoral head. There is no subchondral collapse.  IMPRESSION:  1.  Choledocholithiasis with mild biliary dilatation and surrounding soft tissue stranding.  Superimposed early cholangitis cannot be excluded.  2.  Probable reactive nodes within the porta hepatis. 3.  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  Original Report Authenticated By: WILLIAM B. VEAZEY, M.D.   Dg Abd Acute W/chest  01/30/2012  *RADIOLOGY REPORT*  Clinical Data: Shortness of breath and constipation.  ACUTE ABDOMEN SERIES (ABDOMEN 2 VIEW & CHEST 1 VIEW)  Comparison: Acute abdominal series 08/04/2011 and abdominal radiograph 08/05/2011.  CT abdomen pelvis 08/01/2011.  Findings: Heart, mediastinal, hilar contours within normal limits. Minimal left basilar atelectasis.  Otherwise, the lungs are clear. No free intraperitoneal air.  Gas is seen scattered throughout the colon, and within some upper quadrant and small bowel loops.  A small bowel loops measure up to 2.2 cm in caliber, within normal limits.  Rectal gas is seen. There is some scattered stool in the proximal colon.  No significant stool in the distal colon or rectum. No acute bony abnormality identified.  IMPRESSION: Nonobstructive  bowel gas pattern.  No significant stool burden identified.  Original Report Authenticated By: SUSAN TURNER, M.D.    Medications: I have reviewed the patient's current medications. Scheduled Meds:   . ciprofloxacin  400 mg Intravenous Q12H  . metoprolol  12.5 mg Oral BID  . metronidazole  500 mg Intravenous Q8H  . olmesartan  20 mg Oral Daily  . pantoprazole (PROTONIX) IV  40 mg Intravenous Q12H   Continuous Infusions:   . sodium chloride 150 mL/hr at 01/31/12 0958   PRN Meds:.HYDROmorphone (DILAUDID) injection  Assessment/Plan: Abdominal pain secondary to choledocholithiasis- with ? Component of cholangitis and pancreatisis: NPO, GI consulted, cipro/flagyl, protonix  Plan for ERCP today  lipase back to normal limits  HLD   HTN   Leukocytosis- resolved  Hope to D/C tomm    LOS: 2 days  Jaishon Krisher, DO 02/01/2012,   9:45 AM 

## 2012-02-02 ENCOUNTER — Encounter (HOSPITAL_COMMUNITY): Payer: Self-pay

## 2012-02-02 ENCOUNTER — Encounter (HOSPITAL_COMMUNITY): Payer: Self-pay | Admitting: Gastroenterology

## 2012-02-02 DIAGNOSIS — G252 Other specified forms of tremor: Secondary | ICD-10-CM

## 2012-02-02 DIAGNOSIS — K219 Gastro-esophageal reflux disease without esophagitis: Secondary | ICD-10-CM

## 2012-02-02 DIAGNOSIS — G250 Essential tremor: Secondary | ICD-10-CM

## 2012-02-02 DIAGNOSIS — R1013 Epigastric pain: Secondary | ICD-10-CM

## 2012-02-02 DIAGNOSIS — I10 Essential (primary) hypertension: Secondary | ICD-10-CM

## 2012-02-02 LAB — CBC
HCT: 39.1 % (ref 39.0–52.0)
MCHC: 33 g/dL (ref 30.0–36.0)
MCV: 96.8 fL (ref 78.0–100.0)
Platelets: 187 10*3/uL (ref 150–400)
RDW: 14.2 % (ref 11.5–15.5)
WBC: 7 10*3/uL (ref 4.0–10.5)

## 2012-02-02 MED ORDER — METRONIDAZOLE 500 MG PO TABS: 500.0000 mg | ORAL_TABLET | Freq: Three times a day (TID) | ORAL | Status: AC

## 2012-02-02 MED ORDER — LORAZEPAM 2 MG/ML IJ SOLN
INTRAMUSCULAR | Status: AC
  Filled 2012-02-02: qty 1

## 2012-02-02 MED ORDER — PANTOPRAZOLE SODIUM 40 MG PO TBEC: 40.0000 mg | DELAYED_RELEASE_TABLET | Freq: Two times a day (BID) | ORAL | Status: DC

## 2012-02-02 MED ORDER — CIPROFLOXACIN HCL 500 MG PO TABS: 500.0000 mg | ORAL_TABLET | Freq: Two times a day (BID) | ORAL | Status: AC

## 2012-02-02 MED ORDER — CIPROFLOXACIN HCL 500 MG PO TABS
500.0000 mg | ORAL_TABLET | Freq: Two times a day (BID) | ORAL | Status: DC
  Filled 2012-02-02 (×2): qty 1

## 2012-02-02 MED ORDER — METRONIDAZOLE 500 MG PO TABS
500.0000 mg | ORAL_TABLET | Freq: Three times a day (TID) | ORAL | Status: DC
  Administered 2012-02-02: 500 mg via ORAL
  Filled 2012-02-02 (×3): qty 1

## 2012-02-02 NOTE — Progress Notes (Signed)
The patient is receiving Protonix by the intravenous route.  Based on criteria approved by the Pharmacy and Therapeutics Committee and the Medical Executive Committee, the medication is being converted to the equivalent oral dose form.  These criteria include: -No Active GI bleeding -Able to tolerate diet of full liquids (or better) or tube feeding OR able to tolerate other medications by the oral or enteral route  If you have any questions about this conversion, please contact the Pharmacy Department (ext [phone]).  Thank you.  Girard Koontz, Loma Messing PharmD 1:13 PM 02/02/2012

## 2012-02-02 NOTE — Progress Notes (Signed)
Patient discharged home in stable condition with wife present. Discharge instructions and teaching given to patient and wife with feedback and understanding.

## 2012-02-02 NOTE — Progress Notes (Signed)
Subjective: No acute events.  No complaints of abdominal pain, but he does have a sore throat.  Objective: Vital signs in last 24 hours: Temp:  [97.9 F (36.6 C)-98.7 F (37.1 C)] 97.9 F (36.6 C) (06/14 0610) Pulse Rate:  [62-75] 62  (06/14 0610) Resp:  [9-22] 18  (06/14 0610) BP: (91-156)/(39-84) 106/63 mmHg (06/14 0610) SpO2:  [92 %-99 %] 95 % (06/14 0610) Weight:  [88.3 kg (194 lb 10.7 oz)] 88.3 kg (194 lb 10.7 oz) (06/14 0610) Last BM Date: 02/01/12  Intake/Output from previous day: 06/13 0701 - 06/14 0700 In: 4110 [I.V.:3410; IV Piggyback:700] Out: 650 [Urine:650] Intake/Output this shift:    General appearance: alert and no distress GI: soft, non-tender; bowel sounds normal; no masses,  no organomegaly  Lab Results:  Basename 02/02/12 0500 01/31/12 0510 01/30/12 1115  WBC 7.0 8.7 11.5*  HGB 12.9* 13.6 15.6  HCT 39.1 40.7 45.1  PLT 187 212 191   BMET  Basename 01/31/12 0510 01/30/12 1115  NA 135 133*  K 3.8 3.9  CL 102 100  CO2 25 21  GLUCOSE 93 125*  BUN 8 9  CREATININE 1.15 0.91  CALCIUM 8.3* 9.0   LFT  Basename 01/31/12 0510  PROT 5.5*  ALBUMIN 2.7*  AST 123*  ALT 107*  ALKPHOS 258*  BILITOT 6.2*  BILIDIR --  IBILI --   PT/INR  Basename 01/31/12 0510  LABPROT 13.9  INR 1.05   Hepatitis Panel No results found for this basename: HEPBSAG,HCVAB,HEPAIGM,HEPBIGM in the last 72 hours C-Diff No results found for this basename: CDIFFTOX:3 in the last 72 hours Fecal Lactopherrin No results found for this basename: FECLLACTOFRN in the last 72 hours  Studies/Results: Dg Ercp With Sphincterotomy  02/01/2012  *RADIOLOGY REPORT*  Clinical Data: Common bile duct stones.  Removal and sphincterotomy.  ERCP  Comparison:  CTs including 01/30/2012.  Technique:  Multiple spot images obtained with the fluoroscopic device and submitted for interpretation post-procedure.  ERCP was performed by Dr. Elnoria Howard.  Findings: A total of 4 images are submitted.  They  demonstrate contrast injection into the common duct.  Balloon retrieval of common duct stones.  No filling defects identified on final image.  IMPRESSION: Balloon retrieval of choledocholithiasis.  These images were submitted for radiologic interpretation only. Please see the procedural report for the amount of contrast and the fluoroscopy time utilized.  Original Report Authenticated By: Consuello Bossier, M.D.    Medications:  Scheduled:   . ciprofloxacin  500 mg Oral BID  . LORazepam      . LORazepam  1 mg Intravenous Once  . metoprolol  12.5 mg Oral BID  . metroNIDAZOLE  500 mg Oral Q8H  . olmesartan  20 mg Oral Daily  . pantoprazole (PROTONIX) IV  40 mg Intravenous Q12H  . DISCONTD: ciprofloxacin  400 mg Intravenous Q12H  . DISCONTD: metronidazole  500 mg Intravenous Q8H   Continuous:   . sodium chloride 150 mL/hr at 02/02/12 1610    Assessment/Plan: 1) Choledocholithiasis s/p ERCP with stone extraction.  Plan: 1) Advance diet. 2) Okay to D/C home. 3) Follow up in one month.  LOS: 3 days   Maridee Slape D 02/02/2012, 10:40 AM

## 2012-02-02 NOTE — Discharge Summary (Signed)
Discharge Summary  [name] MR#: [id number]  DOB:[birth year]  Date of Admission: 01/30/2012 Date of Discharge: 02/02/2012  Patient's PCP: Marga Melnick, MD  Attending Physician:Layia Walla  Consults:   GI- Dr. Elnoria Howard  Discharge Diagnoses: Active Problems:  Nausea & vomiting  Cholelithiasis   Brief Admitting History and Physical 78 yo male with hx of ccy circa 2008, presents with c/o abdominal epigastric, sharp, and achy, Constant since awaking this am. He has not been eating today. Slight chills, denies fever, n/v, diarrhea, brbpr, black stool. He does note slight constipation.  In ED, lft abnormal and found to have choledocholithiasis on CT scan and also lipase elevation. Pt will be admitted for choledocolithiasis, ? Ascending cholangitis and pancreatitis.    Discharge Medications Medication List  As of 02/02/2012  3:11 PM   TAKE these medications         acetaminophen 325 MG tablet   Commonly known as: TYLENOL   Take 650 mg by mouth every 6 (six) hours as needed. For pain      ciprofloxacin 500 MG tablet   Commonly known as: CIPRO   Take 1 tablet (500 mg total) by mouth 2 (two) times daily.      metoprolol 50 MG tablet   Commonly known as: LOPRESSOR   TAKE ONE-HALF TABLET BY MOUTH TWICE DAILY      metroNIDAZOLE 500 MG tablet   Commonly known as: FLAGYL   Take 1 tablet (500 mg total) by mouth every 8 (eight) hours.      omeprazole 20 MG capsule   Commonly known as: PRILOSEC   Take 20 mg by mouth daily.      valsartan 320 MG tablet   Commonly known as: DIOVAN   Take 160 mg by mouth daily.            Hospital Course: Abdominal pain sec to choledocholithiasis:  s/o ERCP: ENDOSCOPIC IMPRESSION:  1) Stones, multiple  2) Periampullary diverticula   Leukocytosis- resolved 7 days course of cipro/flagyl   Day of Discharge BP 106/63  Pulse 62  Temp 97.9 F (36.6 C) (Oral)  Resp 18  Ht 5\' 9"  (1.753 m)  Wt 88.3 kg (194 lb 10.7 oz)  BMI 28.75 kg/m2   SpO2 95% A+O x3 NAD -c/c/e +BS, soft, NT/ND  Results for orders placed during the hospital encounter of 01/30/12 (from the past 48 hour(s))  CBC     Status: Abnormal   Collection Time   02/02/12  5:00 AM      Component Value Range Comment   WBC 7.0  4.0 - 10.5 K/uL    RBC 4.04 (*) 4.22 - 5.81 MIL/uL    Hemoglobin 12.9 (*) 13.0 - 17.0 g/dL    HCT 40.9  81.1 - 91.4 %    MCV 96.8  78.0 - 100.0 fL    MCH 31.9  26.0 - 34.0 pg    MCHC 33.0  30.0 - 36.0 g/dL    RDW 78.2  95.6 - 21.3 %    Platelets 187  150 - 400 K/uL     Ct Abdomen Pelvis W Contrast  01/30/2012  *RADIOLOGY REPORT*  Clinical Data: Abdominal pain, onset today.  History of cholecystectomy.  CT ABDOMEN AND PELVIS WITH CONTRAST  Technique:  Multidetector CT imaging of the abdomen and pelvis was performed following the standard protocol during bolus administration of intravenous contrast.  Contrast: OMNIPAQUE IOHEXOL 300 MG/ML  SOLN  Comparison: Acute abdominal series 01/30/2012.  Abdominal pelvic CT 08/01/2011.  Findings: There is stable scarring in both lung bases.  There is new mild biliary dilatation status post cholecystectomy.  The common bile duct measures up to 10 mm in diameter.  Most apparent on the reformatted images is a probable faintly calcified approximately 8 mm stone in the distal common bile duct (best seen on coronal image 47 and sagittal image 48). There is mild surrounding soft tissue swelling within the porta hepatis.  There is no evidence of pancreatic mass or pancreatic ductal dilatation.  There is a diverticulum of the second portion of the duodenum.  There are mildly prominent lymph nodes within the porta hepatis, likely reactive.  A vague area of increased density within the medial segment of the left hepatic lobe on image 13 is stable.  No new focal lesions are identified.  There is mild atrophy within the left hepatic lobe. The spleen and adrenal glands appear normal.  There are stable bilateral renal  cysts.  No enhancing renal masses are identified.  There is no hydronephrosis.  The bowel gas pattern is normal.  There are chronic diverticular changes of the sigmoid colon without surrounding inflammation. There is stable mild aneurysmal dilatation of the abdominal aorta. 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  Foley catheter has been removed.  There is mild chronic bladder wall thickening, likely secondary to BPH.  Prominent fat within both inguinal canals is stable.  There are stable degenerative changes of the spine associated with a scoliosis.  There are stable changes of chronic osteonecrosis involving the left femoral head. There is no subchondral collapse.  IMPRESSION:  1.  Choledocholithiasis with mild biliary dilatation and surrounding soft tissue stranding.  Superimposed early cholangitis cannot be excluded.  2.  Probable reactive nodes within the porta hepatis. 3.  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  Original Report Authenticated By: Gerrianne Scale, M.D.   Dg Ercp With Sphincterotomy  02/01/2012  *RADIOLOGY REPORT*  Clinical Data: Common bile duct stones.  Removal and sphincterotomy.  ERCP  Comparison:  CTs including 01/30/2012.  Technique:  Multiple spot images obtained with the fluoroscopic device and submitted for interpretation post-procedure.  ERCP was performed by Dr. Elnoria Howard.  Findings: A total of 4 images are submitted.  They demonstrate contrast injection into the common duct.  Balloon retrieval of common duct stones.  No filling defects identified on final image.  IMPRESSION: Balloon retrieval of choledocholithiasis.  These images were submitted for radiologic interpretation only. Please see the procedural report for the amount of contrast and the fluoroscopy time utilized.  Original Report Authenticated By: Consuello Bossier, M.D.   Dg Abd Acute W/chest  01/30/2012   *RADIOLOGY REPORT*  Clinical Data: Shortness of breath and constipation.  ACUTE ABDOMEN SERIES (ABDOMEN 2 VIEW & CHEST 1 VIEW)  Comparison: Acute abdominal series 08/04/2011 and abdominal radiograph 08/05/2011.  CT abdomen pelvis 08/01/2011.  Findings: Heart, mediastinal, hilar contours within normal limits. Minimal left basilar atelectasis.  Otherwise, the lungs are clear. No free intraperitoneal air.  Gas is seen scattered throughout the colon, and within some upper quadrant and small bowel loops.  A small bowel loops measure up to 2.2 cm in caliber, within normal limits.  Rectal gas is seen. There is some scattered stool in the proximal colon.  No significant stool in the distal colon or rectum. No acute bony abnormality identified.  IMPRESSION: Nonobstructive bowel gas  pattern.  No significant stool burden identified.  Original Report Authenticated By: Britta Mccreedy, M.D.     Disposition: home  Diet: advance to GI soft and then cardiac diet  Activity: as tolerated   Follow-up Appts: Dr. Elnoria Howard- 1 month  Discharge Orders    Future Orders Please Complete By Expires   Diet - low sodium heart healthy      Increase activity slowly      Discharge instructions      Comments:   Advance diet slowly       Time spent on discharge, talking to the patient, and coordinating care: 45 mins.   SignedMarlin Canary, DO 02/02/2012, 3:11 PM

## 2012-02-20 ENCOUNTER — Ambulatory Visit: Payer: Medicare Other | Admitting: Internal Medicine

## 2012-02-27 ENCOUNTER — Encounter: Payer: Self-pay | Admitting: Internal Medicine

## 2012-02-27 ENCOUNTER — Ambulatory Visit (INDEPENDENT_AMBULATORY_CARE_PROVIDER_SITE_OTHER): Payer: Medicare Other | Admitting: Internal Medicine

## 2012-02-27 VITALS — BP 130/78 | HR 72 | Temp 98.6°F | Wt 184.0 lb

## 2012-02-27 DIAGNOSIS — D649 Anemia, unspecified: Secondary | ICD-10-CM

## 2012-02-27 DIAGNOSIS — Z9189 Other specified personal risk factors, not elsewhere classified: Secondary | ICD-10-CM

## 2012-02-27 DIAGNOSIS — Z87898 Personal history of other specified conditions: Secondary | ICD-10-CM | POA: Insufficient documentation

## 2012-02-27 NOTE — Patient Instructions (Signed)
Please try to go on My Chart within the next 24 hours to allow me to release the results directly to you.  

## 2012-02-27 NOTE — Assessment & Plan Note (Signed)
I have asked him to define which agents have been used if at all possible.

## 2012-02-27 NOTE — Progress Notes (Signed)
  Subjective:    Patient ID: [name], male    DOB: 1925-08-29, 78 y.o.   MRN: [id number]  HPI Endoscopic ERCP for retained gallstones was completed 02/01/12. This was complicated by anesthesia reaction manifested as incomplete sedation and combative behavior. As a similar history with anesthetic used at the time of his cholecystectomy. After that procedure he initially had coffee ground stools; this has essentially resolved. Liver enzymes were elevated on 01/31/12 as expected. Alkaline phosphatase was 258, albumin 2.7, AST 123, and ALT 107. Postoperatively there was mild anemia with hematocrit of 39.1.   Review of Systems At this time he denies abdominal pain, melena,or  rectal bleeding. Also he is not having fever, chills, or sweats.     Objective:   Physical Exam General appearance is one of good health and nourishment w/o distress.Appears younger than stated age   Eyes: No conjunctival inflammation or scleral icterus is present. Arcus present  Oral exam: Dental hygiene is good; lips and gums are healthy appearing.There is no oropharyngeal erythema or exudate noted. Small osteoma of hard palate  Heart:  Normal rate and regular rhythm. S1 and S2 normal without gallop, murmur, click, rub or other extra sounds     Lungs:Chest clear to auscultation; no wheezes, rhonchi,rales ,or rubs present.No increased work of breathing.   Abdomen: bowel sounds hyperactive but abdomen  soft and non-tender without masses, organomegaly or hernias noted.  No guarding or rebound   Skin:Warm & dry.  Intact without suspicious lesions or rashes ; no jaundice or tenting  Lymphatic: No lymphadenopathy is noted about the head, neck, axilla areas.              Assessment & Plan:  #1 status post ERCP with successful removal of retained gallstones  #2 possible melena post procedure; resolved by history  #3 significantly elevated hepatic function studies preop  #4 minimal postoperative anemia Plan:  See orders and recommendations

## 2012-03-04 ENCOUNTER — Other Ambulatory Visit: Payer: Self-pay | Admitting: Gastroenterology

## 2012-03-04 DIAGNOSIS — R932 Abnormal findings on diagnostic imaging of liver and biliary tract: Secondary | ICD-10-CM

## 2012-03-04 DIAGNOSIS — K8050 Calculus of bile duct without cholangitis or cholecystitis without obstruction: Secondary | ICD-10-CM

## 2012-03-07 ENCOUNTER — Ambulatory Visit
Admission: RE | Admit: 2012-03-07 | Discharge: 2012-03-07 | Disposition: A | Discharge status: Home / Self Care | Payer: Medicare Other | Source: Ambulatory Visit | Attending: Gastroenterology | Admitting: Gastroenterology

## 2012-03-07 DIAGNOSIS — K8050 Calculus of bile duct without cholangitis or cholecystitis without obstruction: Secondary | ICD-10-CM

## 2012-03-07 DIAGNOSIS — R932 Abnormal findings on diagnostic imaging of liver and biliary tract: Secondary | ICD-10-CM

## 2012-03-07 MED ORDER — IOHEXOL 300 MG/ML  SOLN
100.0000 mL | Freq: Once | INTRAMUSCULAR | Status: AC | PRN
  Administered 2012-03-07: 100 mL via INTRAVENOUS

## 2012-03-11 ENCOUNTER — Other Ambulatory Visit: Payer: Self-pay | Admitting: Internal Medicine

## 2012-05-08 ENCOUNTER — Encounter: Payer: Self-pay | Admitting: Gastroenterology

## 2012-05-30 ENCOUNTER — Other Ambulatory Visit: Payer: Self-pay | Admitting: Internal Medicine

## 2012-07-08 ENCOUNTER — Other Ambulatory Visit: Payer: Self-pay | Admitting: Internal Medicine

## 2012-07-09 NOTE — Telephone Encounter (Signed)
Rx sent.    MW 

## 2012-09-30 ENCOUNTER — Other Ambulatory Visit: Payer: Self-pay | Admitting: Internal Medicine

## 2012-11-24 ENCOUNTER — Other Ambulatory Visit: Payer: Self-pay | Admitting: Internal Medicine

## 2012-12-17 ENCOUNTER — Other Ambulatory Visit: Payer: Self-pay | Admitting: Urology

## 2012-12-24 ENCOUNTER — Encounter (HOSPITAL_BASED_OUTPATIENT_CLINIC_OR_DEPARTMENT_OTHER): Payer: Self-pay | Admitting: *Deleted

## 2012-12-24 NOTE — Progress Notes (Signed)
NPO AFTER MN. ARRIVES AT 1030. NEEDS ISTAT AND EKG. WILL TAKE METOPROLOL AND PRILOSEC AM OF SURG W/ SIP OF WATER.

## 2012-12-25 NOTE — H&P (Signed)
ctive Problems Problems  1. History of  Malignant Neoplasm Of The Lateral Wall Of The Bladder V10.51 2. Nocturia 788.43 3. History of  Prostatitis 601.9 4. Urethral Stricture 598.9  History of Present Illness  [name] returns today for f/u of his history of bladder cancer.  He had  TURBT in 12/12.  He was found to have extensive TCCa that involved the  prostatic urethra and the right side of the bladder.   The path showed high grade superficial disease.  He declined cystectomy and was given an induction course of BCG followed by maintenance therapy. He was just given a single dose of maintenance BCG in January because of the irritation it causes.  His last cytology in december had some Atypia with suspicious cells but the FISH was negative.   He has nocturia but it is down to about 1x.    He has had no hematuria.  He is having no pain or leakage.   Past Medical History Problems  1. History of  Esophageal Reflux 530.81 2. History of  Feelings Of Urinary Urgency 788.63 3. History of  Hypercholesterolemia 272.0 4. History of  Hypertension 401.9 5. History of  Malignant Neoplasm Of The Lateral Wall Of The Bladder V10.51 6. History of  Malignant Neoplasm Of The Neck Of The Bladder V10.51 7. History of  Malignant Neoplasm Of The Posterior Wall Of The Bladder V10.51 8. History of  Prostatitis 601.9 9. History of  Pyuria 791.9 10. History of  Urge Incontinence Of Urine 788.31 11. History of  Urinary Tract Infection V13.02 12. History of  Vertigo 780.4  Surgical History Problems  1. History of  Cholecystectomy 2. History of  Cystoscopy With Fulguration Large Lesion (Over 5cm)  Current Meds 1. Diovan CAPS; Therapy: (Recorded:28Apr2011) to 2. Docusate Sodium 100 MG Oral Tablet; Therapy: (Recorded:31Dec2012) to 3. Metoprolol Succinate ER 50 MG Oral Tablet Extended Release 24 Hour; Therapy: 07Apr2014 to 4. Omeprazole TBEC; Therapy: (Recorded:31Dec2012) to 5. Tylenol TABS; Therapy:  (Recorded:30Aug2011) to  Allergies Medication  1. Cephalexin CAPS  Family History Problems  1. Maternal history of  Colon Cancer V16.0 2. Family history of  Death In The Family Father deceased age 93--heart 3. Family history of  Death In The Family Mother deceased age 13--colon cancer 4. Maternal history of  Family Health Status Number Of Children 1 son 2 daughters  Social History Problems  1. Marital History - Currently Married 2. Retired From Work 3. Tobacco Use V15.82 smoked for 38yrs quit 30 yrs ago Denied  4. History of  Alcohol Use 5. History of  Caffeine Use  Review of Systems Genitourinary, constitutional, skin, eye, otolaryngeal, hematologic/lymphatic, cardiovascular, pulmonary, endocrine, musculoskeletal, gastrointestinal, neurological and psychiatric system(s) were reviewed and pertinent findings if present are noted.    Vitals Vital Signs [Data Includes: Last 1 Day]  21Apr2014 02:26PM  Blood Pressure: 156 / 80 Temperature: 98.2 F Heart Rate: 78  Results/Data Urine [Data Includes: Last 1 Day]   21Apr2014  COLOR YELLOW   APPEARANCE CLEAR   SPECIFIC GRAVITY 1.015   pH 6.0   GLUCOSE NEG mg/dL  BILIRUBIN NEG   KETONE NEG mg/dL  BLOOD NEG   PROTEIN NEG mg/dL  UROBILINOGEN 0.2 mg/dL  NITRITE NEG   LEUKOCYTE ESTERASE NEG    Procedure  Procedure: Cystoscopy   Indication: History of Urothelial Carcinoma.  Informed Consent: Risks, benefits, and potential adverse events were discussed and informed consent was obtained from the patient.  Prep: The patient was prepped with  betadine.  Procedure Note:  Urethral meatus:. No abnormalities.  Anterior urethra:. A mild stricture was present in the bulbar urethra measuring 0.1 cm, and was dilated.  Prostatic urethra:. Estimated length was 2 cm. The lateral prostatic lobes were enlarged. (with some evidence of prior resection proximally).  Bladder: Visulization was clear. The ureteral orifices were in the normal  anatomic position bilaterally and had clear efflux of urine. A systematic survey of the bladder demonstrated no bladder tumors or stones. Examination of the bladder demonstrated erythematous mucosa (right lateral wall and anterior bladder neck consistent with treatment effect. ). There is a scar on the right lateral wall. He had a full dose BCG instillation post cystoscopy with a coude catheter. The patient tolerated the procedure well.  Complications: None.    Assessment Assessed  1. History of  Malignant Neoplasm Of The Lateral Wall Of The Bladder V10.51 2. Urethral Stricture 598.9   He is doing well without recurrent disease.  he has some treatment effect on the right bladder wall.  He has a bulbar stricture that I dilated.   Plan  Health Maintenance (V70.0)  1. UA With REFLEX  Done: 21Apr2014 02:07PM PMH: Malignant Neoplasm Of The Lateral Wall Of The Bladder (V10.51)  2. Follow-up Month x 3 Office  Follow-up  Requested for: 21Apr2014 3. BCG  Done: 21Apr2014 4. Cysto  Requested for: 21Apr2014   He will return in 3 months for cystoscopy and cytology with maintenance BCG.  He will only be given the single treatment each cycle.   He had a cytology and FISH that were positive so he is to undergo cystoscopy and biopsy with bilateral retrogrades.

## 2012-12-26 ENCOUNTER — Ambulatory Visit (HOSPITAL_BASED_OUTPATIENT_CLINIC_OR_DEPARTMENT_OTHER)
Admission: RE | Admit: 2012-12-26 | Discharge: 2012-12-26 | Disposition: A | Discharge status: Home / Self Care | Payer: Medicare Other | Source: Ambulatory Visit | Attending: Urology | Admitting: Urology

## 2012-12-26 ENCOUNTER — Encounter (HOSPITAL_BASED_OUTPATIENT_CLINIC_OR_DEPARTMENT_OTHER)
Admission: RE | Disposition: A | Discharge status: Home / Self Care | Payer: Self-pay | Source: Ambulatory Visit | Attending: Urology

## 2012-12-26 ENCOUNTER — Ambulatory Visit (HOSPITAL_BASED_OUTPATIENT_CLINIC_OR_DEPARTMENT_OTHER): Payer: Medicare Other | Admitting: Anesthesiology

## 2012-12-26 ENCOUNTER — Encounter (HOSPITAL_BASED_OUTPATIENT_CLINIC_OR_DEPARTMENT_OTHER): Payer: Self-pay | Admitting: Anesthesiology

## 2012-12-26 ENCOUNTER — Other Ambulatory Visit: Payer: Self-pay

## 2012-12-26 DIAGNOSIS — R351 Nocturia: Secondary | ICD-10-CM | POA: Insufficient documentation

## 2012-12-26 DIAGNOSIS — Z79899 Other long term (current) drug therapy: Secondary | ICD-10-CM | POA: Insufficient documentation

## 2012-12-26 DIAGNOSIS — R42 Dizziness and giddiness: Secondary | ICD-10-CM | POA: Insufficient documentation

## 2012-12-26 DIAGNOSIS — Z8551 Personal history of malignant neoplasm of bladder: Secondary | ICD-10-CM | POA: Insufficient documentation

## 2012-12-26 DIAGNOSIS — N3080 Other cystitis without hematuria: Secondary | ICD-10-CM | POA: Insufficient documentation

## 2012-12-26 DIAGNOSIS — K219 Gastro-esophageal reflux disease without esophagitis: Secondary | ICD-10-CM | POA: Insufficient documentation

## 2012-12-26 DIAGNOSIS — Z800 Family history of malignant neoplasm of digestive organs: Secondary | ICD-10-CM | POA: Insufficient documentation

## 2012-12-26 DIAGNOSIS — Z87440 Personal history of urinary (tract) infections: Secondary | ICD-10-CM | POA: Insufficient documentation

## 2012-12-26 DIAGNOSIS — N35919 Unspecified urethral stricture, male, unspecified site: Secondary | ICD-10-CM | POA: Insufficient documentation

## 2012-12-26 DIAGNOSIS — E7800 Pure hypercholesterolemia, unspecified: Secondary | ICD-10-CM | POA: Insufficient documentation

## 2012-12-26 DIAGNOSIS — I10 Essential (primary) hypertension: Secondary | ICD-10-CM | POA: Insufficient documentation

## 2012-12-26 HISTORY — DX: Personal history of malignant neoplasm of bladder: Z85.51

## 2012-12-26 HISTORY — PX: CYSTOSCOPY W/ RETROGRADES: SHX1426

## 2012-12-26 HISTORY — PX: FULGURATION OF BLADDER TUMOR: SHX6261

## 2012-12-26 HISTORY — DX: Gastro-esophageal reflux disease without esophagitis: K21.9

## 2012-12-26 HISTORY — DX: Personal history of other infectious and parasitic diseases: Z86.19

## 2012-12-26 HISTORY — DX: Essential tremor: G25.0

## 2012-12-26 HISTORY — DX: Personal history of other diseases of male genital organs: Z87.438

## 2012-12-26 HISTORY — DX: Aneurysm of iliac artery: I72.3

## 2012-12-26 HISTORY — DX: Other amnesia: R41.3

## 2012-12-26 HISTORY — DX: Weakness: R53.1

## 2012-12-26 LAB — POCT I-STAT 4, (NA,K, GLUC, HGB,HCT)
Glucose, Bld: 125 mg/dL — ABNORMAL HIGH (ref 70–99)
HCT: 51 % (ref 39.0–52.0)
Potassium: 4.1 mEq/L (ref 3.5–5.1)
Sodium: 139 mEq/L (ref 135–145)

## 2012-12-26 SURGERY — CYSTOSCOPY, WITH RETROGRADE PYELOGRAM
Anesthesia: General | Site: Ureter | Wound class: Clean Contaminated

## 2012-12-26 MED ORDER — PROPOFOL 10 MG/ML IV BOLUS
INTRAVENOUS | Status: DC | PRN
  Administered 2012-12-26: 40 mg via INTRAVENOUS
  Administered 2012-12-26: 160 mg via INTRAVENOUS

## 2012-12-26 MED ORDER — IOHEXOL 350 MG/ML SOLN
INTRAVENOUS | Status: DC | PRN
  Administered 2012-12-26: 15 mL

## 2012-12-26 MED ORDER — LACTATED RINGERS IV SOLN
INTRAVENOUS | Status: DC
  Administered 2012-12-26 (×3): via INTRAVENOUS
  Filled 2012-12-26: qty 1000

## 2012-12-26 MED ORDER — ONDANSETRON HCL 4 MG/2ML IJ SOLN
INTRAMUSCULAR | Status: DC | PRN
  Administered 2012-12-26: 4 mg via INTRAVENOUS

## 2012-12-26 MED ORDER — STERILE WATER FOR IRRIGATION IR SOLN
Status: DC | PRN
  Administered 2012-12-26: 3000 mL via INTRAVESICAL

## 2012-12-26 MED ORDER — BELLADONNA ALKALOIDS-OPIUM 16.2-60 MG RE SUPP
RECTAL | Status: DC | PRN
  Administered 2012-12-26: 1 via RECTAL

## 2012-12-26 MED ORDER — FENTANYL CITRATE 0.05 MG/ML IJ SOLN
INTRAMUSCULAR | Status: DC | PRN
  Administered 2012-12-26 (×3): 25 ug via INTRAVENOUS

## 2012-12-26 MED ORDER — HYDROCODONE-ACETAMINOPHEN 5-325 MG PO TABS: 1.0000 | ORAL_TABLET | Freq: Four times a day (QID) | ORAL | Status: DC | PRN

## 2012-12-26 MED ORDER — LIDOCAINE HCL (CARDIAC) 20 MG/ML IV SOLN
INTRAVENOUS | Status: DC | PRN
  Administered 2012-12-26: 50 mg via INTRAVENOUS

## 2012-12-26 MED ORDER — CIPROFLOXACIN IN D5W 400 MG/200ML IV SOLN
400.0000 mg | INTRAVENOUS | Status: AC
  Administered 2012-12-26: 400 mg via INTRAVENOUS
  Filled 2012-12-26: qty 200

## 2012-12-26 MED ORDER — FENTANYL CITRATE 0.05 MG/ML IJ SOLN
25.0000 ug | INTRAMUSCULAR | Status: DC | PRN
  Administered 2012-12-26 (×2): 25 ug via INTRAVENOUS
  Filled 2012-12-26: qty 1

## 2012-12-26 MED ORDER — LACTATED RINGERS IV SOLN
INTRAVENOUS | Status: DC
  Filled 2012-12-26: qty 1000

## 2012-12-26 SURGICAL SUPPLY — 39 items
BAG DRAIN URO-CYSTO SKYTR STRL (DRAIN) ×3 IMPLANT
BAG DRN ANRFLXCHMBR STRAP LEK (BAG) ×2
BAG DRN UROCATH (DRAIN) ×2
BAG URINE LEG 19OZ MD ST LTX (BAG) ×1 IMPLANT
BASKET LASER NITINOL 1.9FR (BASKET) IMPLANT
BASKET SEGURA 3FR (UROLOGICAL SUPPLIES) IMPLANT
BASKET STNLS GEMINI 4WIRE 3FR (BASKET) IMPLANT
BASKET ZERO TIP NITINOL 2.4FR (BASKET) IMPLANT
BRUSH URET BIOPSY 3F (UROLOGICAL SUPPLIES) IMPLANT
BSKT STON RTRVL 120 1.9FR (BASKET)
BSKT STON RTRVL GEM 120X11 3FR (BASKET)
BSKT STON RTRVL ZERO TP 2.4FR (BASKET)
CANISTER SUCT LVC 12 LTR MEDI- (MISCELLANEOUS) ×1 IMPLANT
CATH FOLEY 2WAY SLVR  5CC 18FR (CATHETERS) ×1
CATH FOLEY 2WAY SLVR 5CC 18FR (CATHETERS) IMPLANT
CATH URET 5FR 28IN CONE TIP (BALLOONS)
CATH URET 5FR 28IN OPEN ENDED (CATHETERS) ×1 IMPLANT
CATH URET 5FR 70CM CONE TIP (BALLOONS) IMPLANT
CLOTH BEACON ORANGE TIMEOUT ST (SAFETY) ×3 IMPLANT
DRAPE CAMERA CLOSED 9X96 (DRAPES) ×3 IMPLANT
ELECT REM PT RETURN 9FT ADLT (ELECTROSURGICAL) ×3
ELECTRODE REM PT RTRN 9FT ADLT (ELECTROSURGICAL) IMPLANT
GLOVE BIO SURGEON STRL SZ7 (GLOVE) ×1 IMPLANT
GLOVE ECLIPSE 6.5 STRL STRAW (GLOVE) ×1 IMPLANT
GLOVE SURG SS PI 8.0 STRL IVOR (GLOVE) ×3 IMPLANT
GOWN STRL REIN XL XLG (GOWN DISPOSABLE) ×3 IMPLANT
GOWN XL W/COTTON TOWEL STD (GOWNS) ×3 IMPLANT
GUIDEWIRE 0.038 PTFE COATED (WIRE) IMPLANT
GUIDEWIRE ANG ZIPWIRE 038X150 (WIRE) IMPLANT
GUIDEWIRE STR DUAL SENSOR (WIRE) ×3 IMPLANT
KIT BALLIN UROMAX 15FX10 (LABEL) IMPLANT
KIT BALLN UROMAX 15FX4 (MISCELLANEOUS) IMPLANT
KIT BALLN UROMAX 26 75X4 (MISCELLANEOUS)
LASER FIBER DISP (UROLOGICAL SUPPLIES) IMPLANT
LASER FIBER DISP 1000U (UROLOGICAL SUPPLIES) IMPLANT
PACK CYSTOSCOPY (CUSTOM PROCEDURE TRAY) ×3 IMPLANT
SET HIGH PRES BAL DIL (LABEL)
SHEATH URET ACCESS 12FR/35CM (UROLOGICAL SUPPLIES) IMPLANT
SHEATH URET ACCESS 12FR/55CM (UROLOGICAL SUPPLIES) IMPLANT

## 2012-12-26 NOTE — Transfer of Care (Signed)
Immediate Anesthesia Transfer of Care Note  Patient: [name]  Procedure(s) Performed: Procedure(s) (LRB): CYSTOSCOPY WITH Bilateral RETROGRADE PYELOGRAM (Bilateral) Multiple Bladder Biopsy (N/A)  Patient Location: PACU  Anesthesia Type: General  Level of Consciousness: awake, oriented, sedated and patient cooperative  Airway & Oxygen Therapy: Patient Spontanous Breathing and Patient connected to face mask oxygen  Post-op Assessment: Report given to PACU RN and Post -op Vital signs reviewed and stable  Post vital signs: Reviewed and stable  Complications: No apparent anesthesia complications

## 2012-12-26 NOTE — Brief Op Note (Signed)
12/26/2012  12:00 PM  PATIENT:  Deri Fuelling  79 y.o. male  PRE-OPERATIVE DIAGNOSIS:  History of Bladder Cancer with positive cytology  POST-OPERATIVE DIAGNOSIS:  Same  PROCEDURE:  Procedure(s) with comments: CYSTOSCOPY WITH RETROGRADE PYELOGRAM (Bilateral) - BLADDER BIOPSY x 6 with  FULGURATION   SURGEON:  Surgeon(s) and Role:    * Anner Crete, MD - Primary  PHYSICIAN ASSISTANT:   ASSISTANTS: none   ANESTHESIA:   general  EBL:  Total I/O In: 100 [I.V.:100] Out: -   BLOOD ADMINISTERED:none  DRAINS: Urinary Catheter (Foley)   LOCAL MEDICATIONS USED:  NONE  SPECIMEN:  Source of Specimen:  biopsies from bladder and prostatic urethra  DISPOSITION OF SPECIMEN:  PATHOLOGY  COUNTS:  YES  TOURNIQUET:  * No tourniquets in log *  DICTATION: .Other Dictation: Dictation Number [id number]?  PLAN OF CARE: Discharge to home after PACU  PATIENT DISPOSITION:  PACU - hemodynamically stable.   Delay start of Pharmacological VTE agent (>24hrs) due to surgical blood loss or risk of bleeding: yes

## 2012-12-26 NOTE — Anesthesia Procedure Notes (Signed)
Procedure Name: LMA Insertion Date/Time: 12/26/2012 11:27 AM Performed by: Fran Lowes Pre-anesthesia Checklist: Patient identified, Emergency Drugs available, Suction available and Patient being monitored Patient Re-evaluated:Patient Re-evaluated prior to inductionOxygen Delivery Method: Circle System Utilized Preoxygenation: Pre-oxygenation with 100% oxygen Intubation Type: IV induction Ventilation: Mask ventilation without difficulty LMA: LMA inserted LMA Size: 4.0 Number of attempts: 1 Airway Equipment and Method: bite block Placement Confirmation: positive ETCO2 Tube secured with: Tape Dental Injury: Teeth and Oropharynx as per pre-operative assessment

## 2012-12-26 NOTE — Anesthesia Preprocedure Evaluation (Addendum)
Anesthesia Evaluation  Patient identified by MRN, date of birth, ID band Patient awake    Reviewed: Allergy & Precautions, H&P , NPO status , Patient's Chart, lab work & pertinent test results, reviewed documented beta blocker date and time   History of Anesthesia Complications (+) Emergence Delirium  Airway Mallampati: II TM Distance: >3 FB Neck ROM: full    Dental no notable dental hx. (+) Teeth Intact and Dental Advisory Given   Pulmonary neg pulmonary ROS,  breath sounds clear to auscultation  Pulmonary exam normal       Cardiovascular Exercise Tolerance: Poor hypertension, Pt. on home beta blockers and Pt. on medications + Peripheral Vascular Disease Rhythm:regular Rate:Normal  Palpitations. Iliac artery aneurysm stable.   Neuro/Psych Carotid stenosis negative neurological ROS  negative psych ROS   GI/Hepatic negative GI ROS, Neg liver ROS, GERD-  Medicated and Controlled,  Endo/Other  negative endocrine ROS  Renal/GU negative Renal ROS  negative genitourinary   Musculoskeletal   Abdominal   Peds  Hematology negative hematology ROS (+)   Anesthesia Other Findings Generalized weakness  Reproductive/Obstetrics negative OB ROS                          Anesthesia Physical Anesthesia Plan  ASA: III  Anesthesia Plan: General   Post-op Pain Management:    Induction: Intravenous  Airway Management Planned: LMA  Additional Equipment:   Intra-op Plan:   Post-operative Plan:   Informed Consent: I have reviewed the patients History and Physical, chart, labs and discussed the procedure including the risks, benefits and alternatives for the proposed anesthesia with the patient or authorized representative who has indicated his/her understanding and acceptance.   Dental Advisory Given  Plan Discussed with: CRNA and Surgeon  Anesthesia Plan Comments:         Anesthesia Quick  Evaluation

## 2012-12-26 NOTE — Interval H&P Note (Signed)
History and Physical Interval Note:  12/26/2012 11:12 AM  [name]  has presented today for surgery, with the diagnosis of History of Bladder Cancer  The various methods of treatment have been discussed with the patient and family. After consideration of risks, benefits and other options for treatment, the patient has consented to  Procedure(s) with comments: CYSTOSCOPY WITH RETROGRADE PYELOGRAM (Bilateral) - BLADDER BIOPSY FULGURATION OF BLADDER TUMOR (N/A) as a surgical intervention .  The patient's history has been reviewed, patient examined, no change in status, stable for surgery.  I have reviewed the patient's chart and labs.  Questions were answered to the patient's satisfaction.     Adrina Armijo J

## 2012-12-26 NOTE — Anesthesia Postprocedure Evaluation (Signed)
  Anesthesia Post-op Note  Patient: [name]  Procedure(s) Performed: Procedure(s) (LRB): CYSTOSCOPY WITH Bilateral RETROGRADE PYELOGRAM (Bilateral) Multiple Bladder Biopsy (N/A)  Patient Location: PACU  Anesthesia Type: General  Level of Consciousness: awake and alert   Airway and Oxygen Therapy: Patient Spontanous Breathing  Post-op Pain: mild  Post-op Assessment: Post-op Vital signs reviewed, Patient's Cardiovascular Status Stable, Respiratory Function Stable, Patent Airway and No signs of Nausea or vomiting  Last Vitals:  Filed Vitals:   12/26/12 1215  BP: 137/71  Pulse: 86  Temp:   Resp: 12    Post-op Vital Signs: stable   Complications: No apparent anesthesia complications

## 2012-12-27 ENCOUNTER — Encounter (HOSPITAL_BASED_OUTPATIENT_CLINIC_OR_DEPARTMENT_OTHER): Payer: Self-pay | Admitting: Urology

## 2012-12-27 NOTE — Op Note (Addendum)
[name], [name]               ACCOUNT NO.:  [id number]  MEDICAL RECORD NO.:  [id number]  LOCATION:                                FACILITY:  [facility]  PHYSICIAN:  Excell Seltzer. Annabell Howells, M.D.    DATE OF BIRTH:  1925/11/29  DATE OF PROCEDURE:  12/26/2012 DATE OF DISCHARGE:                              OPERATIVE REPORT   PROCEDURE:  Cystoscopy, bladder and prostatic urethral biopsies with fulguration, total 6 sites biopsied.  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  SURGEON:  Excell Seltzer. Annabell Howells, M.D.  ANESTHESIA:  General.  SPECIMEN:  Biopsies from the trigone, right lateral wall, left lateral wall, posterior wall, and dome of the bladder and a biopsy from the prostatic urethra.  ESTIMATED BLOOD LOSS:  Minimal.  DRAINS:  An 18-French Foley catheter.  COMPLICATIONS:  None.  INDICATIONS:  [name] is an 79 year old white male with a history of bladder cancer involving the right side floor of the bladder along with the prostatic urethra, who had prior resection followed by BCG treatment on recent surveillance cystoscopy, and was found to have some residual erythema in the bladder that was initially felt to be treatment effect, but his cytology returned with atypia with a positive FISH.  It was felt that biopsy and upper tract studies are indicated.  FINDINGS OF PROCEDURE:  He was given Cipro and taken to the operating room where general anesthetic was induced.  He was placed in lithotomy position, and fitted with PAS hose.  His perineum and genitalia were prepped with Betadine solution.  He was draped in usual sterile fashion.  Cystoscopy was performed using a 22-French scope and 12 and 70 degree lenses.  Examination revealed a  normal urethra with intact external sphincter.  The prostatic urethra was approximately 3 cm in length with evidence of resection in the floor and posterolateral lobes with some increased erythema that was mostly consistent with neovascularity in the floor of the prostate.  No papillary lesions were seen.  The bladder was inspected.  The ureteral orifices were unremarkable. There was moderate trabeculation.  There was diffuse increased vascularity with follicular changes, but no papillary tumors were seen and no discrete lesions were seen other than stellate scars on the posterior wall and right lateral wall from his prior resections.  After initial cystoscopy, the retrograde pyelograms were performed.  The right ureter was cannulated with 5-French open-end catheter and contrast was instilled.  This demonstrated a normal ureter and intrarenal collecting system.  The left ureteral orifice was cannulated with 5-French open-end catheter and contrast was instilled.  This also demonstrated a normal ureter and intrarenal collecting system.  Once retrograde pyelograms were complete, cup biopsies were obtained from the trigone posterior wall dome, right lateral wall, left lateral wall of the bladder, as well as the prostatic urethra.  The biopsy sites were fulgurated with a Bugbee electrode after completion of the biopsies and once hemostasis was achieved, the bladder was drained.  An 18-French Foley catheter was inserted.  The balloon was filled with 10 mL of sterile fluid.  B and O suppository was placed.  He was taken down from lithotomy position.  His anesthetic was reversed.  He was moved to recovery in stable condition.  There were no complications.     Excell Seltzer. Annabell Howells, M.D.     JJW/MEDQ  D:  12/26/2012  T:  12/27/2012  Job:  [id number]

## 2013-01-17 ENCOUNTER — Encounter: Payer: Medicare Other | Admitting: Internal Medicine

## 2013-02-26 ENCOUNTER — Encounter: Payer: Self-pay | Admitting: Internal Medicine

## 2013-02-26 ENCOUNTER — Ambulatory Visit (INDEPENDENT_AMBULATORY_CARE_PROVIDER_SITE_OTHER): Payer: Medicare Other | Admitting: Internal Medicine

## 2013-02-26 VITALS — BP 118/76 | HR 73 | Temp 98.3°F | Resp 14 | Ht 67.5 in | Wt 198.0 lb

## 2013-02-26 DIAGNOSIS — R413 Other amnesia: Secondary | ICD-10-CM

## 2013-02-26 DIAGNOSIS — Z0000 Encounter for general adult medical examination without abnormal findings: Secondary | ICD-10-CM

## 2013-02-26 DIAGNOSIS — E785 Hyperlipidemia, unspecified: Secondary | ICD-10-CM

## 2013-02-26 DIAGNOSIS — D126 Benign neoplasm of colon, unspecified: Secondary | ICD-10-CM

## 2013-02-26 DIAGNOSIS — I10 Essential (primary) hypertension: Secondary | ICD-10-CM

## 2013-02-26 LAB — LIPID PANEL
Cholesterol: 198 mg/dL (ref 0–200)
LDL Cholesterol: 121 mg/dL — ABNORMAL HIGH (ref 0–99)
Triglycerides: 174 mg/dL — ABNORMAL HIGH (ref 0.0–149.0)
VLDL: 34.8 mg/dL (ref 0.0–40.0)

## 2013-02-26 LAB — BASIC METABOLIC PANEL
BUN: 10 mg/dL (ref 6–23)
CO2: 25 mEq/L (ref 19–32)
Chloride: 105 mEq/L (ref 96–112)
Glucose, Bld: 98 mg/dL (ref 70–99)
Potassium: 4 mEq/L (ref 3.5–5.1)

## 2013-02-26 LAB — HEPATIC FUNCTION PANEL
Albumin: 4.1 g/dL (ref 3.5–5.2)
Total Bilirubin: 0.8 mg/dL (ref 0.3–1.2)

## 2013-02-26 LAB — CBC WITH DIFFERENTIAL/PLATELET
Basophils Absolute: 0 10*3/uL (ref 0.0–0.1)
HCT: 50.3 % (ref 39.0–52.0)
Lymphs Abs: 4.2 10*3/uL — ABNORMAL HIGH (ref 0.7–4.0)
MCHC: 33.4 g/dL (ref 30.0–36.0)
MCV: 96.1 fl (ref 78.0–100.0)
Monocytes Absolute: 0.7 10*3/uL (ref 0.1–1.0)
Neutro Abs: 4.5 10*3/uL (ref 1.4–7.7)
Platelets: 226 10*3/uL (ref 150.0–400.0)
RDW: 14.3 % (ref 11.5–14.6)

## 2013-02-26 NOTE — Progress Notes (Signed)
Subjective:    Patient ID: [name], male    DOB: [birth year], 79 y.o.   MRN: [id number]  HPI  Medicare Wellness Visit:  Psychosocial & medical history were reviewed as required by Medicare (abuse,antisocial behavioral risks,firearm risk).  Social history: caffeine: 1 cup coffee/day , alcohol:very rare  ,  tobacco use: quit 1975   Exercise : see below  Home & personal  safety / fall risk:uses cane for stability Limitations of activities of daily living:no Seatbelt  and smoke alarm use:yes Power of Attorney/Living Will status : needed Ophthalmology exam status :> 1 year Hearing evaluation status:not current Orientation :oriented X 3 Memory & recall :0 of 3 Math testing:fair Active depression / anxiety:denied Foreign travel history : never Immunization status for Shingles /Flu/ PNA/ tetanus : ? ; flu current Transfusion history:  no Preventive health surveillance status of colonoscopy as per protocol/ SOC: not completed as recommended Dental care: as needed  Chart reviewed &  Updated. Active issues reviewed & addressed.      Review of Systems CHRONIC HYPERTENSION follow-up: Home blood pressure  average 105/60s Patient is compliant with medications No adverse effects noted from medication No exercise program  No specific dietary program; but no added salt  No chest pain, palpitations, dyspnea, claudication,edema or paroxysmal nocturnal dyspnea described No significant lightheadedness, headache, epistaxis, or syncope         Objective:   Physical Exam Gen.:  well-nourished in appearance. Alert, appropriate and cooperative throughout exam.Appears younger than stated age  Head: Normocephalic without obvious abnormalities;  pattern alopecia  Eyes: No corneal or conjunctival inflammation noted.  Extraocular motion intact. Vision grossly normal with lenses Ears: External  ear exam reveals no significant lesions or deformities. Canals clear .TMs normal. Hearing is grossly  slightly decreased bilaterally, L > R. Nose: External nasal exam reveals no deformity or inflammation. Nasal mucosa are pink and moist. No lesions or exudates noted.   Mouth: Oral mucosa and oropharynx reveal no lesions or exudates. Teeth in good repair. Neck: No deformities, masses, or tenderness noted. Range of motion decreased to L. Thyroid normal. Lungs: Normal respiratory effort; chest expands symmetrically. Lungs are clear to auscultation without rales, wheezes, or increased work of breathing. Heart: Normal rate and rhythm. Normal S1 and S2. No gallop, click, or rub.S4 w/o murmur. Abdomen: Bowel sounds normal; abdomen soft and nontender. No masses, organomegaly or hernias noted. Genitalia: Dr Annabell Howells                          Musculoskeletal/extremities: Accentuated curvature of  Thoracic spine. No clubbing, cyanosis, edema, or significant extremity  deformity noted. Range of motion normal .Tone & strength  Normal. Joints normal. Nail health good. Able to lie down & sit up w/o help. Negative SLR bilaterally Vascular: Carotid, radial artery, dorsalis pedis and  posterior tibial pulses are full and equal. No bruits present. Neurologic: Flat affect. Deep tendon reflexes symmetrical and normal.  Bilateral rest &   intention tremor of hands Gait broad & unsteady .        Skin: Intact without suspicious lesions or rashes. Myriad keratoses Lymph: No cervical, axillary lymphadenopathy present. Psych: Mood and affect are normal. Normally interactive  Assessment & Plan:  #1 Medicare Wellness Exam; criteria met ; data entered #2 Problem List/Diagnoses reviewed. Neurology referral for memory issues & tremor declined  Plan:  Assessments made/ Orders entered

## 2013-02-26 NOTE — Patient Instructions (Addendum)
Minimal Blood Pressure Goal= AVERAGE < 140/90;  Ideal is an AVERAGE < 135/85. This AVERAGE should be calculated from @ least 5-7 BP readings taken @ different times of day on different days of week. You should not respond to isolated BP readings , but rather the AVERAGE for that week .Please bring your  blood pressure cuff to office visits to verify that it is reliable.It  can also be checked against the blood pressure device at the pharmacy. Finger or wrist cuffs are not dependable; an arm cuff is.  Please complete and return stool cards; these will determine whether there is any gastrointestinal bleeding risk.

## 2013-02-27 ENCOUNTER — Encounter: Payer: Self-pay | Admitting: *Deleted

## 2013-03-31 ENCOUNTER — Other Ambulatory Visit: Payer: Self-pay | Admitting: Internal Medicine

## 2013-06-25 ENCOUNTER — Other Ambulatory Visit: Payer: Self-pay | Admitting: Internal Medicine

## 2013-06-25 NOTE — Telephone Encounter (Signed)
Metoprolol and Diovan refills sent to pharmacy

## 2013-11-06 ENCOUNTER — Other Ambulatory Visit: Payer: Self-pay

## 2013-11-06 ENCOUNTER — Emergency Department (HOSPITAL_COMMUNITY): Payer: Medicare Other

## 2013-11-06 ENCOUNTER — Encounter (HOSPITAL_COMMUNITY): Payer: Self-pay | Admitting: Emergency Medicine

## 2013-11-06 ENCOUNTER — Encounter (HOSPITAL_COMMUNITY)
Admission: EM | Disposition: A | Discharge status: Home Health | Payer: Medicare Other | Source: Home / Self Care | Attending: Cardiology

## 2013-11-06 ENCOUNTER — Inpatient Hospital Stay (HOSPITAL_COMMUNITY)
Admission: EM | Admit: 2013-11-06 | Discharge: 2013-11-08 | DRG: 247 | Disposition: A | Discharge status: Home Health | Payer: Medicare Other | Attending: Cardiology | Admitting: Cardiology

## 2013-11-06 DIAGNOSIS — I723 Aneurysm of iliac artery: Secondary | ICD-10-CM | POA: Diagnosis present

## 2013-11-06 DIAGNOSIS — E785 Hyperlipidemia, unspecified: Secondary | ICD-10-CM | POA: Diagnosis present

## 2013-11-06 DIAGNOSIS — Z87891 Personal history of nicotine dependence: Secondary | ICD-10-CM

## 2013-11-06 DIAGNOSIS — I249 Acute ischemic heart disease, unspecified: Secondary | ICD-10-CM | POA: Diagnosis present

## 2013-11-06 DIAGNOSIS — Z8249 Family history of ischemic heart disease and other diseases of the circulatory system: Secondary | ICD-10-CM

## 2013-11-06 DIAGNOSIS — Z8551 Personal history of malignant neoplasm of bladder: Secondary | ICD-10-CM

## 2013-11-06 DIAGNOSIS — G252 Other specified forms of tremor: Secondary | ICD-10-CM

## 2013-11-06 DIAGNOSIS — G250 Essential tremor: Secondary | ICD-10-CM | POA: Diagnosis present

## 2013-11-06 DIAGNOSIS — K219 Gastro-esophageal reflux disease without esophagitis: Secondary | ICD-10-CM | POA: Diagnosis present

## 2013-11-06 DIAGNOSIS — I129 Hypertensive chronic kidney disease with stage 1 through stage 4 chronic kidney disease, or unspecified chronic kidney disease: Secondary | ICD-10-CM | POA: Diagnosis present

## 2013-11-06 DIAGNOSIS — I214 Non-ST elevation (NSTEMI) myocardial infarction: Secondary | ICD-10-CM

## 2013-11-06 DIAGNOSIS — N182 Chronic kidney disease, stage 2 (mild): Secondary | ICD-10-CM | POA: Diagnosis present

## 2013-11-06 DIAGNOSIS — I2510 Atherosclerotic heart disease of native coronary artery without angina pectoris: Secondary | ICD-10-CM | POA: Diagnosis present

## 2013-11-06 DIAGNOSIS — I2119 ST elevation (STEMI) myocardial infarction involving other coronary artery of inferior wall: Principal | ICD-10-CM | POA: Diagnosis present

## 2013-11-06 HISTORY — PX: CORONARY ANGIOPLASTY WITH STENT PLACEMENT: SHX49

## 2013-11-06 HISTORY — DX: Atherosclerotic heart disease of native coronary artery without angina pectoris: I25.10

## 2013-11-06 HISTORY — DX: Acute myocardial infarction, unspecified: I21.9

## 2013-11-06 HISTORY — PX: LEFT HEART CATHETERIZATION WITH CORONARY ANGIOGRAM: SHX5451

## 2013-11-06 LAB — BASIC METABOLIC PANEL
BUN: 12 mg/dL (ref 6–23)
CO2: 26 mEq/L (ref 19–32)
CREATININE: 1.27 mg/dL (ref 0.50–1.35)
Calcium: 9.6 mg/dL (ref 8.4–10.5)
Chloride: 103 mEq/L (ref 96–112)
GFR calc Af Amer: 57 mL/min — ABNORMAL LOW (ref >90)
GFR calc non Af Amer: 49 mL/min — ABNORMAL LOW (ref >90)
GLUCOSE: 118 mg/dL — AB (ref 70–99)
Potassium: 4.9 mEq/L (ref 3.7–5.3)
Sodium: 141 mEq/L (ref 137–147)

## 2013-11-06 LAB — CBC WITH DIFFERENTIAL/PLATELET
BASOS ABS: 0 10*3/uL (ref 0.0–0.1)
Basophils Absolute: 0 10*3/uL (ref 0.0–0.1)
Basophils Relative: 0 % (ref 0–1)
Basophils Relative: 0 % (ref 0–1)
EOS ABS: 0.2 10*3/uL (ref 0.0–0.7)
EOS ABS: 0.1 10*3/uL (ref 0.0–0.7)
EOS PCT: 2 % (ref 0–5)
EOS PCT: 2 % (ref 0–5)
HCT: 47.5 % (ref 39.0–52.0)
HEMATOCRIT: 46 % (ref 39.0–52.0)
Hemoglobin: 16.3 g/dL (ref 13.0–17.0)
Hemoglobin: 15.7 g/dL (ref 13.0–17.0)
LYMPHS PCT: 37 % (ref 12–46)
Lymphocytes Relative: 27 % (ref 12–46)
Lymphs Abs: 2.5 10*3/uL (ref 0.7–4.0)
Lymphs Abs: 2 10*3/uL (ref 0.7–4.0)
MCH: 32.1 pg (ref 26.0–34.0)
MCH: 31.8 pg (ref 26.0–34.0)
MCHC: 34.3 g/dL (ref 30.0–36.0)
MCHC: 34.1 g/dL (ref 30.0–36.0)
MCV: 93.7 fL (ref 78.0–100.0)
MCV: 93.1 fL (ref 78.0–100.0)
Monocytes Absolute: 0.5 10*3/uL (ref 0.1–1.0)
Monocytes Absolute: 0.6 10*3/uL (ref 0.1–1.0)
Monocytes Relative: 8 % (ref 3–12)
Monocytes Relative: 8 % (ref 3–12)
Neutro Abs: 3.5 10*3/uL (ref 1.7–7.7)
Neutro Abs: 4.7 10*3/uL (ref 1.7–7.7)
Neutrophils Relative %: 53 % (ref 43–77)
Neutrophils Relative %: 63 % (ref 43–77)
PLATELETS: 176 10*3/uL (ref 150–400)
PLATELETS: 170 10*3/uL (ref 150–400)
RBC: 5.07 MIL/uL (ref 4.22–5.81)
RBC: 4.94 MIL/uL (ref 4.22–5.81)
RDW: 13.9 % (ref 11.5–15.5)
RDW: 13.8 % (ref 11.5–15.5)
WBC: 6.7 10*3/uL (ref 4.0–10.5)
WBC: 7.4 10*3/uL (ref 4.0–10.5)

## 2013-11-06 LAB — LIPID PANEL
CHOLESTEROL: 166 mg/dL (ref 0–200)
HDL: 36 mg/dL — AB (ref >39)
LDL CALC: 108 mg/dL — AB (ref 0–99)
Total CHOL/HDL Ratio: 4.6 RATIO
Triglycerides: 110 mg/dL (ref <150)
VLDL: 22 mg/dL (ref 0–40)

## 2013-11-06 LAB — PROTIME-INR
INR: 0.95 (ref 0.00–1.49)
PROTHROMBIN TIME: 12.5 s (ref 11.6–15.2)

## 2013-11-06 LAB — TROPONIN I
TROPONIN I: 8.39 ng/mL — AB (ref <0.30)
Troponin I: 0.78 ng/mL (ref <0.30)
Troponin I: 1.27 ng/mL (ref <0.30)

## 2013-11-06 LAB — COMPREHENSIVE METABOLIC PANEL
ALT: 8 U/L (ref 0–53)
AST: 22 U/L (ref 0–37)
Albumin: 3.3 g/dL — ABNORMAL LOW (ref 3.5–5.2)
Alkaline Phosphatase: 42 U/L (ref 39–117)
BUN: 11 mg/dL (ref 6–23)
CALCIUM: 8.9 mg/dL (ref 8.4–10.5)
CHLORIDE: 102 meq/L (ref 96–112)
CO2: 22 mEq/L (ref 19–32)
CREATININE: 1.06 mg/dL (ref 0.50–1.35)
GFR calc Af Amer: 71 mL/min — ABNORMAL LOW (ref >90)
GFR, EST NON AFRICAN AMERICAN: 61 mL/min — AB (ref >90)
GLUCOSE: 109 mg/dL — AB (ref 70–99)
Potassium: 4 mEq/L (ref 3.7–5.3)
Sodium: 137 mEq/L (ref 137–147)
Total Bilirubin: 0.4 mg/dL (ref 0.3–1.2)
Total Protein: 6.2 g/dL (ref 6.0–8.3)

## 2013-11-06 LAB — I-STAT CHEM 8, ED
BUN: 12 mg/dL (ref 6–23)
CALCIUM ION: 1.14 mmol/L (ref 1.13–1.30)
CHLORIDE: 102 meq/L (ref 96–112)
Creatinine, Ser: 1.3 mg/dL (ref 0.50–1.35)
Glucose, Bld: 118 mg/dL — ABNORMAL HIGH (ref 70–99)
HEMATOCRIT: 52 % (ref 39.0–52.0)
Hemoglobin: 17.7 g/dL — ABNORMAL HIGH (ref 13.0–17.0)
Potassium: 4.8 mEq/L (ref 3.7–5.3)
SODIUM: 141 meq/L (ref 137–147)
TCO2: 26 mmol/L (ref 0–100)

## 2013-11-06 LAB — TSH: TSH: 1.307 u[IU]/mL (ref 0.350–4.500)

## 2013-11-06 LAB — POCT ACTIVATED CLOTTING TIME: Activated Clotting Time: 376 seconds

## 2013-11-06 LAB — I-STAT TROPONIN, ED: TROPONIN I, POC: 0.43 ng/mL — AB (ref 0.00–0.08)

## 2013-11-06 LAB — APTT: aPTT: 27 seconds (ref 24–37)

## 2013-11-06 SURGERY — LEFT HEART CATHETERIZATION WITH CORONARY ANGIOGRAM
Anesthesia: LOCAL

## 2013-11-06 MED ORDER — HEPARIN (PORCINE) IN NACL 2-0.9 UNIT/ML-% IJ SOLN
INTRAMUSCULAR | Status: AC
  Filled 2013-11-06: qty 1000

## 2013-11-06 MED ORDER — DIAZEPAM 5 MG PO TABS
5.0000 mg | ORAL_TABLET | ORAL | Status: DC
  Filled 2013-11-06: qty 1

## 2013-11-06 MED ORDER — BIVALIRUDIN 250 MG IV SOLR
INTRAVENOUS | Status: AC
  Filled 2013-11-06: qty 250

## 2013-11-06 MED ORDER — LIDOCAINE HCL (PF) 1 % IJ SOLN
INTRAMUSCULAR | Status: AC
  Filled 2013-11-06: qty 30

## 2013-11-06 MED ORDER — FENTANYL CITRATE 0.05 MG/ML IJ SOLN
INTRAMUSCULAR | Status: AC
  Filled 2013-11-06: qty 2

## 2013-11-06 MED ORDER — SODIUM CHLORIDE 0.9 % IJ SOLN
3.0000 mL | Freq: Two times a day (BID) | INTRAMUSCULAR | Status: DC
  Administered 2013-11-06 – 2013-11-08 (×3): 3 mL via INTRAVENOUS

## 2013-11-06 MED ORDER — HEPARIN BOLUS VIA INFUSION
4000.0000 [IU] | Freq: Once | INTRAVENOUS | Status: AC
  Administered 2013-11-06: 4000 [IU] via INTRAVENOUS
  Filled 2013-11-06: qty 4000

## 2013-11-06 MED ORDER — HEART ATTACK BOUNCING BOOK
Freq: Once | Status: AC
  Administered 2013-11-06: 22:00:00
  Filled 2013-11-06: qty 1

## 2013-11-06 MED ORDER — NITROGLYCERIN 0.4 MG SL SUBL: 0.4000 mg | SUBLINGUAL_TABLET | SUBLINGUAL | Status: DC | PRN

## 2013-11-06 MED ORDER — FAMOTIDINE IN NACL 20-0.9 MG/50ML-% IV SOLN
INTRAVENOUS | Status: AC
  Filled 2013-11-06: qty 50

## 2013-11-06 MED ORDER — CLOPIDOGREL BISULFATE 75 MG PO TABS
75.0000 mg | ORAL_TABLET | Freq: Every day | ORAL | Status: DC
  Administered 2013-11-07 – 2013-11-08 (×2): 75 mg via ORAL
  Filled 2013-11-06 (×2): qty 1

## 2013-11-06 MED ORDER — SODIUM CHLORIDE 0.9 % IJ SOLN: 3.0000 mL | INTRAMUSCULAR | Status: DC | PRN

## 2013-11-06 MED ORDER — SODIUM CHLORIDE 0.9 % IV SOLN: INTRAVENOUS | Status: AC

## 2013-11-06 MED ORDER — ASPIRIN 81 MG PO CHEW: 81.0000 mg | CHEWABLE_TABLET | ORAL | Status: DC

## 2013-11-06 MED ORDER — HEPARIN (PORCINE) IN NACL 100-0.45 UNIT/ML-% IJ SOLN
1000.0000 [IU]/h | INTRAMUSCULAR | Status: DC
  Administered 2013-11-06: 1000 [IU]/h via INTRAVENOUS
  Filled 2013-11-06: qty 250

## 2013-11-06 MED ORDER — METOPROLOL TARTRATE 12.5 MG HALF TABLET
12.5000 mg | ORAL_TABLET | Freq: Two times a day (BID) | ORAL | Status: DC
  Administered 2013-11-06 – 2013-11-08 (×4): 12.5 mg via ORAL
  Filled 2013-11-06 (×5): qty 1

## 2013-11-06 MED ORDER — IOHEXOL 350 MG/ML SOLN
100.0000 mL | Freq: Once | INTRAVENOUS | Status: AC | PRN
  Administered 2013-11-06: 100 mL via INTRAVENOUS

## 2013-11-06 MED ORDER — SODIUM CHLORIDE 0.9 % IV SOLN
250.0000 mL | INTRAVENOUS | Status: DC | PRN
  Administered 2013-11-06: 250 mL via INTRAVENOUS

## 2013-11-06 MED ORDER — MIDAZOLAM HCL 2 MG/2ML IJ SOLN
INTRAMUSCULAR | Status: AC
  Filled 2013-11-06: qty 2

## 2013-11-06 MED ORDER — NITROGLYCERIN IN D5W 200-5 MCG/ML-% IV SOLN: 5.0000 ug/min | INTRAVENOUS | Status: DC

## 2013-11-06 MED ORDER — NITROGLYCERIN 0.2 MG/ML ON CALL CATH LAB
INTRAVENOUS | Status: AC
  Filled 2013-11-06: qty 1

## 2013-11-06 MED ORDER — ASPIRIN 81 MG PO CHEW: 81.0000 mg | CHEWABLE_TABLET | Freq: Every day | ORAL | Status: DC

## 2013-11-06 MED ORDER — ASPIRIN EC 81 MG PO TBEC
81.0000 mg | DELAYED_RELEASE_TABLET | Freq: Every day | ORAL | Status: DC
  Administered 2013-11-07 – 2013-11-08 (×2): 81 mg via ORAL
  Filled 2013-11-06 (×2): qty 1

## 2013-11-06 MED ORDER — CLOPIDOGREL BISULFATE 300 MG PO TABS
300.0000 mg | ORAL_TABLET | ORAL | Status: AC
  Administered 2013-11-06: 300 mg via ORAL
  Filled 2013-11-06: qty 1

## 2013-11-06 MED ORDER — ATORVASTATIN CALCIUM 80 MG PO TABS
80.0000 mg | ORAL_TABLET | Freq: Every day | ORAL | Status: DC
  Administered 2013-11-06: 22:00:00 80 mg via ORAL
  Filled 2013-11-06 (×4): qty 1

## 2013-11-06 MED ORDER — CLOPIDOGREL BISULFATE 300 MG PO TABS
ORAL_TABLET | ORAL | Status: AC
  Filled 2013-11-06: qty 1

## 2013-11-06 MED ORDER — ASPIRIN 81 MG PO CHEW: 324.0000 mg | CHEWABLE_TABLET | ORAL | Status: AC

## 2013-11-06 MED ORDER — PANTOPRAZOLE SODIUM 40 MG PO TBEC
40.0000 mg | DELAYED_RELEASE_TABLET | Freq: Every day | ORAL | Status: DC
  Administered 2013-11-07: 40 mg via ORAL
  Filled 2013-11-06 (×2): qty 1

## 2013-11-06 MED ORDER — ASPIRIN 300 MG RE SUPP: 300.0000 mg | RECTAL | Status: AC

## 2013-11-06 MED ORDER — SODIUM CHLORIDE 0.9 % IJ SOLN: 3.0000 mL | Freq: Two times a day (BID) | INTRAMUSCULAR | Status: DC

## 2013-11-06 MED ORDER — SODIUM CHLORIDE 0.9 % IV SOLN
INTRAVENOUS | Status: DC
  Administered 2013-11-06: 06:00:00 via INTRAVENOUS

## 2013-11-06 NOTE — Cardiovascular Report (Signed)
NAMETADEO, BESECKER                ACCOUNT NO.:  [id number]  MEDICAL RECORD NO.:  [id number]  LOCATION:  [facility]                         FACILITY:  [facility]  PHYSICIAN:  Irine Heminger N. Terrence Dupont, M.D. DATE OF BIRTH:  [birth year]  DATE OF PROCEDURE:  11/06/2013 DATE OF DISCHARGE:                           CARDIAC CATHETERIZATION   PROCEDURES: 1. Left cardiac cath with selective left and right coronary     angiography, LV graphy via right groin using Judkins technique. 2. Successful PTCA to mid RCA using 2.5 x 12 mm long Sprinter Legend     balloon. 3. PTCA to mid RCA using 3.0 x 20 mm long Hooven Emerge balloon. 4. Successful deployment of 3.0 x 38 mm long Xience Alpine drug-     eluting stent in mid RCA. 5. Successful postdilatation of this stent using 3.25 x 20 mm long      Emerge balloon.  INDICATION FOR THE PROCEDURE:  Mr. Grimsley is an 80 year old male with past medical history significant for hypertension, remote tobacco abuse, and history of CA of bladder.  He came to the ER by EMS complaining of retrosternal chest pain radiating to his back associated with diaphoresis which woke him up around  1:30 a.m.  The patient states he had similar pain yesterday but did not seek any medical attention. Today, pain was more severe, so decided to call EMS.  The patient denies any shortness of breath.  Denies palpitation, lightheadedness, or syncope.  Denies any chest pain at present but complains of vague upper back pain.  EKG done in the ER showed normal sinus rhythm with minimal ST elevation in leads 3, aVF and minor ST depression in anterolateral leads.  The patient was noted to have minimally elevated troponin I. Due to typical anginal chest pain and minor EKG changes and elevated cardiac enzymes, I discussed with the patient and his family at length regarding cardiac cath and possible PTCA stenting, its risks and benefits, i.e., death, MI, stroke, need for emergency CABG, local vascular  complications, etc. and consented for the procedure.  PROCEDURE IN DETAIL:  After obtaining the informed consent, the patient was brought to the cath lab and was placed on fluoroscopy table.  Right groin was prepped and draped in usual fashion.  Xylocaine 1% was used for local anesthesia in the right groin.  With the help of thin wall needle, a 6-French arterial sheath was placed.  The sheath was aspirated and flushed.  Next, 6-French left Judkins catheter was advanced over the wire under fluoroscopic guidance up to the ascending aorta.  Wire was pulled out.  The catheter was aspirated and connected to the Manifold. Catheter was further advanced and engaged into left coronary ostium. Multiple views of the left system were taken.  Next, catheter was disengaged and was pulled out over the wire and was replaced with 6- [place] right Judkins catheter, which was advanced over the wire under fluoroscopic guidance up to the ascending aorta.  Wire was pulled out. The catheter was aspirated and connected to the Manifold.  Catheter was further advanced and engaged into right coronary ostium.  Multiple views of the right system were taken.  Next, catheter was disengaged  and was pulled out over the wire and was replaced with 6-French pigtail catheter which was advanced over the wire under fluoroscopic guidance up to the ascending aorta.  Wire was pulled out.  The catheter was aspirated and connected to the Manifold.  Catheter was further advanced across the aortic valve into the LV.  LV pressures were recorded.  Next, LV graft was done in 30-degree RAO position.  Post-angiographic pressures were recorded from LV and then pullback pressures were recorded from aorta. There was no gradient across the aortic valve.  Next, pigtail catheter was pulled out over the wire.  Sheaths were aspirated and flushed.  FINDINGS:  LV showed good LV systolic function.  EF of 50%-55%.  Left main was short.  The LAD has  50%-60% proximal and 20-30% mid stenosis. Diagonal 1 is very small.  Diagonal 2 has 40-50% ostial and proximal stenosis.  Diagonal 3 is very small which is patent.  Left circumflex is small which is patent.  OM-1 is patent.  RCA has 30%-40% proximal and 99% mid stenosis and 50-60% mid and distal junction stenosis.  PLV branch has 40%-50% mid stenosis.  The PDA is very small.  INTERVENTIONAL PROCEDURE:  Successful PTCA to mid RCA was done using 2.5 x 12 mm long Sprinter Legend balloon going up to 8 atmospheric pressure and then 3.0 x 20 mm long Narcissa Emerge balloon was used for predilatation in mid RCA going up to 18 atmospheric pressure and then 3.0 x 38 mm long Xience Alpine drug-eluting stent was deployed at 10 atmospheric pressure in mid and distal junction of the RCA. This stent was post dilated using 3.25 x 20 mm long Gladwin Emerge balloon going up to 18 atmospheric pressure. Lesion dilated from 99% to 0% residual with excellent TIMI grade 3 distal flow without evidence of dissection or distal embolization.  The patient received weight based Angiomax and total of 600 mg of Plavix during the procedure.  The patient tolerated the procedure well.  There were no complications.  The patient was transferred to recovery room in stable condition.     Allegra Lai. Terrence Dupont, M.D.     MNH/MEDQ  D:  11/06/2013  T:  11/06/2013  Job:  [id number]

## 2013-11-06 NOTE — ED Notes (Signed)
Phlebotomy paged

## 2013-11-06 NOTE — Progress Notes (Signed)
ANTICOAGULATION CONSULT NOTE - Initial Consult  Pharmacy Consult for Heparin  Indication: chest pain/ACS  Allergies  Allergen Reactions  . Cephalexin Other (See Comments)    Bloody loose stools    Patient Measurements: Height: 5\' 9"  (175.3 cm) Weight: 194 lb (87.998 kg) IBW/kg (Calculated) : 70.7  Vital Signs: Temp: 98.6 F (37 C) (03/19 0332) Temp src: Oral (03/19 0332) BP: 125/70 mmHg (03/19 0530) Pulse Rate: 80 (03/19 0530)  Labs:  Recent Labs  11/06/13 0342 11/06/13 0348 11/06/13 0406  HGB 16.3 17.7*  --   HCT 47.5 52.0  --   PLT 176  --   --   CREATININE  --  1.30 1.27  TROPONINI  --   --  0.78*    Estimated Creatinine Clearance: 45.8 ml/min (by C-G formula based on Cr of 1.27).   Medical History: Past Medical History  Diagnosis Date  . DIVERTICULOSIS, COLON 10/07/2006    pt. has freq boutw with constipation alternates with diarrhea  . HYPERLIPIDEMIA 10/07/2006  . TORTICOLLIS 10/27/2009  . Meralgia paraesthetica   . HYPERTENSION 10/07/2006  . History of bladder cancer UROTHELIAL CANCER OF BLADDER--  S/P TURBT 07-27-2011; TCC W/ HIGH GRADE SUPERFICIAL  RECEIVED BCG TX'S    MONTIORED BY DR [facility]  . History of sepsis 2007-- UROSEPSIS  . History of prostatitis   . GERD (gastroesophageal reflux disease)   . Benign essential tremor HANDS  . Iliac artery aneurysm, right COMMON--  LAST VISIT W/ DR LAWSON  3.03 X 3.24CM-- STABLE PER CT SCAN 03-04-2012  DONE BY PCP DR HOPPER    PT ASYMPTOMATIC  . Generalized weakness   . Poor memory   . Colon polyp     ? 2007    Assessment: 80 y/o M with CP/+trop to start heparin per pharmacy. Labs as above.   Goal of Therapy:  Heparin level 0.3-0.7 units/ml Monitor platelets by anticoagulation protocol: Yes   Plan:  -Heparin 4000 units BOLUS x 1 -Start heparin drip at 1000 units/hr -1400 HL -Daily CBC/HL -F/U cath plans, possibly today  Narda Bonds 11/06/2013,5:36 AM

## 2013-11-06 NOTE — ED Notes (Signed)
Consent obtained from patient for Cardiac Catheterization.

## 2013-11-06 NOTE — Progress Notes (Signed)
Utilization Review Completed Halana Deisher J. Marlyss Cissell, RN, BSN, NCM [phone]  

## 2013-11-06 NOTE — ED Notes (Signed)
Pt presents from home when he was woken up from sleep at 1:30am today with c/o chest pain and mid back pain.  Pt told EMS he was having pain down both of his arms.  EMS 12 lead unremarkable.  150/78 equal in both arms.  Pt is in remission from bladder cancer.  Pt given 324mg  Aspirin with EMS.  Pt wife states pt was complaining of being light headed and dizzy, no nausea with back pain.  Pt in the ED is c/o of back pain, no chest pain.

## 2013-11-06 NOTE — ED Notes (Signed)
Dr Otter at bedside  

## 2013-11-06 NOTE — Progress Notes (Signed)
Site area: right groin  Site Prior to Removal:  Level 0  Pressure Applied For 20 MINUTES    Minutes Beginning at 1245  Manual:   yes  Patient Status During Pull:  stable  Post Pull Groin Site:  Level 0  Post Pull Instructions Given:  yes  Post Pull Pulses Present:  yes  Dressing Applied:  yes  Comments:  4098 Gauze dressing applied secured with medipore tape. Rechecked at 1320 without change in assessment, dressing dry and intact, right dorsalis pedis +2, CSMs wnls to right lower extremity.

## 2013-11-06 NOTE — ED Notes (Signed)
Lab notified this RN of pt Troponin being elevated.

## 2013-11-06 NOTE — Interval H&P Note (Signed)
Cath Lab Visit (complete for each Cath Lab visit)  Clinical Evaluation Leading to the Procedure:   ACS: yes  Non-ACS:    Anginal Classification: CCS IV  Anti-ischemic medical therapy: Minimal Therapy (1 class of medications)  Non-Invasive Test Results: No non-invasive testing performed  Prior CABG: No previous CABG      History and Physical Interval Note:  11/06/2013 7:16 AM  [name]  has presented today for surgery, with the diagnosis of cp  The various methods of treatment have been discussed with the patient and family. After consideration of risks, benefits and other options for treatment, the patient has consented to  Procedure(s): LEFT HEART CATHETERIZATION WITH CORONARY ANGIOGRAM (N/A) as a surgical intervention .  The patient's history has been reviewed, patient examined, no change in status, stable for surgery.  I have reviewed the patient's chart and labs.  Questions were answered to the patient's satisfaction.     Clent Demark

## 2013-11-06 NOTE — ED Notes (Signed)
Dana with lab confirmed with this nurse pt Troponin 0.78.  MD [facility] notified.

## 2013-11-06 NOTE — ED Notes (Signed)
Notified RN and MD of elevated istat trop.

## 2013-11-06 NOTE — CV Procedure (Signed)
Left cardiacCath/PTCA stenting report dictated on 11/06/2013 dictation number is [id number]

## 2013-11-06 NOTE — ED Notes (Signed)
[name] with CT notified that Dr. Sharol Given wanted to add on CT Angio with abdomen and pelvis, [name] confirmed.

## 2013-11-06 NOTE — H&P (Signed)
[name] is an 80 y.o. male.   Chief Complaint: Chest pain/upper back pain HPI: Patient is 80 year old male with past medical history significant for hypertension, remote tobacco abuse, history of CVA of bladder, came to the ER by EMS complaining of retrosternal chest pain radiating to her back associated with diaphoresis which woke him up around 1:30 AM. Patient states he had similar pain yesterday but did not seek any medical attention today pain was more severe so decided to call EMS. Denies any shortness of breath denies any palpitation lightheadedness or syncope denies any chest pain at present but complains of vague upper back pain. EKG done in the ER showed normal sinus rhythm with minimal ST elevation in lead 3 aVF and minor ST depression in and true lateral leads patient was noted to have minimally elevated troponin I. Patient denies any palpitation lightheadedness or syncope. Denies any cardiac workup in the past.  Past Medical History  Diagnosis Date  . DIVERTICULOSIS, COLON 10/07/2006    pt. has freq boutw with constipation alternates with diarrhea  . HYPERLIPIDEMIA 10/07/2006  . TORTICOLLIS 10/27/2009  . Meralgia paraesthetica   . HYPERTENSION 10/07/2006  . History of bladder cancer UROTHELIAL CANCER OF BLADDER--  S/P TURBT 07-27-2011; TCC W/ HIGH GRADE SUPERFICIAL  RECEIVED BCG TX'S    [facility]  . History of sepsis 2007-- UROSEPSIS  . History of prostatitis   . GERD (gastroesophageal reflux disease)   . Benign essential tremor HANDS  . Iliac artery aneurysm, right COMMON--  LAST VISIT W/ DR LAWSON  3.03 X 3.24CM-- STABLE PER CT SCAN 03-04-2012  DONE BY PCP DR HOPPER    PT ASYMPTOMATIC  . Generalized weakness   . Poor memory   . Colon polyp     ? 2007    Past Surgical History  Procedure Laterality Date  . Tonsillectomy    . Colonoscopy       Diverticulosis  . Transurethral resection of bladder tumor  07/27/2011    Procedure: TRANSURETHRAL RESECTION OF BLADDER  TUMOR (TURBT);  Surgeon: Malka So;  Location: [facility];  Service: Urology;  Laterality: N/A;  Cystoscopy/Transurethral Resection of Bladder Tumor  . Ercp  02/01/2012    Procedure: ENDOSCOPIC RETROGRADE CHOLANGIOPANCREATOGRAPHY (ERCP);  Surgeon: Beryle Beams, MD;  Location: [facility];  Service: Endoscopy;  Laterality: N/A;  . Cataract extraction w/ intraocular lens  implant, bilateral    . Laparoscopic cholecystectomy  06-27-2007    Dr Johnathan Hausen  . Cystoscopy w/ retrogrades Bilateral 12/26/2012    Procedure: CYSTOSCOPY WITH Bilateral RETROGRADE PYELOGRAM;  Surgeon: Malka So, MD;  Location: [facility];  Service: Urology;  Laterality: Bilateral;  BLADDER BIOPSY  . Fulguration of bladder tumor N/A 12/26/2012    Procedure: Multiple Bladder Biopsy;  Surgeon: Malka So, MD;  Location: [facility];  Service: Urology;  Laterality: N/A;    Family History  Problem Relation Age of Onset  . Heart attack Father 90  . Colon cancer Mother 16  . Hypertension Mother   . Breast cancer Maternal Grandmother   . Heart attack Maternal Grandfather     late 35s  . Heart failure Sister   . Diabetes Neg Hx    Social History:  reports that he quit smoking about 40 years ago. He has never used smokeless tobacco. He reports that he drinks alcohol. He reports that he does not use illicit drugs.  Allergies:  Allergies  Allergen Reactions  .  Cephalexin Other (See Comments)    Bloody loose stools     (Not in a hospital admission)  Results for orders placed during the hospital encounter of 11/06/13 (from the past 48 hour(s))  CBC WITH DIFFERENTIAL     Status: None   Collection Time    11/06/13  3:42 AM      Result Value Ref Range   WBC 6.7  4.0 - 10.5 K/uL   RBC 5.07  4.22 - 5.81 MIL/uL   Hemoglobin 16.3  13.0 - 17.0 g/dL   HCT 47.5  39.0 - 52.0 %   MCV 93.7  78.0 - 100.0 fL   MCH 32.1  26.0 - 34.0 pg   MCHC 34.3  30.0 - 36.0 g/dL   RDW 13.9  11.5 - 15.5 %    Platelets 176  150 - 400 K/uL   Neutrophils Relative % 53  43 - 77 %   Neutro Abs 3.5  1.7 - 7.7 K/uL   Lymphocytes Relative 37  12 - 46 %   Lymphs Abs 2.5  0.7 - 4.0 K/uL   Monocytes Relative 8  3 - 12 %   Monocytes Absolute 0.5  0.1 - 1.0 K/uL   Eosinophils Relative 2  0 - 5 %   Eosinophils Absolute 0.2  0.0 - 0.7 K/uL   Basophils Relative 0  0 - 1 %   Basophils Absolute 0.0  0.0 - 0.1 K/uL  I-STAT TROPOININ, ED     Status: Abnormal   Collection Time    11/06/13  3:46 AM      Result Value Ref Range   Troponin i, poc 0.43 (*) 0.00 - 0.08 ng/mL   Comment NOTIFIED PHYSICIAN     Comment 3            Comment: Due to the release kinetics of cTnI,     a negative result within the first hours     of the onset of symptoms does not rule out     myocardial infarction with certainty.     If myocardial infarction is still suspected,     repeat the test at appropriate intervals.  I-STAT CHEM 8, ED     Status: Abnormal   Collection Time    11/06/13  3:48 AM      Result Value Ref Range   Sodium 141  137 - 147 mEq/L   Potassium 4.8  3.7 - 5.3 mEq/L   Chloride 102  96 - 112 mEq/L   BUN 12  6 - 23 mg/dL   Creatinine, Ser 1.30  0.50 - 1.35 mg/dL   Glucose, Bld 118 (*) 70 - 99 mg/dL   Calcium, Ion 1.14  1.13 - 1.30 mmol/L   TCO2 26  0 - 100 mmol/L   Hemoglobin 17.7 (*) 13.0 - 17.0 g/dL   HCT 52.0  39.0 - 52.0 %   Dg Chest Port 1 View  11/06/2013   CLINICAL DATA:  Chest pain and shortness of breath.  EXAM: PORTABLE CHEST - 1 VIEW  COMPARISON:  PA and lateral chest 06/28/2011.  FINDINGS: There is some subsegmental atelectasis in the lung bases. Lungs otherwise clear. Heart size normal. No pneumothorax or pleural effusion.  IMPRESSION: No acute finding. Mild subsegmental atelectasis in the lung bases noted.   Electronically Signed   By: Inge Rise M.D.   On: 11/06/2013 03:51    Review of Systems  Constitutional: Negative for fever and chills.  Eyes: Negative for double vision,  photophobia and pain.  Respiratory: Negative for cough, hemoptysis, sputum production and shortness of breath.   Cardiovascular: Positive for chest pain. Negative for palpitations, orthopnea, claudication and leg swelling.  Gastrointestinal: Negative for nausea, vomiting and abdominal pain.  Genitourinary: Negative for dysuria.  Neurological: Negative for dizziness and headaches.    Blood pressure 138/80, pulse 79, temperature 98.6 F (37 C), temperature source Oral, resp. rate 19, height 5\' 9"  (1.753 m), weight 87.998 kg (194 lb), SpO2 96.00%. Physical Exam  Constitutional: He is oriented to person, place, and time.  HENT:  Head: Normocephalic and atraumatic.  Eyes: Conjunctivae are normal. Left eye exhibits no discharge. No scleral icterus.  Neck: Normal range of motion. Neck supple. No JVD present. No tracheal deviation present. No thyromegaly present.  Cardiovascular: Normal rate and regular rhythm.   Murmur (Soft systolic murmur noted no S3 gallop) heard. Respiratory: Effort normal and breath sounds normal. No respiratory distress. He has no wheezes. He has no rales.  GI: Soft. Bowel sounds are normal. He exhibits no distension. There is no tenderness.  Musculoskeletal: He exhibits no edema and no tenderness.  Bilateral pulses equal in both arms and leg  Neurological: He is alert and oriented to person, place, and time.     Assessment/Plan Acute coronary syndrome Chest pain/back pain rule out? aortic dissection Hypertension Remote tobacco abuse History of CVA of bladder Chronic kidney disease stage II Hypercholesteremia Plan As per orders  Discussed with patient and his wife at length regarding left cardiac cath possible PTCA stenting its risk and benefits i.e. death MI stroke need for emergency CABG local vascular complications etc. and consents for PCI. Patient will be going for CT of chest to rule out aortic dissection.  Lissette Schenk N 11/06/2013, 4:22 AM

## 2013-11-06 NOTE — ED Provider Notes (Signed)
CSN: [id number]     Arrival date & time 11/06/13  0315 History   First MD Initiated Contact with Patient 11/06/13 [id number]     Chief Complaint  Patient presents with  . Chest Pain  . Back Pain     (Consider location/radiation/quality/duration/timing/severity/associated sxs/prior Treatment) HPI 80 year old male presents to emergency room from home with complaint of chest and back pain.  Patient reports she was awoken at 1:30 with chest pain, and upper mid back pain.  Patient reports he had some pain down both of his arms.  Patient has history of hypertension, for which she takes Toprol, and Diovan.  Patient has past history of bladder cancer, diverticulosis.  Patient reports he has had positional dizziness, ongoing for last several months.  Family feels that it is worse over the last few days.  Patient reports that chest pain, has nearly resolved, but he still has persistent pain in his back.  No prior history of coronary disease.  He has never seen a cardiologist. Past Medical History  Diagnosis Date  . DIVERTICULOSIS, COLON 10/07/2006    pt. has freq boutw with constipation alternates with diarrhea  . HYPERLIPIDEMIA 10/07/2006  . TORTICOLLIS 10/27/2009  . Meralgia paraesthetica   . HYPERTENSION 10/07/2006  . History of bladder cancer UROTHELIAL CANCER OF BLADDER--  S/P TURBT 07-27-2011; TCC W/ HIGH GRADE SUPERFICIAL  RECEIVED BCG TX'S    MONTIORED BY DR [facility]  . History of sepsis 2007-- UROSEPSIS  . History of prostatitis   . GERD (gastroesophageal reflux disease)   . Benign essential tremor HANDS  . Iliac artery aneurysm, right COMMON--  LAST VISIT W/ DR LAWSON  3.03 X 3.24CM-- STABLE PER CT SCAN 03-04-2012  DONE BY PCP DR HOPPER    PT ASYMPTOMATIC  . Generalized weakness   . Poor memory   . Colon polyp     ? 2007   Past Surgical History  Procedure Laterality Date  . Tonsillectomy    . Colonoscopy       Diverticulosis  . Transurethral resection of bladder tumor  07/27/2011     Procedure: TRANSURETHRAL RESECTION OF BLADDER TUMOR (TURBT);  Surgeon: Malka So;  Location: [facility];  Service: Urology;  Laterality: N/A;  Cystoscopy/Transurethral Resection of Bladder Tumor  . Ercp  02/01/2012    Procedure: ENDOSCOPIC RETROGRADE CHOLANGIOPANCREATOGRAPHY (ERCP);  Surgeon: Beryle Beams, MD;  Location: [facility];  Service: Endoscopy;  Laterality: N/A;  . Cataract extraction w/ intraocular lens  implant, bilateral    . Laparoscopic cholecystectomy  06-27-2007    Dr Johnathan Hausen  . Cystoscopy w/ retrogrades Bilateral 12/26/2012    Procedure: CYSTOSCOPY WITH Bilateral RETROGRADE PYELOGRAM;  Surgeon: Malka So, MD;  Location: [facility];  Service: Urology;  Laterality: Bilateral;  BLADDER BIOPSY  . Fulguration of bladder tumor N/A 12/26/2012    Procedure: Multiple Bladder Biopsy;  Surgeon: Malka So, MD;  Location: [facility];  Service: Urology;  Laterality: N/A;   Family History  Problem Relation Age of Onset  . Heart attack Father 66  . Colon cancer Mother 58  . Hypertension Mother   . Breast cancer Maternal Grandmother   . Heart attack Maternal Grandfather     late 70s  . Heart failure Sister   . Diabetes Neg Hx    History  Substance Use Topics  . Smoking status: Former Smoker -- 25 years    Quit date: 08/21/1973  . Smokeless tobacco: Never Used  Comment: smoked  1944-1975 , up to 1 ppd  . Alcohol Use: Yes     Comment:  very rarely    Review of Systems  Unable to perform ROS: Acuity of condition      Allergies  Cephalexin  Home Medications   Current Outpatient Rx  Name  Route  Sig  Dispense  Refill  . acetaminophen (TYLENOL) 325 MG tablet   Oral   Take 650 mg by mouth every 6 (six) hours as needed. For pain         . metoprolol succinate (TOPROL-XL) 50 MG 24 hr tablet      TAKE 1/2 TABLET BY MOUTH TWICE DAILY   90 tablet   1   . omeprazole (PRILOSEC) 20 MG capsule   Oral   Take 20 mg by mouth  daily.         . valsartan (DIOVAN) 320 MG tablet   Oral   Take 320 mg by mouth. 1/2 by mouth in the am, 1/2 by mouth in the pm          BP 138/80  Pulse 79  Temp(Src) 98.6 F (37 C) (Oral)  Resp 19  Ht 5\' 9"  (1.753 m)  Wt 194 lb (87.998 kg)  BMI 28.64 kg/m2  SpO2 96% Physical Exam  Nursing note and vitals reviewed. Constitutional: He is oriented to person, place, and time. He appears well-developed and well-nourished. He appears distressed.  HENT:  Head: Normocephalic and atraumatic.  Nose: Nose normal.  Mouth/Throat: Oropharynx is clear and moist.  Eyes: Conjunctivae and EOM are normal. Pupils are equal, round, and reactive to light.  Neck: Normal range of motion. Neck supple. No JVD present. No tracheal deviation present. No thyromegaly present.  No carotid bruits noted  Cardiovascular: Normal rate, regular rhythm, normal heart sounds and intact distal pulses.  Exam reveals no gallop and no friction rub.   No murmur heard. Pulmonary/Chest: Effort normal and breath sounds normal. No stridor. No respiratory distress. He has no wheezes. He has no rales. He exhibits no tenderness.  Abdominal: Soft. Bowel sounds are normal. He exhibits no distension and no mass. There is no tenderness. There is no rebound and no guarding.  Musculoskeletal: Normal range of motion. He exhibits no edema and no tenderness.  Lymphadenopathy:    He has no cervical adenopathy.  Neurological: He is alert and oriented to person, place, and time. No cranial nerve deficit. He exhibits normal muscle tone. Coordination normal.  Patient has positional vertigo  Skin: Skin is warm. No rash noted. He is diaphoretic. No erythema. No pallor.  Psychiatric: He has a normal mood and affect. His behavior is normal. Judgment and thought content normal.    ED Course  Procedures (including critical care time)  CRITICAL CARE Performed by: Kalman Drape Total critical care time: 60 min  Critical care time was  exclusive of separately billable procedures and treating other patients. Critical care was necessary to treat or prevent imminent or life-threatening deterioration. Critical care was time spent personally by me on the following activities: development of treatment plan with patient and/or surrogate as well as nursing, discussions with consultants, evaluation of patient's response to treatment, examination of patient, obtaining history from patient or surrogate, ordering and performing treatments and interventions, ordering and review of laboratory studies, ordering and review of radiographic studies, pulse oximetry and re-evaluation of patient's condition.  Labs Review Labs Reviewed  TROPONIN I - Abnormal; Notable for the following:    Troponin I  0.78 (*)    All other components within normal limits  BASIC METABOLIC PANEL - Abnormal; Notable for the following:    Glucose, Bld 118 (*)    GFR calc non Af Amer 49 (*)    GFR calc Af Amer 57 (*)    All other components within normal limits  I-STAT CHEM 8, ED - Abnormal; Notable for the following:    Glucose, Bld 118 (*)    Hemoglobin 17.7 (*)    All other components within normal limits  I-STAT TROPOININ, ED - Abnormal; Notable for the following:    Troponin i, poc 0.43 (*)    All other components within normal limits  CBC WITH DIFFERENTIAL  TROPONIN I  TROPONIN I  TROPONIN I  PROTIME-INR  APTT  CBC WITH DIFFERENTIAL  TSH  COMPREHENSIVE METABOLIC PANEL  LIPID PANEL  CBC  HEPARIN LEVEL (UNFRACTIONATED)   Imaging Review Dg Chest Port 1 View  11/06/2013   CLINICAL DATA:  Chest pain and shortness of breath.  EXAM: PORTABLE CHEST - 1 VIEW  COMPARISON:  PA and lateral chest 06/28/2011.  FINDINGS: There is some subsegmental atelectasis in the lung bases. Lungs otherwise clear. Heart size normal. No pneumothorax or pleural effusion.  IMPRESSION: No acute finding. Mild subsegmental atelectasis in the lung bases noted.   Electronically Signed    By: Inge Rise M.D.   On: 11/06/2013 03:51   Ct Angio Chest Aortic Dissect W &/or W/o  11/06/2013   CLINICAL DATA:  Chest and back pain.  Question aortic dissection.  EXAM: CT ANGIOGRAPHY CHEST, ABDOMEN AND PELVIS  TECHNIQUE: Multidetector CT imaging through the chest, abdomen and pelvis was performed using the standard protocol during bolus administration of intravenous contrast. Multiplanar reconstructed images and MIPs were obtained and reviewed to evaluate the vascular anatomy.  CONTRAST:  100 mL OMNIPAQUE IOHEXOL 350 MG/ML SOLN  COMPARISON:  CTA chest and abdomen 06/26/2007.  FINDINGS: CTA CHEST FINDINGS  There is no aortic dissection or aneurysm. The patient has a normal three-vessel arch configuration. No pulmonary embolus is identified. There is no axillary, hilar or mediastinal lymphadenopathy. No pleural or pericardial effusion. Heart size is normal. Lungs demonstrate some emphysematous change. Mild atelectasis or scarring is also seen in the right base. Minimal dependent atelectasis is noted. No lytic or sclerotic bony lesion is seen.  Review of the MIP images confirms the above findings.  CTA ABDOMEN AND PELVIS FINDINGS  There is no abdominal aortic dissection. The infrarenal abdominal aortic is minimally dilated at 3.1 cm, unchanged. Scattered areas of mural thrombus are noted and also unchanged. The patient has a right common iliac artery aneurysm measuring 3.0 cm in diameter, unchanged. Branch vessels of the aorta are patent.  The patient is status post cholecystectomy. There is a subtle focus of increased attenuation in the right hepatic lobe measuring 1.6 cm on image 85 which is unchanged. 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 Bilateral renal cysts are unchanged. The adrenal glands, spleen and pancreas appear normal. Small fat containing bilateral inguinal hernias are noted. Diverticulosis without diverticulitis is identified. The colon is otherwise unremarkable. The stomach and small  bowel appear normal. There is no lymphadenopathy or fluid. Avascular necrosis of the left femoral head without fragmentation is noted. Thoracolumbar scoliosis is present.  Review of the MIP images confirms the above findings.  IMPRESSION: Negative for aortic dissection.  There is no acute abnormality.  Minimal aneurysmal dilatation of the abdominal aorta and a 3.0 cm right common iliac artery aneurysm  are unchanged. There is no hemorrhage.  Diverticulosis without diverticulitis.  Avascular necrosis of the left femoral head.   Electronically Signed   By: Inge Rise M.D.   On: 11/06/2013 05:20   Ct Angio Abd/pel W/ And/or W/o  11/06/2013   CLINICAL DATA:  Chest and back pain.  Question aortic dissection.  EXAM: CT ANGIOGRAPHY CHEST, ABDOMEN AND PELVIS  TECHNIQUE: Multidetector CT imaging through the chest, abdomen and pelvis was performed using the standard protocol during bolus administration of intravenous contrast. Multiplanar reconstructed images and MIPs were obtained and reviewed to evaluate the vascular anatomy.  CONTRAST:  100 mL OMNIPAQUE IOHEXOL 350 MG/ML SOLN  COMPARISON:  CTA chest and abdomen 06/26/2007.  FINDINGS: CTA CHEST FINDINGS  There is no aortic dissection or aneurysm. The patient has a normal three-vessel arch configuration. No pulmonary embolus is identified. There is no axillary, hilar or mediastinal lymphadenopathy. No pleural or pericardial effusion. Heart size is normal. Lungs demonstrate some emphysematous change. Mild atelectasis or scarring is also seen in the right base. Minimal dependent atelectasis is noted. No lytic or sclerotic bony lesion is seen.  Review of the MIP images confirms the above findings.  CTA ABDOMEN AND PELVIS FINDINGS  There is no abdominal aortic dissection. The infrarenal abdominal aortic is minimally dilated at 3.1 cm, unchanged. Scattered areas of mural thrombus are noted and also unchanged. The patient has a right common iliac artery aneurysm measuring  3.0 cm in diameter, unchanged. Branch vessels of the aorta are patent.  The patient is status post cholecystectomy. There is a subtle focus of increased attenuation in the right hepatic lobe measuring 1.6 cm on image 85 which is unchanged. 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 Bilateral renal cysts are unchanged. The adrenal glands, spleen and pancreas appear normal. Small fat containing bilateral inguinal hernias are noted. Diverticulosis without diverticulitis is identified. The colon is otherwise unremarkable. The stomach and small bowel appear normal. There is no lymphadenopathy or fluid. Avascular necrosis of the left femoral head without fragmentation is noted. Thoracolumbar scoliosis is present.  Review of the MIP images confirms the above findings.  IMPRESSION: Negative for aortic dissection.  There is no acute abnormality.  Minimal aneurysmal dilatation of the abdominal aorta and a 3.0 cm right common iliac artery aneurysm are unchanged. There is no hemorrhage.  Diverticulosis without diverticulitis.  Avascular necrosis of the left femoral head.   Electronically Signed   By: Inge Rise M.D.   On: 11/06/2013 05:20     EKG Interpretation None       Date: 11/06/2013  Rate:83  Rhythm: normal sinus rhythm  QRS Axis: normal  Intervals: QT prolonged  ST/T Wave abnormalities: ST depressions laterally  Conduction Disutrbances:none  Narrative Interpretation:   Old EKG Reviewed: none available    MDM   Final diagnoses:  NSTEMI (non-ST elevated myocardial infarction)   80 year old male with chest pain, and back pain waking from sleep tonight.  Concern for posterior infarction, aortic dissection.  Of note, Dr. Doylene Canard who is on call for unassigned cardiology happened to be in the department for another patient.  He has seen the EKG, and agrees that he is also concerned about ischemia.  Troponin is elevated.  Plan for CT of chest looking for dissection.  If negative will start heparin  and plan for heart catheterization this morning   Kalman Drape, MD 11/06/13 [phone]

## 2013-11-06 NOTE — ED Notes (Signed)
Cardiologist at bedside.  

## 2013-11-07 LAB — BASIC METABOLIC PANEL
BUN: 8 mg/dL (ref 6–23)
CALCIUM: 8.8 mg/dL (ref 8.4–10.5)
CHLORIDE: 103 meq/L (ref 96–112)
CO2: 23 meq/L (ref 19–32)
CREATININE: 0.98 mg/dL (ref 0.50–1.35)
GFR calc Af Amer: 84 mL/min — ABNORMAL LOW (ref >90)
GFR calc non Af Amer: 72 mL/min — ABNORMAL LOW (ref >90)
GLUCOSE: 113 mg/dL — AB (ref 70–99)
Potassium: 3.8 mEq/L (ref 3.7–5.3)
Sodium: 139 mEq/L (ref 137–147)

## 2013-11-07 LAB — TROPONIN I: Troponin I: 3.96 ng/mL (ref <0.30)

## 2013-11-07 LAB — CBC
HEMATOCRIT: 43.6 % (ref 39.0–52.0)
Hemoglobin: 14.8 g/dL (ref 13.0–17.0)
MCH: 31.4 pg (ref 26.0–34.0)
MCHC: 33.9 g/dL (ref 30.0–36.0)
MCV: 92.6 fL (ref 78.0–100.0)
Platelets: 165 10*3/uL (ref 150–400)
RBC: 4.71 MIL/uL (ref 4.22–5.81)
RDW: 13.7 % (ref 11.5–15.5)
WBC: 8.1 10*3/uL (ref 4.0–10.5)

## 2013-11-07 LAB — GLUCOSE, CAPILLARY: Glucose-Capillary: 124 mg/dL — ABNORMAL HIGH (ref 70–99)

## 2013-11-07 MED ORDER — HALOPERIDOL LACTATE 5 MG/ML IJ SOLN
0.5000 mg | Freq: Once | INTRAMUSCULAR | Status: AC
  Administered 2013-11-07: 0.5 mg via INTRAVENOUS

## 2013-11-07 MED ORDER — HALOPERIDOL LACTATE 5 MG/ML IJ SOLN: 0.5000 mg | Freq: Four times a day (QID) | INTRAMUSCULAR | Status: DC | PRN

## 2013-11-07 MED ORDER — HALOPERIDOL LACTATE 5 MG/ML IJ SOLN
0.5000 mg | Freq: Four times a day (QID) | INTRAMUSCULAR | Status: DC | PRN
  Administered 2013-11-07: 0.5 mg via INTRAMUSCULAR
  Filled 2013-11-07 (×2): qty 1

## 2013-11-07 MED FILL — Sodium Chloride IV Soln 0.9%: INTRAVENOUS | Qty: 50 | Status: AC

## 2013-11-07 NOTE — Clinical Documentation Improvement (Signed)
  ED Note states "NSTEMI" and H&P states "ACS". Please clarify the primary diagnosis. Thank you.  Possible Clinical Conditions? - NSTEMI  - Unstable angina  - Acute Coronary Syndrome  - Other Condition (please specify)   Supporting Information: - elevated /troponins : - ST depression - PTCA/DES to RCA  Thank You, Ezekiel Ina ,RN Clinical Documentation Specialist:  [phone]  [facility] Information Management

## 2013-11-07 NOTE — Progress Notes (Signed)
Subjective:  Patient denies any further chest pain or shortness of breath states feels dizzy occasionally.  Denies any palpitation  Objective:  Vital Signs in the last 24 hours: Temp:  [98.4 F (36.9 C)-99.2 F (37.3 C)] 98.8 F (37.1 C) (03/20 1251) Pulse Rate:  [74-91] 86 (03/20 1251) Resp:  [18-19] 18 (03/20 1251) BP: (131-167)/(65-96) 131/65 mmHg (03/20 1251) SpO2:  [93 %-98 %] 96 % (03/20 1251)  Intake/Output from previous day: 03/19 0701 - 03/20 0700 In: 1515 [P.O.:540; I.V.:975] Out: 1800 [Urine:1800] Intake/Output from this shift: Total I/O In: 560 [P.O.:560] Out: -   Physical Exam: Neck: no adenopathy, no carotid bruit, no JVD and supple, symmetrical, trachea midline Lungs: clear to auscultation bilaterally Heart: regular rate and rhythm, S1, S2 normal and soft systolic murmur noted no S3 gallop Abdomen: soft, non-tender; bowel sounds normal; no masses,  no organomegaly Extremities: extremities normal, atraumatic, no cyanosis or edema and right groin stable  Lab Results:  Recent Labs  11/06/13 0537 11/07/13 0410  WBC 7.4 8.1  HGB 15.7 14.8  PLT 170 165    Recent Labs  11/06/13 0537 11/07/13 0410  NA 137 139  K 4.0 3.8  CL 102 103  CO2 22 23  GLUCOSE 109* 113*  BUN 11 8  CREATININE 1.06 0.98    Recent Labs  11/06/13 1858 11/07/13 0410  TROPONINI 8.39* 3.96*   Hepatic Function Panel  Recent Labs  11/06/13 0537  PROT 6.2  ALBUMIN 3.3*  AST 22  ALT 8  ALKPHOS 42  BILITOT 0.4    Recent Labs  11/06/13 0537  CHOL 166   No results found for this basename: PROTIME,  in the last 72 hours  Imaging: Imaging results have been reviewed and Dg Chest Port 1 View  11/06/2013   CLINICAL DATA:  Chest pain and shortness of breath.  EXAM: PORTABLE CHEST - 1 VIEW  COMPARISON:  PA and lateral chest 06/28/2011.  FINDINGS: There is some subsegmental atelectasis in the lung bases. Lungs otherwise clear. Heart size normal. No pneumothorax or pleural  effusion.  IMPRESSION: No acute finding. Mild subsegmental atelectasis in the lung bases noted.   Electronically Signed   By: Inge Rise M.D.   On: 11/06/2013 03:51   Ct Angio Chest Aortic Dissect W &/or W/o  11/06/2013   CLINICAL DATA:  Chest and back pain.  Question aortic dissection.  EXAM: CT ANGIOGRAPHY CHEST, ABDOMEN AND PELVIS  TECHNIQUE: Multidetector CT imaging through the chest, abdomen and pelvis was performed using the standard protocol during bolus administration of intravenous contrast. Multiplanar reconstructed images and MIPs were obtained and reviewed to evaluate the vascular anatomy.  CONTRAST:  100 mL OMNIPAQUE IOHEXOL 350 MG/ML SOLN  COMPARISON:  CTA chest and abdomen 06/26/2007.  FINDINGS: CTA CHEST FINDINGS  There is no aortic dissection or aneurysm. The patient has a normal three-vessel arch configuration. No pulmonary embolus is identified. There is no axillary, hilar or mediastinal lymphadenopathy. No pleural or pericardial effusion. Heart size is normal. Lungs demonstrate some emphysematous change. Mild atelectasis or scarring is also seen in the right base. Minimal dependent atelectasis is noted. No lytic or sclerotic bony lesion is seen.  Review of the MIP images confirms the above findings.  CTA ABDOMEN AND PELVIS FINDINGS  There is no abdominal aortic dissection. The infrarenal abdominal aortic is minimally dilated at 3.1 cm, unchanged. Scattered areas of mural thrombus are noted and also unchanged. The patient has a right common iliac artery aneurysm measuring 3.0  cm in diameter, unchanged. Branch vessels of the aorta are patent.  The patient is status post cholecystectomy. There is a subtle focus of increased attenuation in the right hepatic lobe measuring 1.6 cm on image 85 which is unchanged. 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 Bilateral renal cysts are unchanged. The adrenal glands, spleen and pancreas appear normal. Small fat containing bilateral inguinal hernias  are noted. Diverticulosis without diverticulitis is identified. The colon is otherwise unremarkable. The stomach and small bowel appear normal. There is no lymphadenopathy or fluid. Avascular necrosis of the left femoral head without fragmentation is noted. Thoracolumbar scoliosis is present.  Review of the MIP images confirms the above findings.  IMPRESSION: Negative for aortic dissection.  There is no acute abnormality.  Minimal aneurysmal dilatation of the abdominal aorta and a 3.0 cm right common iliac artery aneurysm are unchanged. There is no hemorrhage.  Diverticulosis without diverticulitis.  Avascular necrosis of the left femoral head.   Electronically Signed   By: Inge Rise M.D.   On: 11/06/2013 05:20   Ct Angio Abd/pel W/ And/or W/o  11/06/2013   CLINICAL DATA:  Chest and back pain.  Question aortic dissection.  EXAM: CT ANGIOGRAPHY CHEST, ABDOMEN AND PELVIS  TECHNIQUE: Multidetector CT imaging through the chest, abdomen and pelvis was performed using the standard protocol during bolus administration of intravenous contrast. Multiplanar reconstructed images and MIPs were obtained and reviewed to evaluate the vascular anatomy.  CONTRAST:  100 mL OMNIPAQUE IOHEXOL 350 MG/ML SOLN  COMPARISON:  CTA chest and abdomen 06/26/2007.  FINDINGS: CTA CHEST FINDINGS  There is no aortic dissection or aneurysm. The patient has a normal three-vessel arch configuration. No pulmonary embolus is identified. There is no axillary, hilar or mediastinal lymphadenopathy. No pleural or pericardial effusion. Heart size is normal. Lungs demonstrate some emphysematous change. Mild atelectasis or scarring is also seen in the right base. Minimal dependent atelectasis is noted. No lytic or sclerotic bony lesion is seen.  Review of the MIP images confirms the above findings.  CTA ABDOMEN AND PELVIS FINDINGS  There is no abdominal aortic dissection. The infrarenal abdominal aortic is minimally dilated at 3.1 cm, unchanged.  Scattered areas of mural thrombus are noted and also unchanged. The patient has a right common iliac artery aneurysm measuring 3.0 cm in diameter, unchanged. Branch vessels of the aorta are patent.  The patient is status post cholecystectomy. There is a subtle focus of increased attenuation in the right hepatic lobe measuring 1.6 cm on image 85 which is unchanged. 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 Bilateral renal cysts are unchanged. The adrenal glands, spleen and pancreas appear normal. Small fat containing bilateral inguinal hernias are noted. Diverticulosis without diverticulitis is identified. The colon is otherwise unremarkable. The stomach and small bowel appear normal. There is no lymphadenopathy or fluid. Avascular necrosis of the left femoral head without fragmentation is noted. Thoracolumbar scoliosis is present.  Review of the MIP images confirms the above findings.  IMPRESSION: Negative for aortic dissection.  There is no acute abnormality.  Minimal aneurysmal dilatation of the abdominal aorta and a 3.0 cm right common iliac artery aneurysm are unchanged. There is no hemorrhage.  Diverticulosis without diverticulitis.  Avascular necrosis of the left femoral head.   Electronically Signed   By: Inge Rise M.D.   On: 11/06/2013 05:20    Cardiac Studies:  Assessment/Plan:  Resolving inferior wall myocardial infarction Hypertension History of CA of bladder Chronic kidney disease stage II hypercholesterolemia Remote tobacco abuse Plan Continue present  management Increase ambulation PT consult Possible discharge tomorrow if stable  LOS: 1 day    [name] N 11/07/2013, 4:58 PM

## 2013-11-07 NOTE — Evaluation (Signed)
Physical Therapy Evaluation Patient Details Name: [name] MRN: [id number] DOB: November 01, 1925 Today's Date: 11/07/2013 Time: 7829-5621 PT Time Calculation (min): 12 min  PT Assessment / Plan / Recommendation History of Present Illness  Patient is 80 year old male with past medical history significant for hypertension, remote tobacco abuse, history of CVA of bladder, came to the ER by EMS complaining of retrosternal chest pain radiating to her back associated with diaphoresis which woke him up around 1:30 AM. Patient states he had similar pain yesterday but did not seek any medical attention today pain was more severe so decided to call EMS. Denies any shortness of breath denies any palpitation lightheadedness or syncope denies any chest pain at present but complains of vague upper back pain. EKG done in the ER showed normal sinus rhythm with minimal ST elevation in lead 3 aVF and minor ST depression in and true lateral leads patient was noted to have minimally elevated troponin I. Patient denies any palpitation lightheadedness or syncope. Denies any cardiac workup in the past.  Clinical Impression  Pt admitted with cp s/p cath. Pt currently with functional limitations due to the deficits listed below (see PT Problem List). Pt will benefit from skilled PT to increase their independence and safety with mobility to allow discharge to the venue listed below.     PT Assessment  Patient needs continued PT services    Follow Up Recommendations  Home health PT;Supervision/Assistance - 24 hour (pt. likely to refuse)    Does the patient have the potential to tolerate intense rehabilitation      Barriers to Discharge        Equipment Recommendations  Rolling walker with 5" wheels (pt. likely to refuse)    Recommendations for Other Services     Frequency Min 3X/week    Precautions / Restrictions Precautions Precautions: Fall Restrictions Weight Bearing Restrictions: No   Pertinent Vitals/Pain  No c/o pain      Mobility  Bed Mobility General bed mobility comments: pt. OOB in chair upon entering Transfers Overall transfer level: Needs assistance Equipment used: None Transfers: Sit to/from Stand Sit to Stand: Supervision General transfer comment: min cues for safety Ambulation/Gait Ambulation/Gait assistance: Min assist;Supervision Ambulation Distance (Feet): 100 Feet Assistive device: Rolling walker (2 wheeled) Gait Pattern/deviations: Step-to pattern;Shuffle;Wide base of support;Trunk flexed Gait velocity: decreased Gait velocity interpretation: Below normal speed for age/gender General Gait Details: initially amb 39' with RW with supervision.  Amb 11' without device with min A. Increased shuffling with increased distance.        Exercises     PT Diagnosis: Difficulty walking;Abnormality of gait  PT Problem List: Decreased activity tolerance;Decreased balance;Decreased mobility;Decreased coordination;Decreased cognition;Decreased knowledge of use of DME;Decreased safety awareness;Decreased knowledge of precautions;Cardiopulmonary status limiting activity PT Treatment Interventions: DME instruction;Gait training;Stair training;Functional mobility training;Therapeutic activities;Therapeutic exercise;Balance training;Neuromuscular re-education;Patient/family education     PT Goals(Current goals can be found in the care plan section) Acute Rehab PT Goals Patient Stated Goal: to go home PT Goal Formulation: With patient Time For Goal Achievement: 11/14/13 Potential to Achieve Goals: Fair  Visit Information  Last PT Received On: 11/07/13 Assistance Needed: +1 History of Present Illness: Patient is 80 year old male with past medical history significant for hypertension, remote tobacco abuse, history of CVA of bladder, came to the ER by EMS complaining of retrosternal chest pain radiating to her back associated with diaphoresis which woke him up around 1:30 AM. Patient  states he had similar pain yesterday but did not seek any medical  attention today pain was more severe so decided to call EMS. Denies any shortness of breath denies any palpitation lightheadedness or syncope denies any chest pain at present but complains of vague upper back pain. EKG done in the ER showed normal sinus rhythm with minimal ST elevation in lead 3 aVF and minor ST depression in and true lateral leads patient was noted to have minimally elevated troponin I. Patient denies any palpitation lightheadedness or syncope. Denies any cardiac workup in the past.       Prior [place] expects to be discharged to:: Private residence Living Arrangements: Spouse/significant other Available Help at Discharge: Family;Available 24 hours/day Type of Home: House Home Access: Stairs to enter [employer] of Steps: 2 Entrance Stairs-Rails: Right Home Layout: One level Home Equipment: None Prior Function Level of Independence: Independent with assistive device(s) Comments: very limited activity at home; pt's wife reports pt "sat in chair all day" except for simple meal prep and restroom breaks.  Pt amb with cane in community however pt only leaves house to go to MD appts.  Pt reports 2 falls. Communication Communication: No difficulties    Cognition  Cognition Arousal/Alertness: Awake/alert Behavior During Therapy: Restless;Flat affect Overall Cognitive Status: History of cognitive impairments - at baseline Memory: Decreased short-term memory    Extremity/Trunk Assessment Upper Extremity Assessment Upper Extremity Assessment: Overall WFL for tasks assessed (R intentional tremor noted with eating) Lower Extremity Assessment Lower Extremity Assessment: Generalized weakness   Balance Balance Overall balance assessment: Needs assistance;History of Falls Sitting-balance support: No upper extremity supported;Feet supported Sitting balance-Leahy Scale:  Good Standing balance support: No upper extremity supported;During functional activity Standing balance-Leahy Scale: Poor Standing balance comment: difficulty maintaining static standing General Comments General comments (skin integrity, edema, etc.): Pt. adamantly refusing PT follow up services including HHPT and RW for safety.  Feel pt. high fall risk and needs RW and HHPT to safely d/c home.  End of Session PT - End of Session Equipment Utilized During Treatment: Gait belt Activity Tolerance: Patient tolerated treatment well Patient left: in chair;with call bell/phone within reach;with chair alarm set Nurse Communication: Mobility status  GP     Faustino Congress K 11/07/2013, 9:56 AM  Laureen Abrahams, PT, DPT [phone]

## 2013-11-07 NOTE — Progress Notes (Signed)
Pt agitated, wanted SCD's off, restless in bed.  Pt did not rest well, awoke about every hour to urinate.  Intermittent confusion, thought he was at home, occasionally taking gown and tele mx off.  Reoriented easily.  Pt insisted on walking to bathroom despite feeling "swimmy headed".  Slightly wobbly, amb to bathroom with RN using gait belt.  Bed alarm used.  Denies pain or sob.  Tele SR 80-90.  Rt groin drsg removed, bandaid applied, level 0.

## 2013-11-07 NOTE — Progress Notes (Signed)
CARDIAC REHAB PHASE I   PRE:  Rate/Rhythm: 83 SR    BP: sitting 156/64    SaO2:   MODE:  Ambulation: 50 ft   POST:  Rate/Rhythm: 104     BP: sitting 169/86     SaO2:   Pt used RW, assist x1. Able to support himself with RW but unsafe mechanics at times, esp when trying to turn and back into the recliner, got outside of RW. Pt has trembles in general. C/o lightheadedness throughout session. BP somewhat elevated. Denied CP. Pt not interested in walking any further, sts "I believe that's enough". When asked if there was anything bothering him, he sts "Only everything". Sts he only sits around the house at home and if he has to go anywhere, he uses a cane. Pt would benefit from RW and teaching on how to use it when family is present. Pt has MI book and stent card. Will have RN review NTG and Plavix with family. N/a for ex gl. Will give diet sheet.   [id number]  Josephina Shih [place] CES, ACSM 11/07/2013 9:13 AM

## 2013-11-08 MED ORDER — CLOPIDOGREL BISULFATE 75 MG PO TABS: 75.0000 mg | ORAL_TABLET | Freq: Every day | ORAL | Status: DC

## 2013-11-08 MED ORDER — METOPROLOL SUCCINATE ER 50 MG PO TB24: 50.0000 mg | ORAL_TABLET | Freq: Every day | ORAL | Status: DC

## 2013-11-08 MED ORDER — ASPIRIN 81 MG PO TBEC: 81.0000 mg | DELAYED_RELEASE_TABLET | Freq: Every day | ORAL | Status: DC

## 2013-11-08 MED ORDER — NITROGLYCERIN 0.4 MG SL SUBL: 0.4000 mg | SUBLINGUAL_TABLET | SUBLINGUAL | Status: AC | PRN

## 2013-11-08 MED ORDER — ATORVASTATIN CALCIUM 40 MG PO TABS: 40.0000 mg | ORAL_TABLET | Freq: Every day | ORAL | Status: DC

## 2013-11-08 NOTE — Discharge Summary (Signed)
[name], [name]                ACCOUNT NO.:  [id number]  MEDICAL RECORD NO.:  [id number]  LOCATION:  [id number]                        FACILITY:  [facility]  PHYSICIAN:  Allegra Lai. Terrence Dupont, M.D. DATE OF BIRTH:  02-May-1926  DATE OF ADMISSION:  11/06/2013 DATE OF DISCHARGE:                              DISCHARGE SUMMARY   ADMITTING DIAGNOSES: 1. Acute coronary syndrome. 2. Chest pain/back pain, rule out aortic dissection. 3. Hypertension. 4. Remote tobacco abuse. 5. History of cancer of bladder. 6. Chronic kidney disease, stage II. 7. Hypercholesteremia.  DISCHARGE DIAGNOSES: 1. Status post inferior wall myocardial infarction status post     percutaneous coronary intervention to mid right coronary artery     with excellent results. 2. Hypertension. 3. Hypercholesteremia. 4. Remote tobacco abuse. 5. History of cancer of the bladder. 6. Hypercholesteremia. 7. Chronic kidney disease, stage II.  DISCHARGE HOME MEDICATIONS: 1. Aspirin 81 mg 1 tablet daily. 2. Atorvastatin 40 mg 1 tablet daily. 3. Clopidogrel 75 mg 1 tablet daily with breakfast. 4. Nitrostat sublingual 0.4 mg use as directed. 5. Metoprolol succinate 50 mg 1 tablet daily. 6. Diovan 320 mg half tablet in a.m. and half tablet in p.m. as     before. 7. Omeprazole 20 mg 1 capsule daily.  DIET:  Low salt, low cholesterol.  ACTIVITY:  Increase activity slowly as tolerated.  DISCHARGE INSTRUCTIONS:  Post cardiac cath and PTCA stent instructions have been given.  FOLLOWUP:  Follow up with me in 1 week.  CONDITION AT DISCHARGE:  Stable.  The patient will be scheduled for phase 2 cardiac rehab as outpatient.  BRIEF HISTORY AND HOSPITAL COURSE:  [name] is 80 year old male with past medical history significant for hypertension, remote tobacco abuse, history of CA of bladder, he came to the ER by EMS complaining of retrosternal chest pain radiating to his back associated with diaphoresis which woke him up around 1:30  a.m.  The patient states he had similar pain yesterday but did not seek any medical attention. Today, pain was more severe, so decided to call EMS.  The patient denies any shortness of breath.  Denies any palpitation, lightheadedness, or syncope.  Denies any chest pain at present but complains of vague upper back pain.  EKG done in the ER showed normal sinus rhythm with minimal ST elevation in lead II, III, aVF and minor ST depression in posterior lateral leads.  The patient was noted to have minimally elevated troponin I.  The patient denies any palpitation, lightheadedness, or syncope.  Denies any cardiac workup in the past.  PHYSICAL EXAMINATION:  VITAL SIGNS:  His blood pressure was 138/80, pulse was 79, he was afebrile. HEENT:  Conjunctivae pink. NECK:  Supple.  No JVD.  No bruit. LUNGS:  Clear to auscultation without rhonchi or rales. CARDIOVASCULAR EXAM:  S1, S2 was normal.  There was soft systolic murmur.  No S3 gallop. ABDOMEN:  Soft.  Bowel sounds were present.  Nontender. EXTREMITIES:  There is no clubbing, cyanosis, or edema.  Bilateral pulses were equal in arms and legs.  LABORATORY DATA:  Sodium was 141, potassium 4.8, BUN 12, creatinine 1.30, glucose was 118.  Troponin I, first set was 0.43,  0.78, 1.27, 8.39 and repeat was 3.96 which is trending down.  Cholesterol was 166, LDL 108, triglycerides 110, HDL was 36.  Hemoglobin was 16.3, hematocrit 47.5, white count of 6.7.  DIAGNOSTIC DATA:  Repeat EKG showed normal sinus rhythm with evolving inferior wall myocardial infarction.  His CT angio done in the ED showed no evidence of aortic dissection.  There was minimal aneurysmal dilatation of the abdominal aorta and 3-cm right common iliac artery aneurysm was unchanged.  BRIEF HOSPITAL COURSE:  The patient was admitted to step-down unit and was taken to cardiac cath lab and underwent left cath and PTCA stenting to mid RCA with excellent results as per procedure report.   Post procedure, the patient did not have any episodes of chest pain during the hospital stay.  His groin is stable with no evidence of hematoma or bruit.  The patient's phase 1 cardiac rehab was called.  The patient has been ambulating in hallway, without any problems.  His repeat renal function, his creatinine has come down to 0.98.  His blood sugar remains minimally elevated between 109 to 113.  The patient will be discharged home on above medications and will be followed up in my office in 1 week.  We will check hemoglobin A1c as outpatient, and the patient has been advised to refrain from sweets.     Allegra Lai. Terrence Dupont, M.D.     MNH/MEDQ  D:  11/08/2013  T:  11/08/2013  Job:  [id number]

## 2013-11-08 NOTE — Discharge Instructions (Signed)
Acute Coronary Syndrome °Acute coronary syndrome (ACS) is an urgent problem in which the blood and oxygen supply to the heart is critically deficient. ACS requires hospitalization because one or more coronary arteries may be blocked. °ACS represents a range of conditions including: °· Previous angina that is now unstable, lasts longer, happens at rest, or is more intense. °· A heart attack, with heart muscle cell injury and death. °There are three vital coronary arteries that supply the heart muscle with blood and oxygen so that it can pump blood effectively. If blockages to these arteries develop, blood flow to the heart muscle is reduced. If the heart does not get enough blood, angina may occur as the first warning sign. °SYMPTOMS  °· The most common signs of angina include: °· Tightness or squeezing in the chest. °· Feeling of heaviness on the chest. °· Discomfort in the arms, neck, or jaw. °· Shortness of breath and nausea. °· Cold, wet skin. °· Angina is usually brought on by physical effort or excitement which increase the oxygen needs of the heart. These states increase the blood flow needs of the heart beyond what can be delivered. °TREATMENT  °· Medicines to help discomfort may include nitroglycerin (nitro) in the form of tablets or a spray for rapid relief, or longer-acting forms such as cream, patches, or capsules. (Be aware that there are many side effects and possible interactions with other drugs). °· Other medicines may be used to help the heart pump better. °· Procedures to open blocked arteries including angioplasty or stent placement to keep the arteries open. °· Open heart surgery may be needed when there are many blockages or they are in critical locations that are best treated with surgery. °HOME CARE INSTRUCTIONS  °· Avoid smoking. °· Take one baby or adult aspirin daily, if your caregiver advises. This helps reduce the risk of a heart attack. °· It is very important that you follow the angina  treatment prescribed by your caregiver. Make arrangements for proper follow-up care. °· Eat a heart healthy diet with salt and fat restrictions as advised. °· Regular exercise is good for you as long as it does not cause discomfort. Do not begin any new type of exercise until you check with your caregiver. °· If you are overweight, you should lose weight. °· Try to maintain normal blood lipid levels. °· Keep your blood pressure under control as recommended by your caregiver. °· You should tell your caregiver right away about any increase in the severity or frequency of your chest discomfort or angina attacks. When you have angina, you should stop what you are doing and sit down. This may bring relief in 3 to 5 minutes. If your caregiver has prescribed nitro, take it as directed. °· If your caregiver has given you a follow-up appointment, it is very important to keep that appointment. Not keeping the appointment could result in a chronic or permanent injury, pain, and disability. If there is any problem keeping the appointment, you must call back to this facility for assistance. °SEEK IMMEDIATE MEDICAL CARE IF:  °· You develop nausea, vomiting, or shortness of breath. °· You feel faint, lightheaded, or pass out. °· Your chest discomfort gets worse. °· You are sweating or experience sudden profound fatigue. °· You do not get relief of your chest pain after 3 doses of nitro. °· Your discomfort lasts longer than 15 minutes. °MAKE SURE YOU:  °· Understand these instructions. °· Will watch your condition. °· Will get help   right away if you are not doing well or get worse. °Document Released: 08/07/2005 Document Revised: 10/30/2011 Document Reviewed: 03/10/2008 °ExitCare® Patient Information ©2014 [employer]. °Coronary Angiography with Stent °Coronary angiography with stent placement is a procedure to widen or open a narrow blood vessel of the heart (coronary artery). When a coronary artery becomes partially blocked, it  decreases blood flow to that area. This may lead to chest pain or a heart attack (myocardial infarction). Arteries may become blocked by cholesterol buildup (plaque) in the lining or wall.  °A stent is a small piece of metal that looks like a mesh or a spring. Stent placement may be done right after a coronary angiography in which a blocked artery is found or as a treatment for a heart attack.  °LET YOUR HEALTH CARE PROVIDER KNOW ABOUT: °· Any allergies you have.   °· All medicines you are taking, including vitamins, herbs, eye drops, creams, and over-the-counter medicines.   °· Previous problems you or members of your family have had with the use of anesthetics.   °· Any blood disorders you have.   °· Previous surgeries you have had.   °· Medical conditions you have. °RISKS AND COMPLICATIONS °Generally, coronary angiography with stent is a safe procedure. However, as with any procedure, complications can occur. Possible complications include:  °· Damage to the heart or its blood vessels.   °· A return of blockage.   °· Bleeding at the site.   °· Blood clot in another part of the body.   °· Kidney injury.   °· Allergic reaction to the dye or contrast used.   °BEFORE THE PROCEDURE °· Do not eat or drink anything for 6 hours before the procedure.   °· Ask your health care provider if medicines can be taken with a sip of water.   °· Your health care provider will make sure you understand the procedure and the risks and potential complications associated with the procedure.   °PROCEDURE °· You may be given a medicine to help you relax before and during the procedure (sedative). This medicine will be given through an IV tube that is put into one of your veins.   °· The area where the catheter will be inserted is shaved and cleaned. This is usually done in the groin but may be done in the fold of your arm (near your elbow) or in the wrist.    °· A medicine will be given to numb the area where the catheter will be inserted  (local anesthetic).   °· The catheter is inserted into an artery using a guide wire. A type of X-ray (fluoroscopy) is used to help guide the catheter to the opening of the blocked artery.   °· A dye is then injected into the catheter, and X-rays are taken. The dye helps to show where any narrowing or blockages are located in the heart arteries.   °· A tiny wire is guided to the blocked spot, and a balloon is inflated to make the artery wider. The stent is expanded and crushes the plaque into the wall of the vessel. The stent holds the area open like a scaffolding and improves the blood flow.   °· Sometimes the artery may be made wider using a laser or other tools to remove plaque.   °· When the blood flow is better, the catheter is removed. The lining of the artery will grow over the stent, which stays where it was placed.   °AFTER THE PROCEDURE °· If the procedure is done through the leg, you will be kept in bed lying flat for about 6   hours. You will be instructed to not bend or cross your legs.   °· The insertion site will be checked frequently.   °· The pulse in your feet or wrist will be checked frequently.   °· Additional blood tests, X-rays, and electrocardiography may be done. °Document Released: 02/11/2003 Document Revised: 05/28/2013 Document Reviewed: 02/13/2013 °ExitCare® Patient Information ©2014 [employer]. ° °

## 2013-11-08 NOTE — Progress Notes (Addendum)
Pt became very confused at the beginning of the shift. The pt's wife [name] was called. She came up to the stay with the pt overnight. The pt continued to be confused & agitated. The pt was pacing the floors of the unit asking to speak to the police. His MD Harwani was notified & ordered 0.5 mg of Haldol. After receiving the haldol the pt continued to be agitated & still wanted to speak to the police. The pt did not want to stay in his room & sat out in the hallway saying he was waiting for he police. Security and the police came up to talk with the pt & convinced him to lay in the bed.The MD Harwani was notified of this and prescribed another 0.5mg  of Haldol. Will continue to monitor the pt. Hoover Brunette

## 2013-11-08 NOTE — Discharge Summary (Signed)
  Discharge summary dictated on 11/08/2013 dictation number is [id number]

## 2013-11-08 NOTE — Progress Notes (Signed)
CARDIAC REHAB PHASE I   PRE:  Rate/Rhythm: 74 sinus  BP:  Supine: 116/56       MODE:  Ambulation: 200 ft   POST:  Rate/Rhythem: 78 sinus  BP:  Sitting: 122/60   SaO2: 95 RA  Pt ambulated 200 ft with assist x1 using rolling walker.  Pt tolerated walk well, was still a little dizzy headed.  Pt had no f/u education questions, and was eager to get home.  Pt returned to bedside after walk with call bell in reach and family in room. Alberteen Sam, MA, ACSM RCEP [phone]  Clotilde Dieter

## 2013-11-08 NOTE — Progress Notes (Signed)
   CARE MANAGEMENT NOTE 11/08/2013  Patient:  [name], [name]   Account Number:  [id number]  Date Initiated:  11/07/2013  Documentation initiated by:  [facility]  Subjective/Objective Assessment:   80 year old male with PMHx significant for hypertension, remote tobacco abuse, Hx of CVA of bladder, came to the ER by EMS complaining of retrosternal chest pain radiating to her back  with diaphoresis//Home with spouse     Action/Plan:   Left cardiacCath/PTCA stenting//Offer HHPT choice; order rolling walker   Anticipated DC Date:  11/07/2013   Anticipated DC Plan:  [place]  CM consult      [facility] Choice  [place]   Choice offered to / List presented to:  C-3 Spouse   DME arranged  Vassie Moselle      DME agency  [place] arranged  [place].   Status of service:  Completed, signed off Medicare Important Message given?   (If response is "NO", the following Medicare IM given date fields will be blank) Date Medicare IM given:   Date Additional Medicare IM given:    Discharge Disposition:  [place]  Per UR Regulation:    If discussed at Long Length of Stay Meetings, dates discussed:    Comments:  11/08/13 14:05 CM notified [facility] liason, [place] via text F2F completed for HHPT/aide.  No other CM needs were communicated.  Mariane Masters, BSN, [place] [phone].   11/07/13 [place], RN, BSN, [employer] [phone] Spoke with pt wife [name]) via telephone regarding discharge planning for [facility]. Read pt list of home health agencies to choose from.  Pt chose [facility] to render services. Hansel Feinstein, RN of [facility] notified.  DME needs identified at this time include rolling walker.  Jermaine of [facility] notified to deliver to pt room prior to d/c home.

## 2013-12-19 ENCOUNTER — Other Ambulatory Visit: Payer: Self-pay

## 2013-12-19 MED ORDER — METOPROLOL SUCCINATE ER 50 MG PO TB24: 50.0000 mg | ORAL_TABLET | Freq: Every day | ORAL | Status: DC

## 2014-01-14 ENCOUNTER — Encounter: Payer: Self-pay | Admitting: Gastroenterology

## 2014-01-21 ENCOUNTER — Encounter (HOSPITAL_COMMUNITY): Payer: Self-pay | Admitting: *Deleted

## 2014-01-21 ENCOUNTER — Ambulatory Visit (INDEPENDENT_AMBULATORY_CARE_PROVIDER_SITE_OTHER): Payer: Medicare Other | Admitting: Physician Assistant

## 2014-01-21 ENCOUNTER — Other Ambulatory Visit (INDEPENDENT_AMBULATORY_CARE_PROVIDER_SITE_OTHER): Payer: Medicare Other

## 2014-01-21 ENCOUNTER — Encounter: Payer: Self-pay | Admitting: Physician Assistant

## 2014-01-21 ENCOUNTER — Encounter (HOSPITAL_COMMUNITY): Payer: Self-pay | Admitting: Pharmacy Technician

## 2014-01-21 VITALS — BP 130/70 | HR 80 | Ht 69.0 in | Wt 194.4 lb

## 2014-01-21 DIAGNOSIS — K921 Melena: Secondary | ICD-10-CM

## 2014-01-21 LAB — CBC WITH DIFFERENTIAL/PLATELET
Basophils Absolute: 0 10*3/uL (ref 0.0–0.1)
Basophils Relative: 0.4 % (ref 0.0–3.0)
EOS PCT: 1.4 % (ref 0.0–5.0)
Eosinophils Absolute: 0.1 10*3/uL (ref 0.0–0.7)
HCT: 48.9 % (ref 39.0–52.0)
Hemoglobin: 16.2 g/dL (ref 13.0–17.0)
Lymphocytes Relative: 23.5 % (ref 12.0–46.0)
Lymphs Abs: 2.2 10*3/uL (ref 0.7–4.0)
MCHC: 33.2 g/dL (ref 30.0–36.0)
MCV: 95.6 fl (ref 78.0–100.0)
MONOS PCT: 4.5 % (ref 3.0–12.0)
Monocytes Absolute: 0.4 10*3/uL (ref 0.1–1.0)
NEUTROS PCT: 70.2 % (ref 43.0–77.0)
Neutro Abs: 6.6 10*3/uL (ref 1.4–7.7)
PLATELETS: 283 10*3/uL (ref 150.0–400.0)
RBC: 5.11 Mil/uL (ref 4.22–5.81)
RDW: 14.1 % (ref 11.5–15.5)
WBC: 9.4 10*3/uL (ref 4.0–10.5)

## 2014-01-21 LAB — BASIC METABOLIC PANEL
BUN: 10 mg/dL (ref 6–23)
CO2: 25 meq/L (ref 19–32)
Calcium: 9 mg/dL (ref 8.4–10.5)
Chloride: 100 mEq/L (ref 96–112)
Creatinine, Ser: 1.1 mg/dL (ref 0.4–1.5)
GFR: 65.33 mL/min (ref >60.00)
Glucose, Bld: 131 mg/dL — ABNORMAL HIGH (ref 70–99)
POTASSIUM: 4.3 meq/L (ref 3.5–5.1)
SODIUM: 135 meq/L (ref 135–145)

## 2014-01-21 MED ORDER — OMEPRAZOLE 40 MG PO CPDR: 40.0000 mg | DELAYED_RELEASE_CAPSULE | Freq: Every day | ORAL | Status: DC

## 2014-01-21 NOTE — Patient Instructions (Signed)
Stay on the Plavix and aspirin medication. Take Prilosec 40 mg daily , prescription sent to [place]. You have been scheduled for an endoscopy with propofol. Please follow written instructions given to you at your visit today. If you use inhalers (even only as needed), please bring them with you on the day of your procedure. Your physician has requested that you go to www.startemmi.com and enter the access code given to you at your visit today. This web site gives a general overview about your procedure. However, you should still follow specific instructions given to you by our office regarding your preparation for the procedure.

## 2014-01-21 NOTE — Progress Notes (Signed)
Subjective:    Patient ID: [name], male    DOB: 1925-09-01, 80 y.o.   MRN: [id number]  HPI  [name] is a pleasant 80 year old white male known to Dr. Deatra Ina from prior colonoscopy. He had last had colonoscopy in 2007 done for family history of colon cancer in his mother area this was pertinent for diverticulosis and one small polyp which was adenomatous. Patient comes in today with complaints of very dark stools over the past 2-3 weeks and states that he had noticed a blood tinge around his stool in the commode. He has chronic problems with mouth constipation and says he has not had a bowel movement over the past couple of days but the last bowel movement was very dark. He is no complaints of abdominal pain. His appetite has been fine, his weight has been stable he may have had some vague nausea recently, no vomiting. He is maintained on Prilosec 20 mg by mouth daily for reflux symptoms . Patient had a hospital admission in March of 2015 with an acute MI and underwent a PCI to the RCA per Dr. Terrence Dupont and has been placed on aspirin and Plavix since. Hemoglobin in March of 2015 was 14.8 and has not been repeated since. Other medical problems include chronic kidney disease, hypertension, history of choledocholithiasis and bladder cancer. On further questioning patient has not been taking any NSAIDs but says he does take an extra whole aspirin every couple of days for arthritic symptoms.    Review of Systems  Constitutional: Positive for fatigue.  HENT: Negative.   Eyes: Negative.   Respiratory: Negative.   Cardiovascular: Negative.   Gastrointestinal: Positive for blood in stool.  Endocrine: Negative.   Genitourinary: Negative.   Musculoskeletal: Negative.   Skin: Negative.   Allergic/Immunologic: Negative.   Neurological: Negative.   Hematological: Negative.   Psychiatric/Behavioral: Negative.    Outpatient Prescriptions Prior to Visit  Medication Sig Dispense Refill  .  nitroGLYCERIN (NITROSTAT) 0.4 MG SL tablet Place 1 tablet (0.4 mg total) under the tongue every 5 (five) minutes x 3 doses as needed for chest pain.  25 tablet  12  . omeprazole (PRILOSEC) 20 MG capsule Take 20 mg by mouth daily.      . valsartan (DIOVAN) 320 MG tablet Take 160 mg by mouth every morning.       Marland Kitchen aspirin EC 81 MG EC tablet Take 1 tablet (81 mg total) by mouth daily.  30 tablet  3  . atorvastatin (LIPITOR) 40 MG tablet Take 1 tablet (40 mg total) by mouth daily at 6 PM.  30 tablet  3  . clopidogrel (PLAVIX) 75 MG tablet Take 1 tablet (75 mg total) by mouth daily with breakfast.  30 tablet  11  . metoprolol succinate (TOPROL-XL) 50 MG 24 hr tablet Take 1 tablet (50 mg total) by mouth daily. Take with or immediately following a meal.  90 tablet  0   Facility-Administered Medications Prior to Visit  Medication Dose Route Frequency Provider Last Rate Last Dose  . mitomycin (MUTAMYCIN) chemo injection 40 mg  40 mg Bladder Instillation Once        Allergies  Allergen Reactions  . Cephalexin Other (See Comments)    Bloody loose stools   Patient Active Problem List   Diagnosis Date Noted  . Acute coronary syndrome 11/06/2013  . History of anesthesia reaction 02/27/2012  . CIS (carcinoma in situ of bladder) 08/03/2011  . Hyponatremia 08/02/2011  . Bladder cancer s/p  resection 12/6 07/27/2011  . Hypocalcemia 02/21/2011  . DYSPHAGIA, OROPHARYNGEAL PHASE 07/13/2010  . EDEMA- LOCALIZED 03/22/2010  . PALPITATIONS 03/22/2010  . BENIGN POSITIONAL VERTIGO 10/27/2009  . HYPOTENSION, ORTHOSTATIC 10/27/2009  . TORTICOLLIS 10/27/2009  . HYPERTROPHY PROSTATE W/UR OBST & OTH LUTS 06/03/2009  . FASTING HYPERGLYCEMIA 11/24/2008  . GERD 10/06/2008  . 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 ACTION TREMOR 08/12/2007  . MEMORY LOSS 08/12/2007  . HYPERLIPIDEMIA 10/07/2006  . HYPERTENSION 10/07/2006  . DIVERTICULOSIS, COLON 10/07/2006  . COLONIC POLYPS 11/03/2005    History  Substance Use Topics  . Smoking status: Former Smoker -- 1.00 packs/day for 31 years    Types: Cigarettes    Quit date: 08/21/1973  . Smokeless tobacco: Never Used     Comment: smoked  1944-1975 , up to 1 ppd  . Alcohol Use: 1.2 oz/week    2 Glasses of wine per week     Comment: 11/06/2013 "glass of wine maybe twice/wk"   family history includes Breast cancer in his maternal grandmother; Colon cancer (age of onset: 73) in his mother; Heart attack in his maternal grandfather; Heart attack (age of onset: 41) in his father; Heart failure in his sister; Hypertension in his mother. There is no history of Diabetes.     Objective:   Physical Exam  well-developed elderly white male in no acute distress, pleasant blood pressure 130/70 pulse 80 height 5 foot 9 weight 194, HEENT; nontraumatic normocephalic EOMI PERRLA sclera anicteric, Supple; no JVD, Cardiovascular; regular rate and rhythm with S1-S2 pulmonary clear bilaterally, Abdomen; soft nontender nondistended bowel sounds are active there is no palpable mass or hepatosplenomegaly, Rectal; exam Brown stool Hemoccult negative, Ext; no clubbing cyanosis or edema skin warm and dry, Psych ;mood and affect appropriate        Assessment & Plan:  #42  80 year old male with complaints of melena x2-3 weeks. This is in the setting of recent initiation of aspirin and Plavix after an MI in March of 2015. Patient is Hemoccult-negative today. Cannot rule out intermittent blood loss and would be concerned about an upper GI source with aspirin use and Plavix. #2 family history of colon cancer #3 history of adenomatous colon polyp 2007 #4 diverticulosis #5 history of bladder cancer #6 GERD #7 status post MI March 2015 with PCI to the RCA #8 chronic kidney disease  Plan; CBC with differential today and be met Increase Prilosec to 40 mg by mouth every morning prescription sent Patient was asked to stop taking an extra whole aspirin and to use  Tylenol as needed for arthritic symptoms We'll go ahead and schedule for upper endoscopy with Dr. Deatra Ina at [facility]. Procedure was discussed in detail with the patient and he is agreeable to proceed. Procedure will be done with the patient on aspirin and Plavix given recent MI, and if his hemoglobin is normal we may opt to cancel.

## 2014-01-21 NOTE — Progress Notes (Signed)
Reviewed and agree with management. Brahim Dolman D. Brionna Romanek, M.D., FACG  

## 2014-01-29 ENCOUNTER — Encounter (HOSPITAL_COMMUNITY): Payer: Self-pay

## 2014-01-29 ENCOUNTER — Ambulatory Visit (HOSPITAL_COMMUNITY)
Admission: RE | Admit: 2014-01-29 | Discharge: 2014-01-29 | Disposition: A | Discharge status: Home / Self Care | Payer: Medicare Other | Source: Ambulatory Visit | Attending: Gastroenterology | Admitting: Gastroenterology

## 2014-01-29 ENCOUNTER — Encounter (HOSPITAL_COMMUNITY)
Admission: RE | Disposition: A | Discharge status: Home / Self Care | Payer: Self-pay | Source: Ambulatory Visit | Attending: Gastroenterology

## 2014-01-29 ENCOUNTER — Encounter (HOSPITAL_COMMUNITY): Payer: Medicare Other | Admitting: Anesthesiology

## 2014-01-29 ENCOUNTER — Ambulatory Visit (HOSPITAL_COMMUNITY): Payer: Medicare Other | Admitting: Anesthesiology

## 2014-01-29 DIAGNOSIS — I2510 Atherosclerotic heart disease of native coronary artery without angina pectoris: Secondary | ICD-10-CM | POA: Insufficient documentation

## 2014-01-29 DIAGNOSIS — Z881 Allergy status to other antibiotic agents status: Secondary | ICD-10-CM | POA: Insufficient documentation

## 2014-01-29 DIAGNOSIS — I739 Peripheral vascular disease, unspecified: Secondary | ICD-10-CM | POA: Insufficient documentation

## 2014-01-29 DIAGNOSIS — I6529 Occlusion and stenosis of unspecified carotid artery: Secondary | ICD-10-CM | POA: Insufficient documentation

## 2014-01-29 DIAGNOSIS — Z7902 Long term (current) use of antithrombotics/antiplatelets: Secondary | ICD-10-CM | POA: Insufficient documentation

## 2014-01-29 DIAGNOSIS — I10 Essential (primary) hypertension: Secondary | ICD-10-CM | POA: Insufficient documentation

## 2014-01-29 DIAGNOSIS — I252 Old myocardial infarction: Secondary | ICD-10-CM | POA: Insufficient documentation

## 2014-01-29 DIAGNOSIS — Z8551 Personal history of malignant neoplasm of bladder: Secondary | ICD-10-CM | POA: Insufficient documentation

## 2014-01-29 DIAGNOSIS — Z79899 Other long term (current) drug therapy: Secondary | ICD-10-CM | POA: Insufficient documentation

## 2014-01-29 DIAGNOSIS — K921 Melena: Secondary | ICD-10-CM

## 2014-01-29 DIAGNOSIS — E871 Hypo-osmolality and hyponatremia: Secondary | ICD-10-CM | POA: Insufficient documentation

## 2014-01-29 DIAGNOSIS — K222 Esophageal obstruction: Secondary | ICD-10-CM | POA: Insufficient documentation

## 2014-01-29 DIAGNOSIS — Z860100 Personal history of colon polyps, unspecified: Secondary | ICD-10-CM | POA: Insufficient documentation

## 2014-01-29 DIAGNOSIS — Z7982 Long term (current) use of aspirin: Secondary | ICD-10-CM | POA: Insufficient documentation

## 2014-01-29 DIAGNOSIS — Z9861 Coronary angioplasty status: Secondary | ICD-10-CM | POA: Insufficient documentation

## 2014-01-29 DIAGNOSIS — K219 Gastro-esophageal reflux disease without esophagitis: Secondary | ICD-10-CM | POA: Insufficient documentation

## 2014-01-29 DIAGNOSIS — Z87891 Personal history of nicotine dependence: Secondary | ICD-10-CM | POA: Insufficient documentation

## 2014-01-29 HISTORY — PX: ESOPHAGOGASTRODUODENOSCOPY (EGD) WITH PROPOFOL: SHX5813

## 2014-01-29 SURGERY — ESOPHAGOGASTRODUODENOSCOPY (EGD) WITH PROPOFOL
Anesthesia: Monitor Anesthesia Care

## 2014-01-29 MED ORDER — SODIUM CHLORIDE 0.9 % IV SOLN
INTRAVENOUS | Status: DC
Start: 2014-01-29 — End: 2014-01-29

## 2014-01-29 MED ORDER — LIDOCAINE HCL (PF) 2 % IJ SOLN
INTRAMUSCULAR | Status: DC | PRN
  Administered 2014-01-29: 50 mg via INTRADERMAL

## 2014-01-29 MED ORDER — ESMOLOL HCL 10 MG/ML IV SOLN
INTRAVENOUS | Status: DC | PRN
  Administered 2014-01-29: 10 mg via INTRAVENOUS

## 2014-01-29 MED ORDER — ONDANSETRON HCL 4 MG/2ML IJ SOLN
INTRAMUSCULAR | Status: DC | PRN
  Administered 2014-01-29 (×2): 2 mg via INTRAVENOUS

## 2014-01-29 MED ORDER — ONDANSETRON HCL 4 MG/2ML IJ SOLN
INTRAMUSCULAR | Status: AC
  Filled 2014-01-29: qty 2

## 2014-01-29 MED ORDER — PROPOFOL 10 MG/ML IV BOLUS
INTRAVENOUS | Status: AC
  Filled 2014-01-29: qty 20

## 2014-01-29 MED ORDER — LACTATED RINGERS IV SOLN: INTRAVENOUS | Status: DC | PRN

## 2014-01-29 MED ORDER — LIDOCAINE HCL (CARDIAC) 20 MG/ML IV SOLN
INTRAVENOUS | Status: AC
  Filled 2014-01-29: qty 5

## 2014-01-29 MED ORDER — PROPOFOL 10 MG/ML IV BOLUS
INTRAVENOUS | Status: DC | PRN
  Administered 2014-01-29: 30 mg via INTRAVENOUS

## 2014-01-29 MED ORDER — PROPOFOL INFUSION 10 MG/ML OPTIME
INTRAVENOUS | Status: DC | PRN
  Administered 2014-01-29: 100 ug/kg/min via INTRAVENOUS

## 2014-01-29 MED ORDER — GLYCOPYRROLATE 0.2 MG/ML IJ SOLN
INTRAMUSCULAR | Status: AC
  Filled 2014-01-29: qty 1

## 2014-01-29 MED ORDER — LACTATED RINGERS IV SOLN
INTRAVENOUS | Status: DC
  Administered 2014-01-29: 09:00:00 via INTRAVENOUS

## 2014-01-29 MED ORDER — FENTANYL CITRATE 0.05 MG/ML IJ SOLN
INTRAMUSCULAR | Status: DC | PRN
  Administered 2014-01-29 (×2): 50 ug via INTRAVENOUS

## 2014-01-29 MED ORDER — DEXAMETHASONE SODIUM PHOSPHATE 10 MG/ML IJ SOLN
INTRAMUSCULAR | Status: AC
  Filled 2014-01-29: qty 1

## 2014-01-29 MED ORDER — DEXAMETHASONE SODIUM PHOSPHATE 10 MG/ML IJ SOLN
INTRAMUSCULAR | Status: DC | PRN
  Administered 2014-01-29: 10 mg via INTRAVENOUS

## 2014-01-29 MED ORDER — FENTANYL CITRATE 0.05 MG/ML IJ SOLN
INTRAMUSCULAR | Status: AC
Start: 2014-01-29 — End: 2014-01-29
  Filled 2014-01-29: qty 2

## 2014-01-29 SURGICAL SUPPLY — 15 items

## 2014-01-29 NOTE — Op Note (Signed)
[facility] [place], [address]   ENDOSCOPY PROCEDURE REPORT  PATIENT: Benjamin, Burns  MR#: [id number] BIRTHDATE: Aug 29, 1925 , 88  yrs. old GENDER: Male ENDOSCOPIST: Inda Castle, MD REFERRED BY:  Unice Cobble, M.D. PROCEDURE DATE:  01/29/2014 PROCEDURE:  EGD, diagnostic ASA CLASS:     Class II INDICATIONS:  Melena. MEDICATIONS: MAC sedation, administered by CRNA TOPICAL ANESTHETIC:  DESCRIPTION OF PROCEDURE: After the risks benefits and alternatives of the procedure were thoroughly explained, informed consent was obtained.  The Pentax Gastroscope [id number] endoscope was introduced through the mouth and advanced to the third portion of the duodenum. Without limitations.  The instrument was slowly withdrawn as the mucosa was fully examined.      In early esophageal stricture was seen at the GE junction.  The 9 mm gastroscope easily traversed the stricture.   The remainder of the upper endoscopy exam was otherwise normal.  Retroflexed views revealed no abnormalities.     The scope was then withdrawn from the patient and the procedure completed.  COMPLICATIONS: There were no complications. ENDOSCOPIC IMPRESSION: 1.   In early esophageal stricture was seen at the GE junction.  The 9 mm gastroscope easily traversed the stricture 2.   The remainder of the upper endoscopy exam was otherwise normal  RECOMMENDATIONS: 1.  repeat CBC 2.  stool Hemoccults REPEAT EXAM:  eSigned:  Inda Castle, MD 01/29/2014 12:00 PM   CC:

## 2014-01-29 NOTE — H&P (View-Only) (Signed)
 Subjective:    Patient ID: [name], male    DOB: 07/03/1926, 80 y.o.   MRN: [id number]  HPI  [name] is a pleasant 80-year-old white male known to Dr. Kaplan from prior colonoscopy. He had last had colonoscopy in 2007 done for family history of colon cancer in his mother area this was pertinent for diverticulosis and one small polyp which was adenomatous. Patient comes in today with complaints of very dark stools over the past 2-3 weeks and states that he had noticed a blood tinge around his stool in the commode. He has chronic problems with mouth constipation and says he has not had a bowel movement over the past couple of days but the last bowel movement was very dark. He is no complaints of abdominal pain. His appetite has been fine, his weight has been stable he may have had some vague nausea recently, no vomiting. He is maintained on Prilosec 20 mg by mouth daily for reflux symptoms . Patient had a hospital admission in March of 2015 with an acute MI and underwent a PCI to the RCA per Dr. Harwani and has been placed on aspirin and Plavix since. Hemoglobin in March of 2015 was 14.8 and has not been repeated since. Other medical problems include chronic kidney disease, hypertension, history of choledocholithiasis and bladder cancer. On further questioning patient has not been taking any NSAIDs but says he does take an extra whole aspirin every couple of days for arthritic symptoms.    Review of Systems  Constitutional: Positive for fatigue.  HENT: Negative.   Eyes: Negative.   Respiratory: Negative.   Cardiovascular: Negative.   Gastrointestinal: Positive for blood in stool.  Endocrine: Negative.   Genitourinary: Negative.   Musculoskeletal: Negative.   Skin: Negative.   Allergic/Immunologic: Negative.   Neurological: Negative.   Hematological: Negative.   Psychiatric/Behavioral: Negative.    Outpatient Prescriptions Prior to Visit  Medication Sig Dispense Refill  .  nitroGLYCERIN (NITROSTAT) 0.4 MG SL tablet Place 1 tablet (0.4 mg total) under the tongue every 5 (five) minutes x 3 doses as needed for chest pain.  25 tablet  12  . omeprazole (PRILOSEC) 20 MG capsule Take 20 mg by mouth daily.      . valsartan (DIOVAN) 320 MG tablet Take 160 mg by mouth every morning.       . aspirin EC 81 MG EC tablet Take 1 tablet (81 mg total) by mouth daily.  30 tablet  3  . atorvastatin (LIPITOR) 40 MG tablet Take 1 tablet (40 mg total) by mouth daily at 6 PM.  30 tablet  3  . clopidogrel (PLAVIX) 75 MG tablet Take 1 tablet (75 mg total) by mouth daily with breakfast.  30 tablet  11  . metoprolol succinate (TOPROL-XL) 50 MG 24 hr tablet Take 1 tablet (50 mg total) by mouth daily. Take with or immediately following a meal.  90 tablet  0   Facility-Administered Medications Prior to Visit  Medication Dose Route Frequency Provider Last Rate Last Dose  . mitomycin (MUTAMYCIN) chemo injection 40 mg  40 mg Bladder Instillation Once        Allergies  Allergen Reactions  . Cephalexin Other (See Comments)    Bloody loose stools   Patient Active Problem List   Diagnosis Date Noted  . Acute coronary syndrome 11/06/2013  . History of anesthesia reaction 02/27/2012  . CIS (carcinoma in situ of bladder) 08/03/2011  . Hyponatremia 08/02/2011  . Bladder cancer s/p   resection 12/6 07/27/2011  . Hypocalcemia 02/21/2011  . DYSPHAGIA, OROPHARYNGEAL PHASE 07/13/2010  . EDEMA- LOCALIZED 03/22/2010  . PALPITATIONS 03/22/2010  . BENIGN POSITIONAL VERTIGO 10/27/2009  . HYPOTENSION, ORTHOSTATIC 10/27/2009  . TORTICOLLIS 10/27/2009  . HYPERTROPHY PROSTATE W/UR OBST & OTH LUTS 06/03/2009  . FASTING HYPERGLYCEMIA 11/24/2008  . GERD 10/06/2008  . 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 ACTION TREMOR 08/12/2007  . MEMORY LOSS 08/12/2007  . HYPERLIPIDEMIA 10/07/2006  . HYPERTENSION 10/07/2006  . DIVERTICULOSIS, COLON 10/07/2006  . COLONIC POLYPS 11/03/2005    History  Substance Use Topics  . Smoking status: Former Smoker -- 1.00 packs/day for 31 years    Types: Cigarettes    Quit date: 08/21/1973  . Smokeless tobacco: Never Used     Comment: smoked  1944-1975 , up to 1 ppd  . Alcohol Use: 1.2 oz/week    2 Glasses of wine per week     Comment: 11/06/2013 "glass of wine maybe twice/wk"   family history includes Breast cancer in his maternal grandmother; Colon cancer (age of onset: 75) in his mother; Heart attack in his maternal grandfather; Heart attack (age of onset: 79) in his father; Heart failure in his sister; Hypertension in his mother. There is no history of Diabetes.     Objective:   Physical Exam  well-developed elderly white male in no acute distress, pleasant blood pressure 130/70 pulse 80 height 5 foot 9 weight 194, HEENT; nontraumatic normocephalic EOMI PERRLA sclera anicteric, Supple; no JVD, Cardiovascular; regular rate and rhythm with S1-S2 pulmonary clear bilaterally, Abdomen; soft nontender nondistended bowel sounds are active there is no palpable mass or hepatosplenomegaly, Rectal; exam Brown stool Hemoccult negative, Ext; no clubbing cyanosis or edema skin warm and dry, Psych ;mood and affect appropriate        Assessment & Plan:  #1  80-year-old male with complaints of melena x2-3 weeks. This is in the setting of recent initiation of aspirin and Plavix after an MI in March of 2015. Patient is Hemoccult-negative today. Cannot rule out intermittent blood loss and would be concerned about an upper GI source with aspirin use and Plavix. #2 family history of colon cancer #3 history of adenomatous colon polyp 2007 #4 diverticulosis #5 history of bladder cancer #6 GERD #7 status post MI March 2015 with PCI to the RCA #8 chronic kidney disease  Plan; CBC with differential today and be met Increase Prilosec to 40 mg by mouth every morning prescription sent Patient was asked to stop taking an extra whole aspirin and to use  Tylenol as needed for arthritic symptoms We'll go ahead and schedule for upper endoscopy with Dr. Kaplan at [facility]. Procedure was discussed in detail with the patient and he is agreeable to proceed. Procedure will be done with the patient on aspirin and Plavix given recent MI, and if his hemoglobin is normal we may opt to cancel. 

## 2014-01-29 NOTE — Transfer of Care (Signed)
Immediate Anesthesia Transfer of Care Note  Patient: [name]  Procedure(s) Performed: Procedure(s): ESOPHAGOGASTRODUODENOSCOPY (EGD) WITH PROPOFOL (N/A)  Patient Location: PACU  Anesthesia Type:MAC  Level of Consciousness: Patient easily awoken, sedated, comfortable, cooperative, following commands, responds to stimulation.   Airway & Oxygen Therapy: Patient spontaneously breathing, ventilating well, oxygen via simple oxygen mask.  Post-op Assessment: Report given to PACU RN, vital signs reviewed and stable, moving all extremities.   Post vital signs: Reviewed and stable.  Complications: No apparent anesthesia complications

## 2014-01-29 NOTE — Anesthesia Postprocedure Evaluation (Signed)
Anesthesia Post Note  Patient: [name]  Procedure(s) Performed: Procedure(s) (LRB): ESOPHAGOGASTRODUODENOSCOPY (EGD) WITH PROPOFOL (N/A)  Anesthesia type: MAC  Patient location: PACU  Post pain: Pain level controlled  Post assessment: Post-op Vital signs reviewed  Last Vitals:  Filed Vitals:   01/29/14 1203  BP: 133/89  Pulse: 98  Temp:   Resp: 15    Post vital signs: Reviewed  Level of consciousness: sedated  Complications: No apparent anesthesia complications

## 2014-01-29 NOTE — Anesthesia Preprocedure Evaluation (Addendum)
Anesthesia Evaluation  Patient identified by MRN, date of birth, ID band Patient awake    Reviewed: Allergy & Precautions, H&P , NPO status , Patient's Chart, lab work & pertinent test results, reviewed documented beta blocker date and time   History of Anesthesia Complications (+) Emergence Delirium  Airway Mallampati: II TM Distance: >3 FB Neck ROM: full    Dental no notable dental hx. (+) Teeth Intact, Dental Advisory Given   Pulmonary neg pulmonary ROS, former smoker,  breath sounds clear to auscultation  Pulmonary exam normal       Cardiovascular Exercise Tolerance: Poor hypertension, Pt. on home beta blockers and Pt. on medications + CAD, + Past MI, + Cardiac Stents and + Peripheral Vascular Disease Rhythm:regular Rate:Normal  Palpitations. Iliac artery aneurysm stable.   Neuro/Psych Carotid stenosis negative neurological ROS  negative psych ROS   GI/Hepatic negative GI ROS, Neg liver ROS, GERD-  Medicated and Controlled,  Endo/Other  negative endocrine ROS  Renal/GU negative Renal ROS  negative genitourinary   Musculoskeletal   Abdominal   Peds  Hematology negative hematology ROS (+)   Anesthesia Other Findings Generalized weakness  Reproductive/Obstetrics negative OB ROS                          Anesthesia Physical Anesthesia Plan  ASA: III  Anesthesia Plan: MAC   Post-op Pain Management:    Induction: Intravenous  Airway Management Planned: Nasal Cannula  Additional Equipment:   Intra-op Plan:   Post-operative Plan:   Informed Consent: I have reviewed the patients History and Physical, chart, labs and discussed the procedure including the risks, benefits and alternatives for the proposed anesthesia with the patient or authorized representative who has indicated his/her understanding and acceptance.   Dental advisory given  Plan Discussed with: CRNA  Anesthesia Plan  Comments:         Anesthesia Quick Evaluation

## 2014-01-29 NOTE — Interval H&P Note (Signed)
History and Physical Interval Note:  01/29/2014 11:08 AM  [name]  has presented today for surgery, with the diagnosis of Melena 578.1  The various methods of treatment have been discussed with the patient and family. After consideration of risks, benefits and other options for treatment, the patient has consented to  Procedure(s): ESOPHAGOGASTRODUODENOSCOPY (EGD) WITH PROPOFOL (N/A) as a surgical intervention .  The patient's history has been reviewed, patient examined, no change in status, stable for surgery.  I have reviewed the patient's chart and labs.  Questions were answered to the patient's satisfaction.     The recent H&P (dated *01/21/14**) was reviewed, the patient was examined and there is no change in the patients condition since that H&P was completed.   Erskine Emery  01/29/2014, 11:09 AM   Erskine Emery

## 2014-01-30 ENCOUNTER — Encounter (HOSPITAL_COMMUNITY): Payer: Self-pay | Admitting: Gastroenterology

## 2014-02-04 ENCOUNTER — Other Ambulatory Visit: Payer: Self-pay

## 2014-02-04 DIAGNOSIS — D649 Anemia, unspecified: Secondary | ICD-10-CM

## 2014-03-17 ENCOUNTER — Other Ambulatory Visit: Payer: Self-pay

## 2014-03-17 MED ORDER — VALSARTAN 320 MG PO TABS: 160.0000 mg | ORAL_TABLET | Freq: Every morning | ORAL | Status: DC

## 2014-04-13 ENCOUNTER — Other Ambulatory Visit: Payer: Self-pay | Admitting: Internal Medicine

## 2014-04-13 DIAGNOSIS — I714 Abdominal aortic aneurysm, without rupture, unspecified: Secondary | ICD-10-CM | POA: Insufficient documentation

## 2014-05-12 ENCOUNTER — Encounter: Payer: Medicare Other | Admitting: Vascular Surgery

## 2014-06-24 ENCOUNTER — Encounter: Payer: Self-pay | Admitting: Internal Medicine

## 2014-06-24 ENCOUNTER — Other Ambulatory Visit (INDEPENDENT_AMBULATORY_CARE_PROVIDER_SITE_OTHER): Payer: Medicare Other

## 2014-06-24 ENCOUNTER — Ambulatory Visit (INDEPENDENT_AMBULATORY_CARE_PROVIDER_SITE_OTHER): Payer: Medicare Other | Admitting: Internal Medicine

## 2014-06-24 VITALS — BP 130/78 | HR 90 | Temp 98.3°F | Resp 12 | Wt 192.5 lb

## 2014-06-24 DIAGNOSIS — Z955 Presence of coronary angioplasty implant and graft: Secondary | ICD-10-CM

## 2014-06-24 DIAGNOSIS — R195 Other fecal abnormalities: Secondary | ICD-10-CM

## 2014-06-24 LAB — CBC WITH DIFFERENTIAL/PLATELET
BASOS ABS: 0 10*3/uL (ref 0.0–0.1)
Basophils Relative: 0.4 % (ref 0.0–3.0)
EOS PCT: 1.2 % (ref 0.0–5.0)
Eosinophils Absolute: 0.1 10*3/uL (ref 0.0–0.7)
HCT: 50 % (ref 39.0–52.0)
Hemoglobin: 16.5 g/dL (ref 13.0–17.0)
Lymphocytes Relative: 30.6 % (ref 12.0–46.0)
Lymphs Abs: 2.8 10*3/uL (ref 0.7–4.0)
MCHC: 33 g/dL (ref 30.0–36.0)
MCV: 96.3 fl (ref 78.0–100.0)
MONOS PCT: 7.8 % (ref 3.0–12.0)
Monocytes Absolute: 0.7 10*3/uL (ref 0.1–1.0)
NEUTROS PCT: 60 % (ref 43.0–77.0)
Neutro Abs: 5.5 10*3/uL (ref 1.4–7.7)
PLATELETS: 228 10*3/uL (ref 150.0–400.0)
RBC: 5.19 Mil/uL (ref 4.22–5.81)
RDW: 14 % (ref 11.5–15.5)
WBC: 9.1 10*3/uL (ref 4.0–10.5)

## 2014-06-24 NOTE — Progress Notes (Signed)
Pre visit review using our clinic review tool, if applicable. No additional management support is needed unless otherwise documented below in the visit note. 

## 2014-06-24 NOTE — Progress Notes (Signed)
   Subjective:    Patient ID: [name], male    DOB: 1926-04-25, 80 y.o.   MRN: [id number]  HPI He had a stent to the right coronary artery in March of this year by Dr.Harwani. He is on Plavix and aspirin  He is monitoring his blood pressure  @ home but does not have the readings today .  He describes occasional midsternal chest pain which he relates to eating food. In June of this year he had an upper endoscopy which revealed a small stricture.  He denies any other cardiopulmonary symptoms. He has been compliant with his beta blocker and angiotensin receptor blocker.  He is concerned about the possibility of colon cancer as he has noted dark stool. His gastroenterologist does not want to pursue a colonoscopy because of his age and the comorbidities of coronary disease with  Plavix and aspirin therapy. Stool cards were recommended but  he has refused to complete these. He did have a CBC in June which was normal.  Stool can be thin but  not persistently so      Review of Systems Epistaxis, hemoptysis, hematuria,  or rectal bleeding denied. No unexplained weight loss, significant dyspepsia, or abdominal pain.  There is no abnormal bruising , bleeding, or difficulty stopping bleeding with injury.     Objective:   Physical Exam   Pertinent or positive findings include: Arcus senilis is present He has pattern alopecia. There is an osteoma of the hard palate Heart sounds are distant He has intention tremor both upper extremities. Shins are dry and scaly. He moves very deliberately with a broad-based gait. He has bilateral varices in the scrotum. Prostate is 2+ enlarged without nodularity. Scant stool was obtained which is light yellow and Hemoccult negative.   General appearance :adequately nourished; in no distress. Appears younger than stated age Eyes: No conjunctival inflammation or scleral icterus is present. Oral exam: Dental hygiene is good. Lips and gums are healthy  appearing.There is no oropharyngeal erythema or exudate noted.  Heart:  Normal rate and regular rhythm. S1 and S2 normal without gallop, murmur, click, rub or other extra sounds   Lungs:Chest clear to auscultation; no wheezes, rhonchi,rales ,or rubs present.No increased work of breathing.  Abdomen: bowel sounds normal, soft and non-tender without masses, organomegaly or hernias noted.  No guarding or rebound. No flank tenderness to percussion. Vascular : all pulses equal ; no bruits present. Skin:Warm & dry.  Intact without suspicious lesions or rashes ; no jaundice or tenting Lymphatic: No lymphadenopathy is noted about the head, neck, axilla, or inguinal areas.            Assessment & Plan:  #1 dark stool subjectively; negative fecal occult blood on digital rectal exam today  #2 coronary artery disease, status post stent to RCA. On Plavix and aspirin prophylactically.  Plan: CBC will be checked.  The risk-benefit ratio of colonoscopy was discussed. I told him I felt that the risks far outweighed the benefit of pursuing such.  The blood counts and stool cards can be monitored if he has progressive GI symptoms.

## 2014-06-24 NOTE — Patient Instructions (Addendum)
Your next office appointment will be determined based upon review of your pending labs. Those instructions will be transmitted to you through My Chart  OR  by mail;whichever process is your choice to receive results & recommendations .  Please report unexplained weight loss, abdominal pain, significant heart burn or food sticking in throat, rectal bleeding, or persistently small caliber stools .

## 2014-06-30 ENCOUNTER — Other Ambulatory Visit: Payer: Self-pay

## 2014-06-30 MED ORDER — VALSARTAN 320 MG PO TABS: 160.0000 mg | ORAL_TABLET | Freq: Every morning | ORAL | Status: DC

## 2014-07-30 ENCOUNTER — Encounter (HOSPITAL_COMMUNITY): Payer: Self-pay | Admitting: Cardiology

## 2014-12-23 ENCOUNTER — Other Ambulatory Visit: Payer: Self-pay

## 2014-12-23 MED ORDER — VALSARTAN 320 MG PO TABS: 160.0000 mg | ORAL_TABLET | Freq: Every morning | ORAL | Status: DC

## 2015-02-12 ENCOUNTER — Other Ambulatory Visit: Payer: Self-pay | Admitting: Physician Assistant

## 2015-02-16 ENCOUNTER — Other Ambulatory Visit: Payer: Self-pay | Admitting: Cardiology

## 2015-02-16 DIAGNOSIS — R079 Chest pain, unspecified: Secondary | ICD-10-CM

## 2015-02-26 ENCOUNTER — Encounter (HOSPITAL_COMMUNITY)
Admission: RE | Admit: 2015-02-26 | Discharge: 2015-02-26 | Disposition: A | Discharge status: Home / Self Care | Payer: Medicare Other | Source: Ambulatory Visit | Attending: Cardiology | Admitting: Cardiology

## 2015-02-26 ENCOUNTER — Other Ambulatory Visit: Payer: Self-pay | Admitting: Cardiology

## 2015-02-26 DIAGNOSIS — R079 Chest pain, unspecified: Secondary | ICD-10-CM | POA: Insufficient documentation

## 2015-02-26 MED ORDER — REGADENOSON 0.4 MG/5ML IV SOLN: 0.4000 mg | Freq: Once | INTRAVENOUS | Status: DC

## 2015-02-26 MED ORDER — TECHNETIUM TC 99M SESTAMIBI GENERIC - CARDIOLITE
10.0000 | Freq: Once | INTRAVENOUS | Status: AC | PRN
  Administered 2015-02-26: 10 via INTRAVENOUS

## 2015-02-26 MED ORDER — TECHNETIUM TC 99M SESTAMIBI - CARDIOLITE
30.0000 | Freq: Once | INTRAVENOUS | Status: AC | PRN
  Administered 2015-02-26: 30 via INTRAVENOUS

## 2015-02-26 MED ORDER — REGADENOSON 0.4 MG/5ML IV SOLN
INTRAVENOUS | Status: AC
  Administered 2015-02-26: 0.4 mg
  Filled 2015-02-26: qty 5

## 2015-06-07 ENCOUNTER — Encounter: Payer: Self-pay | Admitting: *Deleted

## 2015-06-18 ENCOUNTER — Other Ambulatory Visit: Payer: Self-pay | Admitting: Internal Medicine

## 2015-09-13 ENCOUNTER — Emergency Department (HOSPITAL_COMMUNITY): Payer: Medicare Other

## 2015-09-13 ENCOUNTER — Inpatient Hospital Stay (HOSPITAL_COMMUNITY)
Admission: EM | Admit: 2015-09-13 | Discharge: 2015-09-15 | DRG: 149 | Disposition: A | Discharge status: Home / Self Care | Payer: Medicare Other | Attending: Family Medicine | Admitting: Family Medicine

## 2015-09-13 ENCOUNTER — Encounter (HOSPITAL_COMMUNITY): Payer: Self-pay | Admitting: Emergency Medicine

## 2015-09-13 ENCOUNTER — Inpatient Hospital Stay (HOSPITAL_COMMUNITY): Payer: Medicare Other

## 2015-09-13 DIAGNOSIS — Z8249 Family history of ischemic heart disease and other diseases of the circulatory system: Secondary | ICD-10-CM

## 2015-09-13 DIAGNOSIS — Z79899 Other long term (current) drug therapy: Secondary | ICD-10-CM

## 2015-09-13 DIAGNOSIS — Z87891 Personal history of nicotine dependence: Secondary | ICD-10-CM | POA: Diagnosis not present

## 2015-09-13 DIAGNOSIS — Z7902 Long term (current) use of antithrombotics/antiplatelets: Secondary | ICD-10-CM

## 2015-09-13 DIAGNOSIS — I252 Old myocardial infarction: Secondary | ICD-10-CM | POA: Diagnosis not present

## 2015-09-13 DIAGNOSIS — K219 Gastro-esophageal reflux disease without esophagitis: Secondary | ICD-10-CM | POA: Diagnosis present

## 2015-09-13 DIAGNOSIS — I639 Cerebral infarction, unspecified: Secondary | ICD-10-CM

## 2015-09-13 DIAGNOSIS — E785 Hyperlipidemia, unspecified: Secondary | ICD-10-CM | POA: Diagnosis present

## 2015-09-13 DIAGNOSIS — I10 Essential (primary) hypertension: Secondary | ICD-10-CM | POA: Diagnosis present

## 2015-09-13 DIAGNOSIS — Z7982 Long term (current) use of aspirin: Secondary | ICD-10-CM

## 2015-09-13 DIAGNOSIS — Z8551 Personal history of malignant neoplasm of bladder: Secondary | ICD-10-CM

## 2015-09-13 DIAGNOSIS — I6789 Other cerebrovascular disease: Secondary | ICD-10-CM | POA: Diagnosis not present

## 2015-09-13 DIAGNOSIS — I2510 Atherosclerotic heart disease of native coronary artery without angina pectoris: Secondary | ICD-10-CM | POA: Diagnosis present

## 2015-09-13 DIAGNOSIS — N390 Urinary tract infection, site not specified: Secondary | ICD-10-CM

## 2015-09-13 DIAGNOSIS — R42 Dizziness and giddiness: Principal | ICD-10-CM | POA: Diagnosis present

## 2015-09-13 LAB — URINALYSIS, ROUTINE W REFLEX MICROSCOPIC
GLUCOSE, UA: NEGATIVE mg/dL
Hgb urine dipstick: NEGATIVE
KETONES UR: NEGATIVE mg/dL
NITRITE: NEGATIVE
PH: 6 (ref 5.0–8.0)
Protein, ur: NEGATIVE mg/dL
Specific Gravity, Urine: 1.028 (ref 1.005–1.030)

## 2015-09-13 LAB — URINE MICROSCOPIC-ADD ON

## 2015-09-13 LAB — CBC
HEMATOCRIT: 48.3 % (ref 39.0–52.0)
Hemoglobin: 16.2 g/dL (ref 13.0–17.0)
MCH: 32.1 pg (ref 26.0–34.0)
MCHC: 33.5 g/dL (ref 30.0–36.0)
MCV: 95.6 fL (ref 78.0–100.0)
Platelets: 192 10*3/uL (ref 150–400)
RBC: 5.05 MIL/uL (ref 4.22–5.81)
RDW: 13.6 % (ref 11.5–15.5)
WBC: 8.1 10*3/uL (ref 4.0–10.5)

## 2015-09-13 LAB — I-STAT CG4 LACTIC ACID, ED
LACTIC ACID, VENOUS: 2.13 mmol/L — AB (ref 0.5–2.0)
Lactic Acid, Venous: 1.03 mmol/L (ref 0.5–2.0)

## 2015-09-13 LAB — BASIC METABOLIC PANEL
Anion gap: 11 (ref 5–15)
BUN: 11 mg/dL (ref 6–20)
CO2: 21 mmol/L — ABNORMAL LOW (ref 22–32)
Calcium: 9.3 mg/dL (ref 8.9–10.3)
Chloride: 103 mmol/L (ref 101–111)
Creatinine, Ser: 1.09 mg/dL (ref 0.61–1.24)
GFR calc Af Amer: >60 mL/min (ref >60)
GFR, EST NON AFRICAN AMERICAN: 59 mL/min — AB (ref >60)
GLUCOSE: 117 mg/dL — AB (ref 65–99)
POTASSIUM: 4.1 mmol/L (ref 3.5–5.1)
Sodium: 135 mmol/L (ref 135–145)

## 2015-09-13 LAB — CBG MONITORING, ED: Glucose-Capillary: 110 mg/dL — ABNORMAL HIGH (ref 65–99)

## 2015-09-13 LAB — TROPONIN I

## 2015-09-13 MED ORDER — ASPIRIN EC 81 MG PO TBEC
81.0000 mg | DELAYED_RELEASE_TABLET | Freq: Every morning | ORAL | Status: DC
  Administered 2015-09-14 – 2015-09-15 (×2): 81 mg via ORAL
  Filled 2015-09-13 (×2): qty 1

## 2015-09-13 MED ORDER — SODIUM CHLORIDE 0.9 % IV SOLN
INTRAVENOUS | Status: DC
  Administered 2015-09-13: 19:00:00 via INTRAVENOUS

## 2015-09-13 MED ORDER — METOPROLOL SUCCINATE ER 25 MG PO TB24
25.0000 mg | ORAL_TABLET | Freq: Two times a day (BID) | ORAL | Status: DC
  Administered 2015-09-13 – 2015-09-15 (×4): 25 mg via ORAL
  Filled 2015-09-13 (×5): qty 1

## 2015-09-13 MED ORDER — STROKE: EARLY STAGES OF RECOVERY BOOK
Freq: Once | Status: DC
Start: 2015-09-13 — End: 2015-09-15
  Filled 2015-09-13: qty 1

## 2015-09-13 MED ORDER — ATORVASTATIN CALCIUM 40 MG PO TABS
40.0000 mg | ORAL_TABLET | Freq: Every day | ORAL | Status: DC
  Administered 2015-09-14 – 2015-09-15 (×2): 40 mg via ORAL
  Filled 2015-09-13 (×2): qty 1

## 2015-09-13 MED ORDER — CLOPIDOGREL BISULFATE 75 MG PO TABS
75.0000 mg | ORAL_TABLET | Freq: Every evening | ORAL | Status: DC
  Administered 2015-09-14: 75 mg via ORAL
  Filled 2015-09-13 (×2): qty 1

## 2015-09-13 MED ORDER — PANTOPRAZOLE SODIUM 40 MG PO TBEC
80.0000 mg | DELAYED_RELEASE_TABLET | Freq: Every day | ORAL | Status: DC
  Administered 2015-09-14 – 2015-09-15 (×2): 80 mg via ORAL
  Filled 2015-09-13 (×2): qty 2

## 2015-09-13 MED ORDER — IRBESARTAN 300 MG PO TABS
300.0000 mg | ORAL_TABLET | Freq: Every day | ORAL | Status: DC
Start: 2015-09-13 — End: 2015-09-15
  Administered 2015-09-13 – 2015-09-15 (×3): 300 mg via ORAL
  Filled 2015-09-13 (×3): qty 1

## 2015-09-13 MED ORDER — TRIAMCINOLONE ACETONIDE 55 MCG/ACT NA AERO
2.0000 | INHALATION_SPRAY | Freq: Every day | NASAL | Status: DC
  Administered 2015-09-13 – 2015-09-14 (×2): 2 via NASAL
  Filled 2015-09-13: qty 21.6

## 2015-09-13 NOTE — ED Notes (Signed)
Awake. Verbally responsive. A/O x4. Resp even and unlabored. No audible adventitious breath sounds noted. ABC's intact. SR on monitor. IV saline lock patent and intact. Family at bedside. 

## 2015-09-13 NOTE — ED Notes (Signed)
Pt reported having dizziness with resolving noted denies headache, LOC, hitting head and visual disturbances. Pt report multiple falls in past 6 months with most recent near-fall episode today. Pt is taking blood thinner-Plavix

## 2015-09-13 NOTE — ED Notes (Signed)
Patient transported to MRI 

## 2015-09-13 NOTE — ED Provider Notes (Signed)
CSN: [id number]     Arrival date & time 09/13/15  1149 History   First MD Initiated Contact with Patient 09/13/15 1253     Chief Complaint  Patient presents with  . Loss of Consciousness      Patient is a 82 y.o. male presenting with syncope. The history is provided by the patient.  Loss of Consciousness Associated symptoms: dizziness   Associated symptoms: no chest pain and no shortness of breath    patient presents with dizziness. States he feels like stuff is spinning around but also is felt like his final pass out. States he has had vertigo before but states this does not feel like that. States he tried an Printmaker which slightly improved symptoms. No localizing numbness or weakness. No headache. States he has had episodes over the last 3 days. No fevers. No cough. No chest pain or trouble breathing.  History reviewed. No pertinent past medical history. Past Surgical History  Procedure Laterality Date  . Tonsillectomy    . Colonoscopy  2007    Diverticulosis  . Transurethral resection of bladder tumor  07/27/2011    Procedure: TRANSURETHRAL RESECTION OF BLADDER TUMOR (TURBT);  Surgeon: Malka So;  Location: [facility];  Service: Urology;  Laterality: N/A;  Cystoscopy/Transurethral Resection of Bladder Tumor  . Ercp  02/01/2012    Procedure: ENDOSCOPIC RETROGRADE CHOLANGIOPANCREATOGRAPHY (ERCP);  Surgeon: Beryle Beams, MD;  Location: [facility];  Service: Endoscopy;  Laterality: N/A;  . Cataract extraction w/ intraocular lens  implant, bilateral Bilateral   . Laparoscopic cholecystectomy  06-27-2007    Dr Johnathan Hausen  . Cystoscopy w/ retrogrades Bilateral 12/26/2012    Procedure: CYSTOSCOPY WITH Bilateral RETROGRADE PYELOGRAM;  Surgeon: Malka So, MD;  Location: [facility];  Service: Urology;  Laterality: Bilateral;  BLADDER BIOPSY  . Fulguration of bladder tumor N/A 12/26/2012    Procedure: Multiple Bladder Biopsy;  Surgeon: Malka So, MD;  Location:  [facility];  Service: Urology;  Laterality: N/A;  . Coronary angioplasty with stent placement  11/06/2013    "1"  . Esophagogastroduodenoscopy (egd) with propofol N/A 01/29/2014    Procedure: ESOPHAGOGASTRODUODENOSCOPY (EGD) WITH PROPOFOL;  Surgeon: Inda Castle, MD;  Location: [facility];  Service: Endoscopy;  Laterality: N/A;  . Left heart catheterization with coronary angiogram N/A 11/06/2013    Procedure: LEFT HEART CATHETERIZATION WITH CORONARY ANGIOGRAM;  Surgeon: Clent Demark, MD;  Location[facility];  Service: Cardiovascular;  Laterality: N/A;   Family History  Problem Relation Age of Onset  . Heart attack Father 54  . Colon cancer Mother 40  . Hypertension Mother   . Breast cancer Maternal Grandmother   . Heart attack Maternal Grandfather     late 63s  . Heart failure Sister   . Diabetes Neg Hx    Social History  Substance Use Topics  . Smoking status: Former Smoker -- 1.00 packs/day for 31 years    Types: Cigarettes    Quit date: 08/21/1973  . Smokeless tobacco: Never Used     Comment: smoked  1944-1975 , up to 1 ppd  . Alcohol Use: 1.2 oz/week    2 Glasses of wine per week     Comment: 11/06/2013 "glass of wine maybe twice/wk"    Review of Systems  Constitutional: Positive for appetite change.  HENT: Negative for ear pain.   Respiratory: Negative for shortness of breath.   Cardiovascular: Positive for syncope. Negative for chest pain.  Gastrointestinal: Negative for abdominal pain.  Musculoskeletal: Positive for gait problem.  Skin: Negative for wound.  Neurological: Positive for dizziness and light-headedness. Negative for tremors.      Allergies  Cephalexin  Home Medications   Prior to Admission medications   Medication Sig Start Date End Date Taking? Authorizing Provider  aspirin 325 MG tablet Take 325 mg by mouth once.   Yes Historical Provider, MD  aspirin EC 81 MG tablet Take 81 mg by mouth every morning.   Yes Historical  Provider, MD  atorvastatin (LIPITOR) 40 MG tablet Take 40 mg by mouth daily. 08/20/15  Yes Historical Provider, MD  CALCIUM PO Take by mouth every evening.   Yes Historical Provider, MD  clopidogrel (PLAVIX) 75 MG tablet Take 75 mg by mouth every evening.   Yes Historical Provider, MD  metoprolol succinate (TOPROL-XL) 50 MG 24 hr tablet Take 25 mg by mouth 2 (two) times daily. Take with or immediately following a meal. 12/19/13  Yes Hendricks Limes, MD  nitroGLYCERIN (NITROSTAT) 0.4 MG SL tablet Place 1 tablet (0.4 mg total) under the tongue every 5 (five) minutes x 3 doses as needed for chest pain. 11/08/13  Yes Charolette Forward, MD  omeprazole (PRILOSEC) 40 MG capsule TAKE ONE CAPSULE BY MOUTH EVERY DAY Patient taking differently: Take 1 capsule (40 mg) by mouth every day. 02/12/15  Yes Amy S Esterwood, PA-C  triamcinolone (NASACORT ALLERGY 24HR) 55 MCG/ACT AERO nasal inhaler Place 2 sprays into the nose daily.   Yes Historical Provider, MD  valsartan (DIOVAN) 320 MG tablet Take 0.5 tablets (160 mg total) by mouth daily. --- Needs office visit for further refills. Patient taking differently: Take 160 mg by mouth every evening. --- Needs office visit for further refills. 06/18/15  Yes Hendricks Limes, MD   BP 132/80 mmHg  Pulse 66  Temp(Src) 98.2 F (36.8 C) (Oral)  Resp 14  SpO2 96% Physical Exam  Constitutional: He is oriented to person, place, and time.  HENT:  Head: Atraumatic.  Eyes: EOM are normal. Pupils are equal, round, and reactive to light.  Neck: Neck supple.  Cardiovascular: Normal rate.   Pulmonary/Chest: Effort normal.  Abdominal: Soft.  Neurological: He is alert and oriented to person, place, and time. No cranial nerve deficit. Coordination normal.  Patient feels worse with sitting up. States that it hurts. Finger-nose intact bilaterally. Extraocular movements intact. Pupils reactive. Face symmetric. Good grips bilaterally. Good straight leg raise bilaterally.  Skin: Skin is  warm.    ED Course  Procedures (including critical care time) Labs Review Labs Reviewed  BASIC METABOLIC PANEL - Abnormal; Notable for the following:    CO2 21 (*)    Glucose, Bld 117 (*)    GFR calc non Af Amer 59 (*)    All other components within normal limits  URINALYSIS, ROUTINE W REFLEX MICROSCOPIC (NOT AT [facility]) - Abnormal; Notable for the following:    Color, Urine AMBER (*)    Bilirubin Urine SMALL (*)    Leukocytes, UA SMALL (*)    All other components within normal limits  URINE MICROSCOPIC-ADD ON - Abnormal; Notable for the following:    Squamous Epithelial / LPF 0-5 (*)    Bacteria, UA FEW (*)    Casts HYALINE CASTS (*)    All other components within normal limits  CBG MONITORING, ED - Abnormal; Notable for the following:    Glucose-Capillary 110 (*)    All other components within normal limits  I-STAT  CG4 LACTIC ACID, ED - Abnormal; Notable for the following:    Lactic Acid, Venous 2.13 (*)    All other components within normal limits  URINE CULTURE  CBC  TROPONIN I  I-STAT CG4 LACTIC ACID, ED    Imaging Review Dg Chest 2 View  09/13/2015  CLINICAL DATA:  Dizziness for 3 days. EXAM: CHEST  2 VIEW COMPARISON:  11/06/2013 FINDINGS: Mild elevation of the right hemidiaphragm with right base atelectasis. No confluent opacity on the left. Heart is borderline in size. No effusions or acute bony abnormality. IMPRESSION: Minimal right base atelectasis.  Borderline heart size. Electronically Signed   By: Rolm Baptise M.D.   On: 09/13/2015 14:21   Ct Head Wo Contrast  09/13/2015  CLINICAL DATA:  Dizziness.  Nausea. EXAM: CT HEAD WITHOUT CONTRAST TECHNIQUE: Contiguous axial images were obtained from the base of the skull through the vertex without intravenous contrast. COMPARISON:  None. FINDINGS: There is moderate diffuse atrophy. There is no intracranial mass, hemorrhage, extra-axial fluid collection, or midline shift. There is patchy small vessel disease in the centra  semiovale bilaterally. There is evidence of a prior infarct in the high anterior right parietal lobe. No acute infarct evident. The bony calvarium appears intact. The mastoid air cells are clear. There is apparent cerumen in each external auditory canal. Orbits appear symmetric and unremarkable bilaterally. There is a retention cyst in the inferior left maxillary antrum. There is mild rightward deviation of the nasal septum. IMPRESSION: Atrophy with periventricular small vessel disease. Prior infarct in the high anterior right frontal lobe. No acute infarct evident. No hemorrhage or mass effect. Retention cyst inferior left maxillary antrum. Cerumen in each external auditory canal. Electronically Signed   By: Lowella Grip III M.D.   On: 09/13/2015 14:37   I have personally reviewed and evaluated these images and lab results as part of my medical decision-making.   EKG Interpretation   Date/Time:  Monday September 13 2015 11:57:12 EST Ventricular Rate:  71 PR Interval:  174 QRS Duration: 100 QT Interval:  401 QTC Calculation: 436 R Axis:   92 Text Interpretation:  Sinus rhythm Right axis deviation Borderline T  abnormalities, anterior leads No significant change since last tracing  Confirmed by Alvino Chapel  MD, Ovid Curd [id number]) on 09/13/2015 1:13:35 PM      MDM   Final diagnoses:  Dizziness  UTI (lower urinary tract infection)    Patient with vertigo/dizziness/lightheadedness. Good finger-nose but had difficulty sitting up. Will admit to internal medicine since had possible syncope with it. MRI requested and will be ordered in the ER.    Davonna Belling, MD 09/13/15 [phone]

## 2015-09-13 NOTE — ED Notes (Signed)
Dr Alvino Chapel and RN Lanelle Bal notified of lactic 2.13

## 2015-09-13 NOTE — ED Notes (Signed)
Awake. Verbally responsive. A/O x4. Resp even and unlabored. No audible adventitious breath sounds noted. ABC's intact. IV saline lock patent and intact. Family at bedside. 

## 2015-09-13 NOTE — H&P (Signed)
[facility] History and Physical  [name] [id number] DOB: Jun 07, 1926 DOA: 09/13/2015  Referring physician: ER physician:Dr. Davonna Belling PCP: Unice Cobble, MD  Chief Complaint: Dizziness  HPI:  82 year old male with past medical history of hypertension, GERD, coronary artery disease on aspirin and Plavix, dyslipidemia who presented to [facility] long hospital with reports of dizziness and soon as he stands up and feeling as if he will fall down. He says he doesn't feel those symptoms while he is laying down in the bed. Patient reports no associated chest pain or shortness of breath. No lightheadedness. No falls. His episodes started a few days ago and he thought it would get better so he didn't come to the hospital.  In ED, patient was hemodynamically stable. Blood work was relatively unremarkable other than lactic acid of 2.13. Chest x-ray showed minimal right base atelectasis otherwise no acute findings. CT head showed atrophy with periventricular small vessel disease and prior infarct in the high anterior right frontal lobe but no acute infarct evident. MRI brain is ordered but not yet completed.  Assessment & Plan    Principal Problem: Dizziness / possible CVA Stroke work up initiated:  - Aspirin daily - CT head showed no acute infarct but there is evidence of prior infarct - MRI brain / MRA brain-pending - 2D ECHO - pending  - Carotid doppler - pending  - HgbA1c, Lipid panel - pending. LDL goal < 100. Patient on atorvastatin 40 mg daily - Diet: NPO until swallow evaluation completed  - Therapy: PT/OT Other Stroke Risk Factors : Advanced age, history of CVA, hypertension  Active problems: Dyslipidemia - Continue statin therapy once passes swallow screen  Coronary artery disease - No reports of chest pain - Resume aspirin and Plavix once passes swallow screen  Essential hypertension - Resume metoprolol as noted above after patient passes swallow  screen   DVT prophylaxis:  - SCDs bilaterally  Radiological Exams on Admission: Dg Chest 2 View 09/13/2015  Minimal right base atelectasis.  Borderline heart size. Electronically Signed   By: Rolm Baptise M.D.   On: 09/13/2015 14:21   Ct Head Wo Contrast 09/13/2015  Atrophy with periventricular small vessel disease. Prior infarct in the high anterior right frontal lobe. No acute infarct evident. No hemorrhage or mass effect. Retention cyst inferior left maxillary antrum. Cerumen in each external auditory canal. Electronically Signed   By: Lowella Grip III M.D.   On: 09/13/2015 14:37    Code Status: Full Family Communication: Plan of care discussed with the patient and his wife at the bedside Disposition Plan: Admit for further evaluation, telemetry  DEVINE, Dedra Skeens, MD  [facility] Pager [phone]  Time spent in minutes: 75 minutes  Review of Systems:  Constitutional: Negative for fever, chills and malaise/fatigue. Negative for diaphoresis.  HENT: Negative for hearing loss, ear pain, nosebleeds, congestion, sore throat, neck pain, tinnitus and ear discharge.   Eyes: Negative for blurred vision, double vision, photophobia, pain, discharge and redness.  Respiratory: Negative for cough, hemoptysis, sputum production, shortness of breath, wheezing and stridor.   Cardiovascular: Negative for chest pain, palpitations, orthopnea, claudication and leg swelling.  Gastrointestinal: Negative for nausea, vomiting and abdominal pain. Negative for heartburn, constipation, blood in stool and melena.  Genitourinary: Negative for dysuria, urgency, frequency, hematuria and flank pain.  Musculoskeletal: Negative for myalgias, back pain, joint pain and falls.  Skin: Negative for itching and rash.  Neurological: per HPI Endo/Heme/Allergies: Negative for environmental allergies and polydipsia. Does not bruise/bleed  easily.  Psychiatric/Behavioral: Negative for suicidal ideas. The patient is not  nervous/anxious.      Past Medical History  Diagnosis Date  . DIVERTICULOSIS, COLON 10/07/2006    pt. has freq boutw with constipation alternates with diarrhea  . HYPERLIPIDEMIA 10/07/2006  . TORTICOLLIS 10/27/2009  . Meralgia paraesthetica   . HYPERTENSION 10/07/2006  . History of bladder cancer UROTHELIAL CANCER OF BLADDER--  S/P TURBT 07-27-2011; TCC W/ HIGH GRADE SUPERFICIAL  RECEIVED BCG TX'S    MONTIORED BY DR [facility]  . History of sepsis 2007-- UROSEPSIS  . History of prostatitis   . GERD (gastroesophageal reflux disease)   . Benign essential tremor HANDS  . Iliac artery aneurysm, right (HCC) COMMON--  LAST VISIT W/ DR LAWSON  3.03 X 3.24CM-- STABLE PER CT SCAN 03-04-2012  DONE BY PCP DR HOPPER    PT ASYMPTOMATIC  . Generalized weakness   . Poor memory   . Colon polyp     ? 2007  . Myocardial infarction ([place]) 11/05/2013    "mild one"  . Coronary artery disease    Past Surgical History  Procedure Laterality Date  . Tonsillectomy    . Colonoscopy  2007    Diverticulosis  . Transurethral resection of bladder tumor  07/27/2011    Procedure: TRANSURETHRAL RESECTION OF BLADDER TUMOR (TURBT);  Surgeon: Malka So;  Location: [facility];  Service: Urology;  Laterality: N/A;  Cystoscopy/Transurethral Resection of Bladder Tumor  . Ercp  02/01/2012    Procedure: ENDOSCOPIC RETROGRADE CHOLANGIOPANCREATOGRAPHY (ERCP);  Surgeon: Beryle Beams, MD;  Location: [facility];  Service: Endoscopy;  Laterality: N/A;  . Cataract extraction w/ intraocular lens  implant, bilateral Bilateral   . Laparoscopic cholecystectomy  06-27-2007    Dr Johnathan Hausen  . Cystoscopy w/ retrogrades Bilateral 12/26/2012    Procedure: CYSTOSCOPY WITH Bilateral RETROGRADE PYELOGRAM;  Surgeon: Malka So, MD;  Location: [facility];  Service: Urology;  Laterality: Bilateral;  BLADDER BIOPSY  . Fulguration of bladder tumor N/A 12/26/2012    Procedure: Multiple Bladder Biopsy;  Surgeon: Malka So, MD;   Location: [facility];  Service: Urology;  Laterality: N/A;  . Coronary angioplasty with stent placement  11/06/2013    "1"  . Esophagogastroduodenoscopy (egd) with propofol N/A 01/29/2014    Procedure: ESOPHAGOGASTRODUODENOSCOPY (EGD) WITH PROPOFOL;  Surgeon: Inda Castle, MD;  Location: [facility];  Service: Endoscopy;  Laterality: N/A;  . Left heart catheterization with coronary angiogram N/A 11/06/2013    Procedure: LEFT HEART CATHETERIZATION WITH CORONARY ANGIOGRAM;  Surgeon: Clent Demark, MD;  Location[facility];  Service: Cardiovascular;  Laterality: N/A;   Social History:  reports that he quit smoking about 42 years ago. His smoking use included Cigarettes. He has a 31 pack-year smoking history. He has never used smokeless tobacco. He reports that he drinks about 1.2 oz of alcohol per week. He reports that he does not use illicit drugs.  Allergies  Allergen Reactions  . Cephalexin Other (See Comments)    Bloody loose stools    Family History:  Family History  Problem Relation Age of Onset  . Heart attack Father 52  . Colon cancer Mother 74  . Hypertension Mother   . Breast cancer Maternal Grandmother   . Heart attack Maternal Grandfather     late 84s  . Heart failure Sister   . Diabetes Neg Hx      Prior to Admission medications   Medication Sig Start Date  End Date Taking? Authorizing Provider  aspirin 325 MG tablet Take 325 mg by mouth once.   Yes Historical Provider, MD  aspirin EC 81 MG tablet Take 81 mg by mouth every morning.   Yes Historical Provider, MD  atorvastatin (LIPITOR) 40 MG tablet Take 40 mg by mouth daily. 08/20/15  Yes Historical Provider, MD  CALCIUM PO Take by mouth every evening.   Yes Historical Provider, MD  clopidogrel (PLAVIX) 75 MG tablet Take 75 mg by mouth every evening.   Yes Historical Provider, MD  metoprolol succinate (TOPROL-XL) 50 MG 24 hr tablet Take 25 mg by mouth 2 (two) times daily. Take with or immediately  following a meal. 12/19/13  Yes Hendricks Limes, MD  nitroGLYCERIN (NITROSTAT) 0.4 MG SL tablet Place 1 tablet (0.4 mg total) under the tongue every 5 (five) minutes x 3 doses as needed for chest pain. 11/08/13  Yes Charolette Forward, MD  omeprazole (PRILOSEC) 40 MG capsule TAKE ONE CAPSULE BY MOUTH EVERY DAY Patient taking differently: Take 1 capsule (40 mg) by mouth every day. 02/12/15  Yes Amy S Esterwood, PA-C  triamcinolone (NASACORT ALLERGY 24HR) 55 MCG/ACT AERO nasal inhaler Place 2 sprays into the nose daily.   Yes Historical Provider, MD  valsartan (DIOVAN) 320 MG tablet Take 0.5 tablets (160 mg total) by mouth daily. --- Needs office visit for further refills. Patient taking differently: Take 160 mg by mouth every evening. --- Needs office visit for further refills. 06/18/15  Yes Hendricks Limes, MD   Physical Exam: Filed Vitals:   09/13/15 1158 09/13/15 1236 09/13/15 1331  BP: 141/84 146/76 132/80  Pulse: 74  66  Temp: 98.1 F (36.7 C)  98.2 F (36.8 C)  TempSrc: Oral  Oral  Resp: 16 27 14   SpO2:   96%    Physical Exam  Constitutional: Appears well-developed and well-nourished. No distress.  HENT: Normocephalic. No tonsillar erythema or exudates Eyes: Conjunctivae  are normal. No scleral icterus.  Neck: Normal ROM. Neck supple. No JVD. No tracheal deviation. No thyromegaly.  CVS: RRR, S1/S2 +, no murmurs, no gallops, no carotid bruit.  Pulmonary: Effort and breath sounds normal, no stridor, rhonchi, wheezes, rales.  Abdominal: Soft. BS +,  no distension, tenderness, rebound or guarding.  Musculoskeletal: Normal range of motion. No edema and no tenderness.  Lymphadenopathy: No lymphadenopathy noted, cervical, inguinal. Skin: Skin is warm and dry. No rash noted.  No erythema. No pallor.  Psychiatric: Normal mood and affect. Behavior, judgment, thought content normal.    General: Mental Status: Alert, oriented, thought content appropriate. No evidence of aphasia. Able to follow  3 step commands without difficulty. Cranial Nerves: II: Discs flat bilaterally; Visual fields grossly normal, pupils equal, round, reactive to light and accommodation III,IV, VI: ptosis not present, extra-ocular motions intact bilaterally V,VII: smile symmetric, facial light touch sensation normal bilaterally VIII: hearing normal bilaterally IX,X: uvula rises symmetrically XI: bilateral shoulder shrug XII: midline tongue extension without atrophy or fasciculations  Motor: Right :Upper extremity 5/5Left: Upper extremity 5/5 Lower extremity 5/5Lower extremity 5/5 Tone and bulk:normal tone throughout; no atrophy noted Sensory: Pinprick and light touch intact throughout, bilaterally Deep Tendon Reflexes:  Right: Upper Extremity Left: Upper extremity   biceps (C-5 to C-6) 2/4 biceps (C-5 to C-6) 2/4 tricep (C7) 2/4triceps (C7) 2/4 Brachioradialis (C6) 2/4Brachioradialis (C6) 2/4  Lower Extremity Lower Extremity  quadriceps (L-2 to L-4) 2/4 quadriceps (L-2 to L-4) 2/4 Achilles (S1) 2/4Achilles (S1) 2/4  Plantars: Right: downgoingLeft: downgoing Cerebellar:  normal finger-to-nose, normal heel-to-shin test Gait:  Unable to test due to pt feeling dizzy when he stand up   Labs on Admission:  Basic Metabolic Panel:  Recent Labs Lab 09/13/15 1217  NA 135  K 4.1  CL 103  CO2 21*  GLUCOSE 117*  BUN 11  CREATININE 1.09  CALCIUM 9.3   Liver Function Tests: No results for input(s): AST, ALT, ALKPHOS, BILITOT, PROT, ALBUMIN in the last 168 hours. No results for input(s): LIPASE, AMYLASE in the last 168 hours. No results for input(s): AMMONIA in the last 168 hours. CBC:  Recent  Labs Lab 09/13/15 1217  WBC 8.1  HGB 16.2  HCT 48.3  MCV 95.6  PLT 192   Cardiac Enzymes:  Recent Labs Lab 09/13/15 1224  TROPONINI <0.03   BNP: Invalid input(s): POCBNP CBG:  Recent Labs Lab 09/13/15 1329  GLUCAP 110*    If 7PM-7AM, please contact night-coverage [email or web address] Password [email or web address] 09/13/2015, 4:00 PM

## 2015-09-13 NOTE — ED Notes (Signed)
Pt c/o dizziness with syncope and falls, along with nausea x 3 days. Denies any other symptoms.

## 2015-09-13 NOTE — Progress Notes (Signed)
Patient noted to nurse that there was blood in his urinal. Upon inspection, a couple of pink drops of urine are noted on the side of the urinal. No frank blood noted in urine, still yellow in color. Will continue to monitor.

## 2015-09-14 ENCOUNTER — Inpatient Hospital Stay (HOSPITAL_COMMUNITY): Payer: Medicare Other

## 2015-09-14 ENCOUNTER — Encounter (HOSPITAL_COMMUNITY): Payer: Self-pay | Admitting: *Deleted

## 2015-09-14 DIAGNOSIS — I639 Cerebral infarction, unspecified: Secondary | ICD-10-CM

## 2015-09-14 DIAGNOSIS — I6789 Other cerebrovascular disease: Secondary | ICD-10-CM

## 2015-09-14 DIAGNOSIS — R42 Dizziness and giddiness: Principal | ICD-10-CM

## 2015-09-14 LAB — LIPID PANEL
CHOL/HDL RATIO: 3.9 ratio
CHOLESTEROL: 105 mg/dL (ref 0–200)
HDL: 27 mg/dL — ABNORMAL LOW (ref >40)
LDL Cholesterol: 57 mg/dL (ref 0–99)
Triglycerides: 106 mg/dL (ref <150)
VLDL: 21 mg/dL (ref 0–40)

## 2015-09-14 LAB — URINE CULTURE: Culture: 4000

## 2015-09-14 MED ORDER — ACETAMINOPHEN 325 MG PO TABS
650.0000 mg | ORAL_TABLET | Freq: Four times a day (QID) | ORAL | Status: DC | PRN
  Administered 2015-09-14: 650 mg via ORAL
  Filled 2015-09-14 (×3): qty 2

## 2015-09-14 NOTE — Progress Notes (Signed)
  Echocardiogram 2D Echocardiogram has been performed.  [name] M 09/14/2015, 12:08 PM

## 2015-09-14 NOTE — Progress Notes (Signed)
VASCULAR LAB PRELIMINARY  PRELIMINARY  PRELIMINARY  PRELIMINARY  Carotid duplex  completed.    Preliminary report:  Bilateral:  1-39% ICA stenosis.  Vertebral artery flow is antegrade.      Atiyah Bauer, RVT 09/14/2015, 9:10 AM

## 2015-09-14 NOTE — Care Management Note (Signed)
Case Management Note  Patient Details  Name: [name] MRN: [id number] Date of Birth: 10-04-1925  Subjective/Objective:      cva              Action/Plan:Date: September 14, 2015 Chart reviewed for concurrent status and case management needs. Will continue to follow patient for changes and needs: [name], BSN, CCM, RN,   [phone]   Expected Discharge Date:   (unknown)               Expected Discharge Plan:  Home/Self Care  In-House Referral:  NA  Discharge planning Services  CM Consult  Post Acute Care Choice:  NA Choice offered to:  NA  DME Arranged:    DME Agency:     HH Arranged:    HH Agency:     Status of Service:  In process, will continue to follow  Medicare Important Message Given:    Date Medicare IM Given:    Medicare IM give by:    Date Additional Medicare IM Given:    Additional Medicare Important Message give by:     If discussed at [place] of Stay Meetings, dates discussed:    Additional Comments:  Benjamin Cha, RN 09/14/2015, 11:32 AM

## 2015-09-14 NOTE — Progress Notes (Signed)
[facility] PROGRESS NOTE  [name] [id number] DOB: 02-20-1926 DOA: 09/13/2015 PCP: Charolette Forward, MD  Brief narrative: 82 year old who presented with dizziness undergoing stroke workup  Assessment/Plan: Principal Problem:   Dizziness - CT of head and MR of brain reporting no acute stroke - Awaiting results on pending imaging studies  Active Problems:   Essential hypertension - Currently well controlled on current regimen. We'll continue    Coronary artery disease involving native coronary artery without angina pectoris - Stable continue aspirin and statin. No chest pain reported   Code Status: Full Family Communication: Discussed with patient and spouse at bedside Disposition Plan: Pending results of pending imaging studies   Consultants:  none  Procedures:  MRI brain  CT head  Carotid U/S  echocardiogram  Antibiotics:  None  HPI/Subjective: Pt has no new complaints no acute issues overnight  Objective: Filed Vitals:   09/14/15 0245 09/14/15 0533  BP: 152/77 139/79  Pulse: 67 66  Temp: 98.4 F (36.9 C) 98.4 F (36.9 C)  Resp: 18 20    Intake/Output Summary (Last 24 hours) at 09/14/15 1439 Last data filed at 09/14/15 0831  Gross per 24 hour  Intake      0 ml  Output    150 ml  Net   -150 ml   There were no vitals filed for this visit.  Exam:   General:  Pt in nad, alert and awake  Cardiovascular: rrr, no rubs  Respiratory: CTA BL, no wheezes  Abdomen: soft, nt, nd  Musculoskeletal: no cyanosis or clubbing   Data Reviewed: Basic Metabolic Panel:  Recent Labs Lab 09/13/15 1217  NA 135  K 4.1  CL 103  CO2 21*  GLUCOSE 117*  BUN 11  CREATININE 1.09  CALCIUM 9.3   Liver Function Tests: No results for input(s): AST, ALT, ALKPHOS, BILITOT, PROT, ALBUMIN in the last 168 hours. No results for input(s): LIPASE, AMYLASE in the last 168 hours. No results for input(s): AMMONIA in the last 168 hours. CBC:  Recent  Labs Lab 09/13/15 1217  WBC 8.1  HGB 16.2  HCT 48.3  MCV 95.6  PLT 192   Cardiac Enzymes:  Recent Labs Lab 09/13/15 1224  TROPONINI <0.03   BNP (last 3 results) No results for input(s): BNP in the last 8760 hours.  ProBNP (last 3 results) No results for input(s): PROBNP in the last 8760 hours.  CBG:  Recent Labs Lab 09/13/15 1329  GLUCAP 110*    Recent Results (from the past 240 hour(s))  Urine culture     Status: None (Preliminary result)   Collection Time: 09/13/15  4:27 PM  Result Value Ref Range Status   Specimen Description URINE, CATHETERIZED  Final   Special Requests NONE  Final   Culture   Final    NO GROWTH < 24 HOURS Performed at [facility]    Report Status PENDING  Incomplete     Studies: Dg Chest 2 View  09/13/2015  CLINICAL DATA:  Dizziness for 3 days. EXAM: CHEST  2 VIEW COMPARISON:  11/06/2013 FINDINGS: Mild elevation of the right hemidiaphragm with right base atelectasis. No confluent opacity on the left. Heart is borderline in size. No effusions or acute bony abnormality. IMPRESSION: Minimal right base atelectasis.  Borderline heart size. Electronically Signed   By: Rolm Baptise M.D.   On: 09/13/2015 14:21   Ct Head Wo Contrast  09/13/2015  CLINICAL DATA:  Dizziness.  Nausea. EXAM: CT HEAD WITHOUT CONTRAST TECHNIQUE:  Contiguous axial images were obtained from the base of the skull through the vertex without intravenous contrast. COMPARISON:  None. FINDINGS: There is moderate diffuse atrophy. There is no intracranial mass, hemorrhage, extra-axial fluid collection, or midline shift. There is patchy small vessel disease in the centra semiovale bilaterally. There is evidence of a prior infarct in the high anterior right parietal lobe. No acute infarct evident. The bony calvarium appears intact. The mastoid air cells are clear. There is apparent cerumen in each external auditory canal. Orbits appear symmetric and unremarkable bilaterally. There is  a retention cyst in the inferior left maxillary antrum. There is mild rightward deviation of the nasal septum. IMPRESSION: Atrophy with periventricular small vessel disease. Prior infarct in the high anterior right frontal lobe. No acute infarct evident. No hemorrhage or mass effect. Retention cyst inferior left maxillary antrum. Cerumen in each external auditory canal. Electronically Signed   By: Lowella Grip III M.D.   On: 09/13/2015 14:37   Mr Brain Wo Contrast  09/13/2015  CLINICAL DATA:  Vertigo. Dizziness with syncope and falls along with nausea for 3 days. EXAM: MRI HEAD WITHOUT CONTRAST TECHNIQUE: Multiplanar, multiecho pulse sequences of the brain and surrounding structures were obtained without intravenous contrast. COMPARISON:  Head CT 09/13/2015 FINDINGS: Study is mildly to moderately motion degraded. There is no evidence of acute infarct, intracranial hemorrhage, mass, midline shift, or extra-axial fluid collection. A small, chronic high right frontal lobe transcortical infarct is noted. Patchy T2 hyperintensities in the subcortical and deep cerebral white matter bilaterally are nonspecific but compatible with moderate chronic small vessel ischemic disease. Mild chronic small vessel ischemic changes are noted in the pons. There is moderate cerebral atrophy. Prior bilateral cataract extraction is noted. A left maxillary sinus mucous retention cyst and trace right maxillary sinus mucosal thickening are noted. There are trace mastoid effusions, left larger than right. Major intracranial vascular flow voids are preserved. IMPRESSION: 1. Motion degraded examination without evidence of acute intracranial abnormality. 2. Moderate chronic small vessel ischemic disease. 3. Chronic right frontal infarct. Electronically Signed   By: Logan Bores M.D.   On: 09/13/2015 18:42    Scheduled Meds: .  stroke: mapping our early stages of recovery book   Does not apply Once  . aspirin EC  81 mg Oral q morning  - 10a  . atorvastatin  40 mg Oral Daily  . clopidogrel  75 mg Oral QPM  . irbesartan  300 mg Oral Daily  . metoprolol succinate  25 mg Oral BID  . pantoprazole  80 mg Oral Daily  . triamcinolone  2 spray Nasal Daily   Continuous Infusions: . sodium chloride 50 mL/hr at 09/13/15 1909    Time spent: > 35 minutes   Velvet Bathe  [facility] Pager [phone] If 7PM-7AM, please contact night-coverage at [email or web address], password [facility] 09/14/2015, 2:39 PM  LOS: 1 day

## 2015-09-14 NOTE — Progress Notes (Signed)
OT Cancellation Note  Patient Details Name: [name] MRN: [id number] DOB: 06-11-1926   Cancelled Treatment:    Reason Eval/Treat Not Completed: PT screened, no needs identified, will sign off.  PT spoke to wife; pt is at [place] 09/14/2015, 12:01 PM  Lesle Chris, OTR/L [phone] 09/14/2015

## 2015-09-14 NOTE — Evaluation (Addendum)
Physical Therapy Evaluation Patient Details Name: [name] MRN: [id number] DOB: [birth year] Today's Date: 09/14/2015   History of Present Illness  82 year old male with past medical history of hypertension, GERD, coronary artery disease who presented to [facility] 09/12/14 with reports of dizziness . MRI head  negative except  chronic frontal infarct. and increased lactic acid.  Clinical Impression  *Patient  Required  Much encouragement to participate with Evaluation. Wife reports that patient is near baseline mentally and functionally. Patient will benefit from PT while in acute care to address problems listed in note below    Follow Up Recommendations No PT follow up (wife declines)    Equipment Recommendations  None recommended by PT    Recommendations for Other Services       Precautions / Restrictions Precautions Precautions: Fall Restrictions Weight Bearing Restrictions: No      Mobility  Bed Mobility Overal bed mobility: Needs Assistance Bed Mobility: Supine to Sit     Supine to sit: Supervision     General bed mobility comments: extra time to convince patient to participate  Transfers Overall transfer level: Needs assistance Equipment used: Rolling walker (2 wheeled) Transfers: Sit to/from Stand Sit to Stand: Min guard         General transfer comment: cues to participate, wife encouraging.  Ambulation/Gait Ambulation/Gait assistance: Min assist Ambulation Distance (Feet): 30 Feet Assistive device: Rolling walker (2 wheeled) Gait Pattern/deviations: Step-to pattern;Step-through pattern;Shuffle     General Gait Details: patient did ambulate a short distance in room without RW, appeared steady. wife reports patient is near/at baseline  Financial trader Rankin (Stroke Patients Only)       Balance Overall balance assessment: History of Falls;Needs assistance Sitting-balance support: Feet supported;No  upper extremity supported Sitting balance-Leahy Scale: Fair     Standing balance support: During functional activity;No upper extremity supported Standing balance-Leahy Scale: Fair                               Pertinent Vitals/Pain Pain Assessment: No/denies pain    Home Living Family/patient expects to be discharged to:: Private residence Living Arrangements: Spouse/significant other Available Help at Discharge: Family;Available 24 hours/day Type of Home: House Home Access: Stairs to enter Entrance Stairs-Rails: Right Entrance Stairs-Number of Steps: 2 Home Layout: One level Home Equipment: Walker - 2 wheels Additional Comments: wife indicates that patient does not go out, walks from  room to room , mostly sedentary.    Prior Function Level of Independence: Needs assistance               Hand Dominance        Extremity/Trunk Assessment   Upper Extremity Assessment: Generalized weakness           Lower Extremity Assessment: Generalized weakness      Cervical / Trunk Assessment: Kyphotic  Communication   Communication: Expressive difficulties  Cognition     Overall Cognitive Status: History of cognitive impairments - at baseline (per wife, he is beligerent and verbally abusive at times.)                      General Comments      Exercises        Assessment/Plan    PT Assessment    PT Diagnosis Generalized weakness;Difficulty walking   PT Problem List  PT Treatment Interventions     PT Goals (Current goals can be found in the Care Plan section) Acute Rehab PT Goals Patient Stated Goal: to get him walking, go home PT Goal Formulation: With family Time For Goal Achievement: 09/28/15 Potential to Achieve Goals: Good    Frequency     Barriers to discharge        Co-evaluation               End of Session Equipment Utilized During Treatment: Gait belt Activity Tolerance: Patient tolerated treatment  well Patient left: in chair;with call bell/phone within reach;with chair alarm set;with family/visitor present Nurse Communication: Mobility status         Time: 1020-1036 PT Time Calculation (min) (ACUTE ONLY): 16 min   Charges:   PT Evaluation $PT Eval Low Complexity: 1 Procedure     PT G CodesClaretha Cooper 09/14/2015, 11:33 AM Tresa Endo PT [phone]

## 2015-09-15 LAB — HEMOGLOBIN A1C
HEMOGLOBIN A1C: 6 % — AB (ref 4.8–5.6)
MEAN PLASMA GLUCOSE: 126 mg/dL

## 2015-09-15 NOTE — Progress Notes (Signed)
Pt discharged to home; discharge instructions explained, given, and signed; all questions answered; pt transported by wheelchair to front entrance; accompanied by family ; no complications noted

## 2015-09-15 NOTE — Discharge Summary (Signed)
Physician Discharge Summary  [name] [id number] DOB: 1926-07-08 DOA: 09/13/2015  PCP: Charolette Forward, MD  Admit date: 09/13/2015 Discharge date: 09/15/2015  Time spent: > 35 minutes  Recommendations for Outpatient Follow-up:  1. Please be sure patient follows up with ENT specialist 2. Pt declined physical therapy services   Discharge Diagnoses:  Principal Problem:   Dizziness Active Problems:   Essential hypertension   Coronary artery disease involving native coronary artery without angina pectoris   Dyslipidemia   Discharge Condition: stable  Diet recommendation: regular diet  There were no vitals filed for this visit.  History of present illness:  From original HPI: 82 year old male with past medical history of hypertension, GERD, coronary artery disease on aspirin and Plavix, dyslipidemia who presented to [facility] hospital with reports of dizziness and soon as he stands up and feeling as if he will fall down. He says he doesn't feel those symptoms while he is laying down in the bed. Patient reports no associated chest pain or shortness of breath. No lightheadedness. No falls. His episodes started a few days ago and he thought it would get better so he didn't come to the hospital.  Hospital Course:  Dizziness / possible CVA - Work up for cva negative - Could be from vestibulocochlear process. Recommended he f/u with ENT - pt feels better and refused PT services at home.  Active problems: Dyslipidemia - Continue statin therapy  Coronary artery disease - No reports of chest pain - Resume aspirin and Plavix   Essential hypertension - continue home medication regimen on d/c  Procedures:  CT of head    MRI of brain  Echocardiogram  Carotid Doppler  Consultations:  None  Discharge Exam: Filed Vitals:   09/15/15 0507 09/15/15 0942  BP: 124/77 140/69  Pulse: 65 70  Temp: 98.3 F (36.8 C) 98.5 F (36.9 C)  Resp: 18 18    General: Patient in  no acute distress, alert and awake Cardiovascular: Regular rate and rhythm, no murmurs rubs Respiratory: Clear to auscultation bilaterally, no wheezes  Discharge Instructions   Discharge Instructions    Call MD for:  extreme fatigue    Complete by:  As directed      Diet - low sodium heart healthy    Complete by:  As directed      Discharge instructions    Complete by:  As directed   Please follow up with an ENT specialist of your choice.     Increase activity slowly    Complete by:  As directed           Current Discharge Medication List    CONTINUE these medications which have NOT CHANGED   Details  aspirin EC 81 MG tablet Take 81 mg by mouth every morning.    atorvastatin (LIPITOR) 40 MG tablet Take 40 mg by mouth daily. Refills: 3    CALCIUM PO Take by mouth every evening.    clopidogrel (PLAVIX) 75 MG tablet Take 75 mg by mouth every evening.    metoprolol succinate (TOPROL-XL) 50 MG 24 hr tablet Take 25 mg by mouth 2 (two) times daily. Take with or immediately following a meal.    nitroGLYCERIN (NITROSTAT) 0.4 MG SL tablet Place 1 tablet (0.4 mg total) under the tongue every 5 (five) minutes x 3 doses as needed for chest pain. Qty: 25 tablet, Refills: 12    omeprazole (PRILOSEC) 40 MG capsule TAKE ONE CAPSULE BY MOUTH EVERY DAY Qty: 90 capsule, Refills:  3    triamcinolone (NASACORT ALLERGY 24HR) 55 MCG/ACT AERO nasal inhaler Place 2 sprays into the nose daily.    valsartan (DIOVAN) 320 MG tablet Take 0.5 tablets (160 mg total) by mouth daily. --- Needs office visit for further refills. Qty: 45 tablet, Refills: 0      STOP taking these medications     aspirin 325 MG tablet        Allergies  Allergen Reactions  . Cephalexin Other (See Comments)    Bloody loose stools      The results of significant diagnostics from this hospitalization (including imaging, microbiology, ancillary and laboratory) are listed below for reference.    Significant  Diagnostic Studies: Dg Chest 2 View  09/13/2015  CLINICAL DATA:  Dizziness for 3 days. EXAM: CHEST  2 VIEW COMPARISON:  11/06/2013 FINDINGS: Mild elevation of the right hemidiaphragm with right base atelectasis. No confluent opacity on the left. Heart is borderline in size. No effusions or acute bony abnormality. IMPRESSION: Minimal right base atelectasis.  Borderline heart size. Electronically Signed   By: Rolm Baptise M.D.   On: 09/13/2015 14:21   Ct Head Wo Contrast  09/13/2015  CLINICAL DATA:  Dizziness.  Nausea. EXAM: CT HEAD WITHOUT CONTRAST TECHNIQUE: Contiguous axial images were obtained from the base of the skull through the vertex without intravenous contrast. COMPARISON:  None. FINDINGS: There is moderate diffuse atrophy. There is no intracranial mass, hemorrhage, extra-axial fluid collection, or midline shift. There is patchy small vessel disease in the centra semiovale bilaterally. There is evidence of a prior infarct in the high anterior right parietal lobe. No acute infarct evident. The bony calvarium appears intact. The mastoid air cells are clear. There is apparent cerumen in each external auditory canal. Orbits appear symmetric and unremarkable bilaterally. There is a retention cyst in the inferior left maxillary antrum. There is mild rightward deviation of the nasal septum. IMPRESSION: Atrophy with periventricular small vessel disease. Prior infarct in the high anterior right frontal lobe. No acute infarct evident. No hemorrhage or mass effect. Retention cyst inferior left maxillary antrum. Cerumen in each external auditory canal. Electronically Signed   By: Lowella Grip III M.D.   On: 09/13/2015 14:37   Mr Brain Wo Contrast  09/13/2015  CLINICAL DATA:  Vertigo. Dizziness with syncope and falls along with nausea for 3 days. EXAM: MRI HEAD WITHOUT CONTRAST TECHNIQUE: Multiplanar, multiecho pulse sequences of the brain and surrounding structures were obtained without intravenous  contrast. COMPARISON:  Head CT 09/13/2015 FINDINGS: Study is mildly to moderately motion degraded. There is no evidence of acute infarct, intracranial hemorrhage, mass, midline shift, or extra-axial fluid collection. A small, chronic high right frontal lobe transcortical infarct is noted. Patchy T2 hyperintensities in the subcortical and deep cerebral white matter bilaterally are nonspecific but compatible with moderate chronic small vessel ischemic disease. Mild chronic small vessel ischemic changes are noted in the pons. There is moderate cerebral atrophy. Prior bilateral cataract extraction is noted. A left maxillary sinus mucous retention cyst and trace right maxillary sinus mucosal thickening are noted. There are trace mastoid effusions, left larger than right. Major intracranial vascular flow voids are preserved. IMPRESSION: 1. Motion degraded examination without evidence of acute intracranial abnormality. 2. Moderate chronic small vessel ischemic disease. 3. Chronic right frontal infarct. Electronically Signed   By: Logan Bores M.D.   On: 09/13/2015 18:42    Microbiology: Recent Results (from the past 240 hour(s))  Urine culture     Status: None  Collection Time: 09/13/15  4:27 PM  Result Value Ref Range Status   Specimen Description URINE, CATHETERIZED  Final   Special Requests NONE  Final   Culture   Final    4,000 COLONIES/mL INSIGNIFICANT GROWTH Performed at [facility]    Report Status 09/14/2015 FINAL  Final     Labs: Basic Metabolic Panel:  Recent Labs Lab 09/13/15 1217  NA 135  K 4.1  CL 103  CO2 21*  GLUCOSE 117*  BUN 11  CREATININE 1.09  CALCIUM 9.3   Liver Function Tests: No results for input(s): AST, ALT, ALKPHOS, BILITOT, PROT, ALBUMIN in the last 168 hours. No results for input(s): LIPASE, AMYLASE in the last 168 hours. No results for input(s): AMMONIA in the last 168 hours. CBC:  Recent Labs Lab 09/13/15 1217  WBC 8.1  HGB 16.2  HCT 48.3   MCV 95.6  PLT 192   Cardiac Enzymes:  Recent Labs Lab 09/13/15 1224  TROPONINI <0.03   BNP: BNP (last 3 results) No results for input(s): BNP in the last 8760 hours.  ProBNP (last 3 results) No results for input(s): PROBNP in the last 8760 hours.  CBG:  Recent Labs Lab 09/13/15 1329  GLUCAP 110*    Signed:  Velvet Bathe MD.  [facility] 09/15/2015, 1:02 PM

## 2015-10-05 ENCOUNTER — Observation Stay (HOSPITAL_COMMUNITY)
Admission: EM | Admit: 2015-10-05 | Discharge: 2015-10-08 | Disposition: A | Discharge status: Home / Self Care | Payer: Medicare Other | Attending: Cardiology | Admitting: Cardiology

## 2015-10-05 ENCOUNTER — Encounter (HOSPITAL_COMMUNITY): Payer: Self-pay | Admitting: Nurse Practitioner

## 2015-10-05 ENCOUNTER — Emergency Department (HOSPITAL_COMMUNITY): Payer: Medicare Other

## 2015-10-05 DIAGNOSIS — N182 Chronic kidney disease, stage 2 (mild): Secondary | ICD-10-CM | POA: Insufficient documentation

## 2015-10-05 DIAGNOSIS — E785 Hyperlipidemia, unspecified: Secondary | ICD-10-CM | POA: Insufficient documentation

## 2015-10-05 DIAGNOSIS — R1032 Left lower quadrant pain: Secondary | ICD-10-CM | POA: Diagnosis present

## 2015-10-05 DIAGNOSIS — I714 Abdominal aortic aneurysm, without rupture: Secondary | ICD-10-CM | POA: Insufficient documentation

## 2015-10-05 DIAGNOSIS — S7010XA Contusion of unspecified thigh, initial encounter: Secondary | ICD-10-CM | POA: Diagnosis present

## 2015-10-05 DIAGNOSIS — Z8673 Personal history of transient ischemic attack (TIA), and cerebral infarction without residual deficits: Secondary | ICD-10-CM | POA: Diagnosis not present

## 2015-10-05 DIAGNOSIS — S301XXA Contusion of abdominal wall, initial encounter: Secondary | ICD-10-CM | POA: Diagnosis not present

## 2015-10-05 DIAGNOSIS — K219 Gastro-esophageal reflux disease without esophagitis: Secondary | ICD-10-CM | POA: Insufficient documentation

## 2015-10-05 DIAGNOSIS — M7981 Nontraumatic hematoma of soft tissue: Secondary | ICD-10-CM

## 2015-10-05 DIAGNOSIS — Z8551 Personal history of malignant neoplasm of bladder: Secondary | ICD-10-CM | POA: Diagnosis not present

## 2015-10-05 DIAGNOSIS — I2510 Atherosclerotic heart disease of native coronary artery without angina pectoris: Secondary | ICD-10-CM | POA: Insufficient documentation

## 2015-10-05 DIAGNOSIS — I723 Aneurysm of iliac artery: Secondary | ICD-10-CM | POA: Diagnosis not present

## 2015-10-05 DIAGNOSIS — Z87891 Personal history of nicotine dependence: Secondary | ICD-10-CM | POA: Diagnosis not present

## 2015-10-05 DIAGNOSIS — X58XXXA Exposure to other specified factors, initial encounter: Secondary | ICD-10-CM | POA: Diagnosis not present

## 2015-10-05 DIAGNOSIS — I129 Hypertensive chronic kidney disease with stage 1 through stage 4 chronic kidney disease, or unspecified chronic kidney disease: Secondary | ICD-10-CM | POA: Diagnosis not present

## 2015-10-05 DIAGNOSIS — I252 Old myocardial infarction: Secondary | ICD-10-CM | POA: Insufficient documentation

## 2015-10-05 DIAGNOSIS — D62 Acute posthemorrhagic anemia: Secondary | ICD-10-CM | POA: Insufficient documentation

## 2015-10-05 LAB — COMPREHENSIVE METABOLIC PANEL
ALBUMIN: 3.6 g/dL (ref 3.5–5.0)
ALK PHOS: 53 U/L (ref 38–126)
ALT: 12 U/L — AB (ref 17–63)
ANION GAP: 12 (ref 5–15)
AST: 23 U/L (ref 15–41)
BUN: 12 mg/dL (ref 6–20)
CALCIUM: 8.7 mg/dL — AB (ref 8.9–10.3)
CO2: 20 mmol/L — AB (ref 22–32)
Chloride: 104 mmol/L (ref 101–111)
Creatinine, Ser: 1.07 mg/dL (ref 0.61–1.24)
GFR calc Af Amer: >60 mL/min (ref >60)
GFR calc non Af Amer: 60 mL/min — ABNORMAL LOW (ref >60)
GLUCOSE: 165 mg/dL — AB (ref 65–99)
Potassium: 3.8 mmol/L (ref 3.5–5.1)
SODIUM: 136 mmol/L (ref 135–145)
Total Bilirubin: 0.6 mg/dL (ref 0.3–1.2)
Total Protein: 6.1 g/dL — ABNORMAL LOW (ref 6.5–8.1)

## 2015-10-05 LAB — CBC WITH DIFFERENTIAL/PLATELET
BASOS ABS: 0 10*3/uL (ref 0.0–0.1)
BASOS PCT: 0 %
EOS ABS: 0 10*3/uL (ref 0.0–0.7)
Eosinophils Relative: 0 %
HCT: 44.4 % (ref 39.0–52.0)
HEMOGLOBIN: 14.6 g/dL (ref 13.0–17.0)
Lymphocytes Relative: 12 %
Lymphs Abs: 1.8 10*3/uL (ref 0.7–4.0)
MCH: 31.5 pg (ref 26.0–34.0)
MCHC: 32.9 g/dL (ref 30.0–36.0)
MCV: 95.9 fL (ref 78.0–100.0)
Monocytes Absolute: 0.8 10*3/uL (ref 0.1–1.0)
Monocytes Relative: 6 %
NEUTROS ABS: 11.9 10*3/uL — AB (ref 1.7–7.7)
NEUTROS PCT: 82 %
Platelets: 199 10*3/uL (ref 150–400)
RBC: 4.63 MIL/uL (ref 4.22–5.81)
RDW: 13.7 % (ref 11.5–15.5)
WBC: 14.6 10*3/uL — AB (ref 4.0–10.5)

## 2015-10-05 LAB — LIPASE, BLOOD: Lipase: 30 U/L (ref 11–51)

## 2015-10-05 LAB — URINALYSIS, ROUTINE W REFLEX MICROSCOPIC
Glucose, UA: NEGATIVE mg/dL
Hgb urine dipstick: NEGATIVE
KETONES UR: NEGATIVE mg/dL
LEUKOCYTES UA: NEGATIVE
NITRITE: NEGATIVE
Protein, ur: NEGATIVE mg/dL
Specific Gravity, Urine: 1.027 (ref 1.005–1.030)
pH: 5.5 (ref 5.0–8.0)

## 2015-10-05 LAB — POC OCCULT BLOOD, ED: Fecal Occult Bld: NEGATIVE

## 2015-10-05 LAB — I-STAT CG4 LACTIC ACID, ED: Lactic Acid, Venous: 3.03 mmol/L (ref 0.5–2.0)

## 2015-10-05 MED ORDER — SODIUM CHLORIDE 0.9 % IV BOLUS (SEPSIS)
1000.0000 mL | Freq: Once | INTRAVENOUS | Status: AC
  Administered 2015-10-06: 1000 mL via INTRAVENOUS

## 2015-10-05 MED ORDER — MORPHINE SULFATE (PF) 4 MG/ML IV SOLN
4.0000 mg | Freq: Once | INTRAVENOUS | Status: AC
  Administered 2015-10-06: 4 mg via INTRAVENOUS
  Filled 2015-10-05: qty 1

## 2015-10-05 NOTE — ED Provider Notes (Signed)
CSN: [id number]     Arrival date & time 10/05/15  2106 History   First MD Initiated Contact with Patient 10/05/15 2127     Chief Complaint  Patient presents with  . Flank Pain    Left flank     (Consider location/radiation/quality/duration/timing/severity/associated sxs/prior Treatment) HPI   Patient is an 82 year old male presenting with left lower quadrant pain starting today 4 PM. Patient reports that he's had intermittent pain for last day. However got much worse in the last 6 hours. Patient denies any diarrhea. Endorses constipation. He does report dark stools occasionally. Not on take Pepto-Bismol or iron. Patient denies any urinary symptoms. Denies any pain with urination, fever, burning. Denies any flank pain or radiation of pain.  Patient's been eating and drinking normally. He has had no nausea vomiting. He has had no fevers, cough,congestion.  History reviewed. No pertinent past medical history. Past Surgical History  Procedure Laterality Date  . Tonsillectomy    . Colonoscopy  2007    Diverticulosis  . Transurethral resection of bladder tumor  07/27/2011    Procedure: TRANSURETHRAL RESECTION OF BLADDER TUMOR (TURBT);  Surgeon: Malka So;  Location: [facility];  Service: Urology;  Laterality: N/A;  Cystoscopy/Transurethral Resection of Bladder Tumor  . Ercp  02/01/2012    Procedure: ENDOSCOPIC RETROGRADE CHOLANGIOPANCREATOGRAPHY (ERCP);  Surgeon: Beryle Beams, MD;  Location: [facility];  Service: Endoscopy;  Laterality: N/A;  . Cataract extraction w/ intraocular lens  implant, bilateral Bilateral   . Laparoscopic cholecystectomy  06-27-2007    Dr Johnathan Hausen  . Cystoscopy w/ retrogrades Bilateral 12/26/2012    Procedure: CYSTOSCOPY WITH Bilateral RETROGRADE PYELOGRAM;  Surgeon: Malka So, MD;  Location: [facility];  Service: Urology;  Laterality: Bilateral;  BLADDER BIOPSY  . Fulguration of bladder tumor N/A 12/26/2012    Procedure: Multiple Bladder  Biopsy;  Surgeon: Malka So, MD;  Location: [facility];  Service: Urology;  Laterality: N/A;  . Coronary angioplasty with stent placement  11/06/2013    "1"  . Esophagogastroduodenoscopy (egd) with propofol N/A 01/29/2014    Procedure: ESOPHAGOGASTRODUODENOSCOPY (EGD) WITH PROPOFOL;  Surgeon: Inda Castle, MD;  Location: [facility];  Service: Endoscopy;  Laterality: N/A;  . Left heart catheterization with coronary angiogram N/A 11/06/2013    Procedure: LEFT HEART CATHETERIZATION WITH CORONARY ANGIOGRAM;  Surgeon: Clent Demark, MD;  Location: [facility];  Service: Cardiovascular;  Laterality: N/A;   Family History  Problem Relation Age of Onset  . Heart attack Father 78  . Colon cancer Mother 30  . Hypertension Mother   . Breast cancer Maternal Grandmother   . Heart attack Maternal Grandfather     late 81s  . Heart failure Sister   . Diabetes Neg Hx    Social History  Substance Use Topics  . Smoking status: Former Smoker -- 1.00 packs/day for 31 years    Types: Cigarettes    Quit date: 08/21/1973  . Smokeless tobacco: Never Used     Comment: smoked  1944-1975 , up to 1 ppd  . Alcohol Use: 1.2 oz/week    2 Glasses of wine per week     Comment: 11/06/2013 "glass of wine maybe twice/wk"    Review of Systems  Constitutional: Negative for activity change.  HENT: Negative for congestion.   Respiratory: Negative for shortness of breath.   Cardiovascular: Negative for chest pain.  Gastrointestinal: Positive for abdominal pain. Negative for diarrhea and constipation.  Genitourinary: Negative  for dysuria.  Musculoskeletal: Negative for back pain.  Neurological: Negative for dizziness.  Psychiatric/Behavioral: Negative for agitation.      Allergies  Cephalexin  Home Medications   Prior to Admission medications   Medication Sig Start Date End Date Taking? Authorizing Provider  aspirin EC 81 MG tablet Take 81 mg by mouth every morning.    Historical  Provider, MD  atorvastatin (LIPITOR) 40 MG tablet Take 40 mg by mouth daily. 08/20/15   Historical Provider, MD  CALCIUM PO Take by mouth every evening.    Historical Provider, MD  clopidogrel (PLAVIX) 75 MG tablet Take 75 mg by mouth every evening.    Historical Provider, MD  metoprolol succinate (TOPROL-XL) 50 MG 24 hr tablet Take 25 mg by mouth 2 (two) times daily. Take with or immediately following a meal. 12/19/13   Hendricks Limes, MD  nitroGLYCERIN (NITROSTAT) 0.4 MG SL tablet Place 1 tablet (0.4 mg total) under the tongue every 5 (five) minutes x 3 doses as needed for chest pain. 11/08/13   Charolette Forward, MD  omeprazole (PRILOSEC) 40 MG capsule TAKE ONE CAPSULE BY MOUTH EVERY DAY Patient taking differently: Take 1 capsule (40 mg) by mouth every day. 02/12/15   Amy S Esterwood, PA-C  triamcinolone (NASACORT ALLERGY 24HR) 55 MCG/ACT AERO nasal inhaler Place 2 sprays into the nose daily.    Historical Provider, MD  valsartan (DIOVAN) 320 MG tablet Take 0.5 tablets (160 mg total) by mouth daily. --- Needs office visit for further refills. Patient taking differently: Take 160 mg by mouth every evening. --- Needs office visit for further refills. 06/18/15   Hendricks Limes, MD   BP 131/79 mmHg  Pulse 71  Temp(Src) 98.1 F (36.7 C) (Oral)  Resp 18  SpO2 96% Physical Exam  Constitutional: He is oriented to person, place, and time. He appears well-nourished.  HENT:  Head: Normocephalic.  Mouth/Throat: Oropharynx is clear and moist.  Eyes: Conjunctivae are normal.  Neck: No tracheal deviation present.  Cardiovascular: Normal rate.   Pulmonary/Chest: Effort normal. No stridor. No respiratory distress.  Abdominal: Soft. There is tenderness. There is no guarding.  Tenderness to LLQ  Musculoskeletal: Normal range of motion. He exhibits no edema.  Neurological: He is oriented to person, place, and time. No cranial nerve deficit.  Skin: Skin is warm and dry. No rash noted. He is not diaphoretic.   Psychiatric: He has a normal mood and affect.  Nursing note and vitals reviewed.   ED Course  Procedures (including critical care time) Labs Review Labs Reviewed  URINE CULTURE  URINALYSIS, ROUTINE W REFLEX MICROSCOPIC (NOT AT [facility])  CBC WITH DIFFERENTIAL/PLATELET  COMPREHENSIVE METABOLIC PANEL  LIPASE, BLOOD  I-STAT CG4 LACTIC ACID, ED    Imaging Review No results found. I have personally reviewed and evaluated these images and lab results as part of my medical decision-making.   EKG Interpretation None      MDM   Final diagnoses:  None    Patient is an 82 year old male presenting with left lower quadrant pain. Patient denies any bowel symptoms such as diarrhea. Does endorse constipation, black stools. Occult card sent to lab. Additionally during triage it appeared that the symptoms were more urinary. However further discussion appears like it is more left lower quadrant.  Labs show mild white count, elevated lactate.  Concerned for mesenteric ischmeia, will get CT CTA of abdomen.  Will give pain medication and 1 L NS.  Signed out to provider for follow up  CT>   Eleina Jergens Julio Alm, MD 10/06/15 1537

## 2015-10-05 NOTE — ED Notes (Signed)
Pt is presented from home c/o left flank pain, radiating to his CVA region at the back. Denies fever, chills or dysuria.

## 2015-10-05 NOTE — ED Notes (Signed)
Bed: [facility] Expected date:  Expected time:  Means of arrival:  Comments: 82 yr old EMS

## 2015-10-05 NOTE — ED Notes (Signed)
EDP given lactic result. 

## 2015-10-06 ENCOUNTER — Encounter (HOSPITAL_COMMUNITY): Payer: Self-pay

## 2015-10-06 DIAGNOSIS — S301XXA Contusion of abdominal wall, initial encounter: Secondary | ICD-10-CM | POA: Diagnosis not present

## 2015-10-06 DIAGNOSIS — S7010XA Contusion of unspecified thigh, initial encounter: Secondary | ICD-10-CM | POA: Diagnosis present

## 2015-10-06 LAB — I-STAT CG4 LACTIC ACID, ED: LACTIC ACID, VENOUS: 2.21 mmol/L — AB (ref 0.5–2.0)

## 2015-10-06 LAB — MRSA PCR SCREENING: MRSA by PCR: NEGATIVE

## 2015-10-06 MED ORDER — ATORVASTATIN CALCIUM 40 MG PO TABS
40.0000 mg | ORAL_TABLET | Freq: Every day | ORAL | Status: DC
  Administered 2015-10-06 – 2015-10-08 (×3): 40 mg via ORAL
  Filled 2015-10-06 (×4): qty 1

## 2015-10-06 MED ORDER — SODIUM CHLORIDE 0.9 % IV SOLN
INTRAVENOUS | Status: DC
  Administered 2015-10-06: 10:00:00 via INTRAVENOUS

## 2015-10-06 MED ORDER — HYDROMORPHONE HCL 1 MG/ML IJ SOLN
1.0000 mg | Freq: Once | INTRAMUSCULAR | Status: AC
  Administered 2015-10-06: 1 mg via INTRAVENOUS
  Filled 2015-10-06: qty 1

## 2015-10-06 MED ORDER — IOHEXOL 350 MG/ML SOLN
100.0000 mL | Freq: Once | INTRAVENOUS | Status: AC | PRN
  Administered 2015-10-06: 100 mL via INTRAVENOUS

## 2015-10-06 MED ORDER — METOPROLOL SUCCINATE ER 25 MG PO TB24
25.0000 mg | ORAL_TABLET | Freq: Every day | ORAL | Status: DC
  Administered 2015-10-07 – 2015-10-08 (×2): 25 mg via ORAL
  Filled 2015-10-06 (×3): qty 1

## 2015-10-06 MED ORDER — BISACODYL 5 MG PO TBEC
5.0000 mg | DELAYED_RELEASE_TABLET | Freq: Every day | ORAL | Status: DC | PRN
  Filled 2015-10-06: qty 1

## 2015-10-06 MED ORDER — ONDANSETRON HCL 4 MG/2ML IJ SOLN
4.0000 mg | Freq: Once | INTRAMUSCULAR | Status: AC
  Administered 2015-10-06: 4 mg via INTRAVENOUS
  Filled 2015-10-06: qty 2

## 2015-10-06 MED ORDER — NITROGLYCERIN 0.4 MG SL SUBL: 0.4000 mg | SUBLINGUAL_TABLET | SUBLINGUAL | Status: DC | PRN

## 2015-10-06 MED ORDER — PANTOPRAZOLE SODIUM 40 MG PO TBEC
40.0000 mg | DELAYED_RELEASE_TABLET | Freq: Every day | ORAL | Status: DC
  Administered 2015-10-06 – 2015-10-08 (×3): 40 mg via ORAL
  Filled 2015-10-06 (×3): qty 1

## 2015-10-06 MED ORDER — MORPHINE SULFATE (PF) 2 MG/ML IV SOLN: 2.0000 mg | INTRAVENOUS | Status: DC | PRN

## 2015-10-06 NOTE — Care Management Note (Signed)
Case Management Note  Patient Details  Name: HOYE VANDERMEER MRN: [id number] Date of Birth: [birth year]  Subjective/Objective: 82 y/o m admitted w/Psoas hematoma. From home.                   Action/Plan:d/c plan home.   Expected Discharge Date:   (unknown)               Expected Discharge Plan:  Home/Self Care  In-House Referral:     Discharge planning Services  CM Consult  Post Acute Care Choice:    Choice offered to:     DME Arranged:    DME Agency:     HH Arranged:    HH Agency:     Status of Service:  In process, will continue to follow  Medicare Important Message Given:    Date Medicare IM Given:    Medicare IM give by:    Date Additional Medicare IM Given:    Additional Medicare Important Message give by:     If discussed at [place] of Stay Meetings, dates discussed:    Additional Comments:  Dessa Phi, RN 10/06/2015, 3:46 PM

## 2015-10-06 NOTE — ED Notes (Signed)
Informed Dr. Claudine Mouton of lactic acid of 2.21

## 2015-10-06 NOTE — ED Notes (Signed)
Pt can go up at 15:10

## 2015-10-06 NOTE — H&P (Signed)
[name] is an 82 y.o. male.   Chief Complaint: left flank pain HPI: patient is 82 year old male with past medical history significant for coronary artery diseasehistory of inferior wall myocardial infarction approximately 2 years ago status post PTCA stenting to RCA,, hypertension, hyperlipidemia, remote tobacco abuse, history of cancer of bladder in the past,chronic kidney disease stage II,history of cerebrovascular disease, came to the ER by EMS complaining of left flank pain for last 2 days yesterday pain got worse so decided to come to the ED patient denies any history of all or trauma. Denies any urinary complaints complains of occasional constipation workup in the ED showed small abdominal aortic aneurysm and right iliac aneurysm and large left iliopsoas and left flank hematoma. Patient is admitted for close observation and pain management.  History reviewed. No pertinent past medical history.  Past Surgical History  Procedure Laterality Date  . Tonsillectomy    . Colonoscopy  2007    Diverticulosis  . Transurethral resection of bladder tumor  07/27/2011    Procedure: TRANSURETHRAL RESECTION OF BLADDER TUMOR (TURBT);  Surgeon: Malka So;  Location: [facility];  Service: Urology;  Laterality: N/A;  Cystoscopy/Transurethral Resection of Bladder Tumor  . Ercp  02/01/2012    Procedure: ENDOSCOPIC RETROGRADE CHOLANGIOPANCREATOGRAPHY (ERCP);  Surgeon: Beryle Beams, MD;  Location: [facility];  Service: Endoscopy;  Laterality: N/A;  . Cataract extraction w/ intraocular lens  implant, bilateral Bilateral   . Laparoscopic cholecystectomy  06-27-2007    Dr Johnathan Hausen  . Cystoscopy w/ retrogrades Bilateral 12/26/2012    Procedure: CYSTOSCOPY WITH Bilateral RETROGRADE PYELOGRAM;  Surgeon: Malka So, MD;  Location: [facility];  Service: Urology;  Laterality: Bilateral;  BLADDER BIOPSY  . Fulguration of bladder tumor N/A 12/26/2012    Procedure: Multiple Bladder Biopsy;   Surgeon: Malka So, MD;  Location: [facility];  Service: Urology;  Laterality: N/A;  . Coronary angioplasty with stent placement  11/06/2013    "1"  . Esophagogastroduodenoscopy (egd) with propofol N/A 01/29/2014    Procedure: ESOPHAGOGASTRODUODENOSCOPY (EGD) WITH PROPOFOL;  Surgeon: Inda Castle, MD;  Location: [facility];  Service: Endoscopy;  Laterality: N/A;  . Left heart catheterization with coronary angiogram N/A 11/06/2013    Procedure: LEFT HEART CATHETERIZATION WITH CORONARY ANGIOGRAM;  Surgeon: Clent Demark, MD;  Location: [facility];  Service: Cardiovascular;  Laterality: N/A;    Family History  Problem Relation Age of Onset  . Heart attack Father 78  . Colon cancer Mother 76  . Hypertension Mother   . Breast cancer Maternal Grandmother   . Heart attack Maternal Grandfather     late 41s  . Heart failure Sister   . Diabetes Neg Hx    Social History:  reports that he quit smoking about 42 years ago. His smoking use included Cigarettes. He has a 31 pack-year smoking history. He has never used smokeless tobacco. He reports that he drinks about 1.2 oz of alcohol per week. He reports that he does not use illicit drugs.  Allergies:  Allergies  Allergen Reactions  . Cephalexin Other (See Comments)    Bloody loose stools     (Not in a hospital admission)  Results for orders placed or performed during the hospital encounter of 10/05/15 (from the past 48 hour(s))  Urinalysis, Routine w reflex microscopic-may I&O cath if menses (not at [facility])     Status: Abnormal   Collection Time: 10/05/15  9:43 PM  Result Value  Ref Range   Color, Urine AMBER (A) YELLOW    Comment: BIOCHEMICALS MAY BE AFFECTED BY COLOR   APPearance CLEAR CLEAR   Specific Gravity, Urine 1.027 1.005 - 1.030   pH 5.5 5.0 - 8.0   Glucose, UA NEGATIVE NEGATIVE mg/dL   Hgb urine dipstick NEGATIVE NEGATIVE   Bilirubin Urine SMALL (A) NEGATIVE   Ketones, ur NEGATIVE NEGATIVE mg/dL    Protein, ur NEGATIVE NEGATIVE mg/dL   Nitrite NEGATIVE NEGATIVE   Leukocytes, UA NEGATIVE NEGATIVE    Comment: MICROSCOPIC NOT DONE ON URINES WITH NEGATIVE PROTEIN, BLOOD, LEUKOCYTES, NITRITE, OR GLUCOSE <1000 mg/dL.  POC occult blood, ED     Status: None   Collection Time: 10/05/15 10:09 PM  Result Value Ref Range   Fecal Occult Bld NEGATIVE NEGATIVE  CBC with Differential/Platelet     Status: Abnormal   Collection Time: 10/05/15 10:20 PM  Result Value Ref Range   WBC 14.6 (H) 4.0 - 10.5 K/uL   RBC 4.63 4.22 - 5.81 MIL/uL   Hemoglobin 14.6 13.0 - 17.0 g/dL   HCT 44.4 39.0 - 52.0 %   MCV 95.9 78.0 - 100.0 fL   MCH 31.5 26.0 - 34.0 pg   MCHC 32.9 30.0 - 36.0 g/dL   RDW 13.7 11.5 - 15.5 %   Platelets 199 150 - 400 K/uL   Neutrophils Relative % 82 %   Neutro Abs 11.9 (H) 1.7 - 7.7 K/uL   Lymphocytes Relative 12 %   Lymphs Abs 1.8 0.7 - 4.0 K/uL   Monocytes Relative 6 %   Monocytes Absolute 0.8 0.1 - 1.0 K/uL   Eosinophils Relative 0 %   Eosinophils Absolute 0.0 0.0 - 0.7 K/uL   Basophils Relative 0 %   Basophils Absolute 0.0 0.0 - 0.1 K/uL  Comprehensive metabolic panel     Status: Abnormal   Collection Time: 10/05/15 10:20 PM  Result Value Ref Range   Sodium 136 135 - 145 mmol/L   Potassium 3.8 3.5 - 5.1 mmol/L   Chloride 104 101 - 111 mmol/L   CO2 20 (L) 22 - 32 mmol/L   Glucose, Bld 165 (H) 65 - 99 mg/dL   BUN 12 6 - 20 mg/dL   Creatinine, Ser 1.07 0.61 - 1.24 mg/dL   Calcium 8.7 (L) 8.9 - 10.3 mg/dL   Total Protein 6.1 (L) 6.5 - 8.1 g/dL   Albumin 3.6 3.5 - 5.0 g/dL   AST 23 15 - 41 U/L   ALT 12 (L) 17 - 63 U/L   Alkaline Phosphatase 53 38 - 126 U/L   Total Bilirubin 0.6 0.3 - 1.2 mg/dL   GFR calc non Af Amer 60 (L) >60 mL/min   GFR calc Af Amer >60 >60 mL/min    Comment: (NOTE) The eGFR has been calculated using the CKD EPI equation. This calculation has not been validated in all clinical situations. eGFR's persistently <60 mL/min signify possible Chronic  Kidney Disease.    Anion gap 12 5 - 15  Lipase, blood     Status: None   Collection Time: 10/05/15 10:20 PM  Result Value Ref Range   Lipase 30 11 - 51 U/L  I-Stat CG4 Lactic Acid, ED     Status: Abnormal   Collection Time: 10/05/15 10:27 PM  Result Value Ref Range   Lactic Acid, Venous 3.03 (HH) 0.5 - 2.0 mmol/L   Comment NOTIFIED PHYSICIAN   I-Stat CG4 Lactic Acid, ED     Status: Abnormal  Collection Time: 10/06/15  5:02 AM  Result Value Ref Range   Lactic Acid, Venous 2.21 (HH) 0.5 - 2.0 mmol/L   Comment NOTIFIED PHYSICIAN    Ct Cta Abd/pel W/cm &/or W/o Cm  10/06/2015  CLINICAL DATA:  Left lower quadrant pain since 1600 hours. Constipation. Dark stools. Elevated white cell count. Elevated lactate. EXAM: CTA ABDOMEN AND PELVIS wITHOUT AND WITH CONTRAST TECHNIQUE: Multidetector CT imaging of the abdomen and pelvis was performed using the standard protocol during bolus administration of intravenous contrast. Multiplanar reconstructed images and MIPs were obtained and reviewed to evaluate the vascular anatomy. CONTRAST:  100 mL Omnipaque 350 COMPARISON:  04/09/2014 FINDINGS: Fibrosis in the lung bases.  Coronary artery calcifications. Images obtained of the abdomen and pelvis during arterial phase of contrast bolus enhancement demonstrate small focal aneurysm of the abdominal aorta with mild calcification and mural thrombus inferiorly. Maximal AP diameter is 3.2 cm. 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 No aortic dissection. The abdominal aorta, celiac axis, superior mesenteric artery, single right and duplicated left renal arteries, inferior mesenteric artery, and bilateral iliac, external iliac, internal iliac, and common femoral arteries are patent. Focal stenosis is demonstrated at the origin of the celiac axis representing 50% diameter reduction. Renal nephrograms are symmetrical. The liver, spleen, pancreas, adrenal glands,  inferior vena cava, and retroperitoneal lymph nodes are unremarkable. Surgical absence of the gallbladder. No bile duct dilatation. Multiple cysts in both kidneys. No hydronephrosis. Small esophageal hiatal hernia. Stomach, small bowel, and colon are not abnormally distended. Contrast material flows through to the cecum without evidence of bowel obstruction. No bowel wall thickening. Scattered stool in the colon. No free air or free fluid in the abdomen. Pelvis: There is a left iliopsoas hematoma extending to involve the left flank muscles and measuring about 8.8 x 15.6 cm. No active contrast extravasation is noted. Diverticulosis of the sigmoid colon without evidence of diverticulitis. Bladder wall is not thickened. Prostate gland is mildly enlarged, measuring 5.2 cm diameter. Appendix is not identified. Fat in the inguinal canals bilaterally. Diffuse degenerative change throughout the lumbar spine. No destructive bone lesions. No acute displaced fractures identified. Review of the MIP images confirms the above findings. IMPRESSION: 3.2 cm abdominal aortic aneurysm. Aorta and major branch vessels are patent. Atherosclerotic changes. 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 Narrowing of the origin of the celiac axis. Large left iliopsoas and left flank hematoma. Electronically Signed   By: Lucienne Capers M.D.   On: 10/06/2015 01:17    Review of Systems  Constitutional: Negative for fever and chills.  Eyes: Negative for pain.  Respiratory: Negative for cough, hemoptysis and shortness of breath.   Cardiovascular: Negative for chest pain, palpitations and orthopnea.  Gastrointestinal: Positive for abdominal pain and constipation. Negative for nausea and vomiting.  Genitourinary: Negative for dysuria and urgency.  Neurological: Negative for dizziness and headaches.    Blood pressure 86/54, pulse 67, temperature 97.9 F (36.6 C), temperature source Oral, resp. rate 13, SpO2 94 %. Physical  Exam  Constitutional: He is oriented to person, place, and time.  HENT:  Head: Normocephalic and atraumatic.  Eyes: Conjunctivae are normal. Pupils are equal, round, and reactive to light. Left eye exhibits no discharge.  Neck: Normal range of motion. Neck supple. No JVD present. No tracheal deviation present. No thyromegaly present.  Cardiovascular: Normal rate and regular rhythm.   Murmur (soft systolic murmur noted no S3 gallop) heard. Respiratory: Effort  normal and breath sounds normal. No respiratory distress. He has no wheezes. He has no rales.  GI: Soft. Bowel sounds are normal. He exhibits distension (Left flank fullness with  mild tendernessno ecchymosis). There is tenderness. There is no rebound and no guarding.  Musculoskeletal: He exhibits no edema or tenderness.  Neurological: He is alert and oriented to person, place, and time.     Assessment/Plan Large left iliopsoas and left flank hematoma Coronary artery disease history of inferior wall MI in the past status post PCI to RCA in the past stable Hypertension Hyperlipidemia GERD History of lacunar infarct in the past Remote tobacco abuse History of CA of bladder plan  As per orders  Charolette Forward, MD 10/06/2015, 7:45 AM

## 2015-10-07 DIAGNOSIS — S301XXA Contusion of abdominal wall, initial encounter: Secondary | ICD-10-CM | POA: Diagnosis not present

## 2015-10-07 LAB — URINE CULTURE

## 2015-10-07 LAB — BASIC METABOLIC PANEL
Anion gap: 8 (ref 5–15)
BUN: 16 mg/dL (ref 6–20)
CALCIUM: 8.3 mg/dL — AB (ref 8.9–10.3)
CHLORIDE: 103 mmol/L (ref 101–111)
CO2: 25 mmol/L (ref 22–32)
CREATININE: 1.05 mg/dL (ref 0.61–1.24)
GFR calc Af Amer: >60 mL/min (ref >60)
GFR calc non Af Amer: >60 mL/min (ref >60)
GLUCOSE: 113 mg/dL — AB (ref 65–99)
Potassium: 4 mmol/L (ref 3.5–5.1)
Sodium: 136 mmol/L (ref 135–145)

## 2015-10-07 LAB — CBC
HEMATOCRIT: 33.9 % — AB (ref 39.0–52.0)
HEMOGLOBIN: 11.2 g/dL — AB (ref 13.0–17.0)
MCH: 32 pg (ref 26.0–34.0)
MCHC: 33 g/dL (ref 30.0–36.0)
MCV: 96.9 fL (ref 78.0–100.0)
Platelets: 164 10*3/uL (ref 150–400)
RBC: 3.5 MIL/uL — ABNORMAL LOW (ref 4.22–5.81)
RDW: 14.1 % (ref 11.5–15.5)
WBC: 9.4 10*3/uL (ref 4.0–10.5)

## 2015-10-07 NOTE — Care Management Obs Status (Signed)
[facility] NOTIFICATION   Patient Details  Name: HUESTON MARGRAVE MRN: [id number] Date of Birth: 03-Jun-1926   Medicare Observation Status Notification Given:  Yes    MahabirJuliann Pulse, RN 10/07/2015, 12:53 PM

## 2015-10-07 NOTE — Progress Notes (Signed)
Subjective:  Patient states left flank pain has improved. Denies any abdominal pain. Denies any urinary complaints. Denies chest pain.  Objective:  Vital Signs in the last 24 hours: Temp:  [98 F (36.7 C)-98.7 F (37.1 C)] 98.5 F (36.9 C) (02/16 0658) Pulse Rate:  [65-92] 92 (02/16 0658) Resp:  [10-22] 18 (02/16 0658) BP: (83-123)/(44-87) 123/55 mmHg (02/16 0658) SpO2:  [92 %-96 %] 94 % (02/16 0658) Weight:  [88.451 kg (195 lb)] 88.451 kg (195 lb) (02/15 1534)  Intake/Output from previous day: 02/15 0701 - 02/16 0700 In: 1037.5 [I.V.:1037.5] Out: 100 [Urine:100] Intake/Output from this shift:    Physical Exam: Neck: no adenopathy, no carotid bruit, no JVD and supple, symmetrical, trachea midline Lungs: clear to auscultation bilaterally Heart: regular rate and rhythm, S1, S2 normal and Soft systolic murmur noted Abdomen: Soft distended left flank fullness with large area of ecchymosis noted Extremities: extremities normal, atraumatic, no cyanosis or edema  Lab Results:  Recent Labs  10/05/15 2220 10/07/15 0509  WBC 14.6* 9.4  HGB 14.6 11.2*  PLT 199 164    Recent Labs  10/05/15 2220 10/07/15 0509  NA 136 136  K 3.8 4.0  CL 104 103  CO2 20* 25  GLUCOSE 165* 113*  BUN 12 16  CREATININE 1.07 1.05   No results for input(s): TROPONINI in the last 72 hours.  Invalid input(s): CK, MB Hepatic Function Panel  Recent Labs  10/05/15 2220  PROT 6.1*  ALBUMIN 3.6  AST 23  ALT 12*  ALKPHOS 53  BILITOT 0.6   No results for input(s): CHOL in the last 72 hours. No results for input(s): PROTIME in the last 72 hours.  Imaging: Imaging results have been reviewed and Ct Cta Abd/pel W/cm &/or W/o Cm  10/06/2015  CLINICAL DATA:  Left lower quadrant pain since 1600 hours. Constipation. Dark stools. Elevated white cell count. Elevated lactate. EXAM: CTA ABDOMEN AND PELVIS wITHOUT AND WITH CONTRAST TECHNIQUE: Multidetector CT imaging of the abdomen and pelvis was  performed using the standard protocol during bolus administration of intravenous contrast. Multiplanar reconstructed images and MIPs were obtained and reviewed to evaluate the vascular anatomy. CONTRAST:  100 mL Omnipaque 350 COMPARISON:  04/09/2014 FINDINGS: Fibrosis in the lung bases.  Coronary artery calcifications. Images obtained of the abdomen and pelvis during arterial phase of contrast bolus enhancement demonstrate small focal aneurysm of the abdominal aorta with mild calcification and mural thrombus inferiorly. Maximal AP diameter is 3.2 cm. 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 No aortic dissection. The abdominal aorta, celiac axis, superior mesenteric artery, single right and duplicated left renal arteries, inferior mesenteric artery, and bilateral iliac, external iliac, internal iliac, and common femoral arteries are patent. Focal stenosis is demonstrated at the origin of the celiac axis representing 50% diameter reduction. Renal nephrograms are symmetrical. The liver, spleen, pancreas, adrenal glands, inferior vena cava, and retroperitoneal lymph nodes are unremarkable. Surgical absence of the gallbladder. No bile duct dilatation. Multiple cysts in both kidneys. No hydronephrosis. Small esophageal hiatal hernia. Stomach, small bowel, and colon are not abnormally distended. Contrast material flows through to the cecum without evidence of bowel obstruction. No bowel wall thickening. Scattered stool in the colon. No free air or free fluid in the abdomen. Pelvis: There is a left iliopsoas hematoma extending to involve the left flank muscles and measuring about 8.8 x 15.6 cm. No active contrast extravasation is noted. Diverticulosis of the sigmoid colon without evidence  of diverticulitis. Bladder wall is not thickened. Prostate gland is mildly enlarged, measuring 5.2 cm diameter. Appendix is not identified. Fat in the inguinal canals bilaterally.  Diffuse degenerative change throughout the lumbar spine. No destructive bone lesions. No acute displaced fractures identified. Review of the MIP images confirms the above findings. IMPRESSION: 3.2 cm abdominal aortic aneurysm. Aorta and major branch vessels are patent. Atherosclerotic changes. 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 Narrowing of the origin of the celiac axis. Large left iliopsoas and left flank hematoma. Electronically Signed   By: Lucienne Capers M.D.   On: 10/06/2015 01:17    Cardiac Studies:  Assessment/Plan:  Large left iliopsoas and left flank hematoma Coronary artery disease history of inferior wall MI in the past status post PCI to RCA in the past stable Hypertension Hyperlipidemia GERD History of lacunar infarct in the past Remote tobacco abuse History of CA of bladder  Acute blood loss anemia secondary to 1 Plan DC IV fluids changed to Hep-Lock Check CBC in a.m.     Charolette Forward 10/07/2015, 9:14 AM

## 2015-10-07 NOTE — Care Management Note (Signed)
Case Management Note  Patient Details  Name: RAPHIEL COCKAYNE MRN: [id number] Date of Birth: 1926/01/02  Subjective/Objective:   Attempted to talk to patient & spouse answers all questions.  She states he has a cane, & rw @ home,has gone through home PT but didn't do the therapy,& he would not do therapy again, so pleasantly declines PT inhouse or @ home per spouse.Nsg aware.                 Action/Plan:d/c plan home.   Expected Discharge Date:   (unknown)               Expected Discharge Plan:  Home/Self Care  In-House Referral:     Discharge planning Services  CM Consult  Post Acute Care Choice:    Choice offered to:     DME Arranged:    DME Agency:     HH Arranged:  Patient Refused HH Agency:     Status of Service:  In process, will continue to follow  Medicare Important Message Given:    Date Medicare IM Given:    Medicare IM give by:    Date Additional Medicare IM Given:    Additional Medicare Important Message give by:     If discussed at [place] of Stay Meetings, dates discussed:    Additional Comments:  Dessa Phi, RN 10/07/2015, 12:51 PM

## 2015-10-08 DIAGNOSIS — S301XXA Contusion of abdominal wall, initial encounter: Secondary | ICD-10-CM | POA: Diagnosis not present

## 2015-10-08 LAB — CBC
HCT: 32.5 % — ABNORMAL LOW (ref 39.0–52.0)
Hemoglobin: 11 g/dL — ABNORMAL LOW (ref 13.0–17.0)
MCH: 32.2 pg (ref 26.0–34.0)
MCHC: 33.8 g/dL (ref 30.0–36.0)
MCV: 95 fL (ref 78.0–100.0)
PLATELETS: 157 10*3/uL (ref 150–400)
RBC: 3.42 MIL/uL — AB (ref 4.22–5.81)
RDW: 13.6 % (ref 11.5–15.5)
WBC: 7.9 10*3/uL (ref 4.0–10.5)

## 2015-10-08 MED ORDER — BISACODYL 5 MG PO TBEC: 5.0000 mg | DELAYED_RELEASE_TABLET | Freq: Every day | ORAL | Status: AC | PRN

## 2015-10-08 MED ORDER — ACETAMINOPHEN ER 650 MG PO TBCR: 650.0000 mg | EXTENDED_RELEASE_TABLET | Freq: Three times a day (TID) | ORAL | Status: AC | PRN

## 2015-10-08 NOTE — Discharge Summary (Signed)
[name], [name]                ACCOUNT NO.:  [id number]  MEDICAL RECORD NO.:  [id number]  LOCATION:  [id number]                         FACILITY:  [facility]  PHYSICIAN:  Allegra Lai. Terrence Dupont, M.D. DATE OF BIRTH:  [birth year]  DATE OF ADMISSION:  10/06/2015 DATE OF DISCHARGE:  10/08/2015                              DISCHARGE SUMMARY   ADMITTING DIAGNOSES: 1. Large left iliopsoas and left flank hematoma. 2. Coronary artery disease, history of inferior wall myocardial     infarction in the past status post percutaneous coronary     intervention to right coronary artery in the past, stable. 3. Hypertension. 4. Hyperlipidemia. 5. Gastroesophageal reflux disease. 6. History of lacunar infarct in the past. 7. Remote tobacco abuse. 8. History of CA of bladder.  FINAL DIAGNOSES: 1. Resolving large left iliopsoas and left flank hematoma. 2. Coronary artery disease, history of inferior wall myocardial     infarction in the past status post percutaneous coronary     intervention to right coronary artery in the past. 3. Hypertension. 4. Hyperlipidemia. 5. Gastroesophageal reflux disease. 6. History of lacunar infarct in the past. 7. Remote tobacco abuse. 8. History of CA of bladder.  DISCHARGE MEDICATIONS: 1. Tylenol 650 mg every 8 hours as needed for pain. 2. Dulcolax 5 mg daily as needed for constipation. 3. Atorvastatin 40 mg daily. 4. Calcium 1 tablet daily. 5. Metoprolol succinate 50 mg half-tablet twice daily as before. 6. Nasacort nasal spray as before. 7. Nitrostat 0.4 mg sublingual use as directed. 8. Omeprazole 40 mg daily. 9. Valsartan 320 mg half-tablet daily as before. 10.The patient has been advised to stop aspirin and clopidogrel.  DIET:  Low salt, low cholesterol.  The patient has been advised to call 911, if left flank pain or swelling worsens.  CONDITION AT DISCHARGE:  Stable.  FOLLOWUP:  With me in 2 weeks.  BRIEF HISTORY AND HOSPITAL COURSE:  [name] is an  82 year old male with past medical history significant for coronary artery disease, history of inferior wall MI in the past approximately 2 years ago, status post PTCA stenting to RCA, hypertension, hyperlipidemia, remote tobacco abuse, history of CA of bladder in the past, chronic kidney disease, stage 2, history of cerebrovascular disease, he came to the ER by EMS complaining of left flank pain for last 2 days.  Yesterday, pain got worse, so decided to come to ED.  The patient denies any history of trauma or fall.  Denies any urinary complaints.  Complains of occasional constipation.  Workup in the ED showed small abdominal aortic aneurysm and right iliac aneurysm and large left iliopsoas and left flank hematoma.  Patient was admitted for observation and pain management.  PHYSICAL EXAMINATION:  GENERAL:  He was alert, awake, oriented x3.  VITAL SIGNS:  Blood pressure was 86/54, pulse 67, temperature was 97.9.  HEENT:  Conjunctivae were pink.  NECK:  Supple.  No JVD.  No bruit.  LUNGS:  Clear to auscultation without rhonchi or rales.  CARDIOVASCULAR:  S1, S2 was normal.  There was soft systolic murmur.  No S3, gallop.  ABDOMEN:  Distended.  There was left flank fullness with mild tenderness and ecchymosis.  There  was no guarding.  EXTREMITIES:  There is no clubbing, cyanosis, or edema.  LABORATORY DATA:  Sodium was 136, potassium 3.8, BUN 12, creatinine 1.07.  His hemoglobin was 14.6, hematocrit 44.4, white count of 14.6. Repeat electrolytes, sodium 136, potassium 4.0, BUN 16, creatinine 1.05. Hemoglobin dropped to 11.2 yesterday.  Hematocrit 33.9.  Today, hemoglobin is 11, hematocrit 32.5, white count of 7.9, which is stable. Fecal occult blood was negative.  BRIEF HOSPITAL COURSE:  The patient was admitted to telemetry unit, was started on pain medications, received IV fluid bolus as he had CT of the abdomen with contrast.  Patient's pain was fairly well controlled with IV  morphine.  Patient has done a large area of ecchymosis in the left flank region.  His hemoglobin is stable.  His pain has markedly improved.  The patient has remained hemodynamically stable.  The patient will be discharged home on above medications.  The patient has been advised to stay off the aspirin and Plavix for now.  If he notices further swelling or pain in the left flank, should call 911 and go to ER.  Otherwise, the patient will be followed up in my office in 2 weeks and we will see treat conservatively.  Discussed with patient's wife and agrees.     Allegra Lai. Terrence Dupont, M.D.     MNH/MEDQ  D:  10/08/2015  T:  10/08/2015  Job:  [id number]

## 2015-10-08 NOTE — Discharge Instructions (Signed)
Hematoma  A hematoma is a collection of blood. The collection of blood can turn into a hard, painful lump under the skin. Your skin may turn blue or yellow if the hematoma is close to the surface of the skin. Most hematomas get better in a few days to weeks. Some hematomas are serious and need medical care. Hematomas can be very small or very big.  HOME CARE   Apply ice to the injured area:    Put ice in a plastic bag.    Place a towel between your skin and the bag.    Leave the ice on for 20 minutes, 2-3 times a day for the first 1 to 2 days.   After the first 2 days, switch to using warm packs on the injured area.   Raise (elevate) the injured area to lessen pain and puffiness (swelling). You may also wrap the area with an elastic bandage. Make sure the bandage is not wrapped too tight.   If you have a painful hematoma on your leg or foot, you may use crutches for a couple days.   Only take medicines as told by your doctor.  GET HELP RIGHT AWAY IF:    Your pain gets worse.   Your pain is not controlled with medicine.   You have a fever.   Your puffiness gets worse.   Your skin turns more blue or yellow.   Your skin over the hematoma breaks or starts bleeding.   Your hematoma is in your chest or belly (abdomen) and you are short of breath, feel weak, or have a change in consciousness.   Your hematoma is on your scalp and you have a headache that gets worse or a change in alertness or consciousness.  MAKE SURE YOU:    Understand these instructions.   Will watch your condition.   Will get help right away if you are not doing well or get worse.     This information is not intended to replace advice given to you by your health care provider. Make sure you discuss any questions you have with your health care provider.     Document Released: 09/14/2004 Document Revised: 04/09/2013 Document Reviewed: 01/15/2013  Elsevier Interactive Patient Education 2016 [employer]

## 2015-10-08 NOTE — Discharge Summary (Signed)
Discharge summary dictated on 10/08/2015 dictation number is [phone]

## 2015-10-17 ENCOUNTER — Emergency Department (HOSPITAL_COMMUNITY): Payer: Medicare Other

## 2015-10-17 ENCOUNTER — Observation Stay (HOSPITAL_COMMUNITY)
Admission: EM | Admit: 2015-10-17 | Discharge: 2015-10-19 | Disposition: A | Discharge status: Home / Self Care | Payer: Medicare Other | Attending: Internal Medicine | Admitting: Internal Medicine

## 2015-10-17 ENCOUNTER — Encounter (HOSPITAL_COMMUNITY): Payer: Self-pay | Admitting: Emergency Medicine

## 2015-10-17 DIAGNOSIS — T148XXA Other injury of unspecified body region, initial encounter: Secondary | ICD-10-CM

## 2015-10-17 DIAGNOSIS — G9340 Encephalopathy, unspecified: Secondary | ICD-10-CM | POA: Diagnosis not present

## 2015-10-17 DIAGNOSIS — Z8673 Personal history of transient ischemic attack (TIA), and cerebral infarction without residual deficits: Secondary | ICD-10-CM | POA: Insufficient documentation

## 2015-10-17 DIAGNOSIS — Z87891 Personal history of nicotine dependence: Secondary | ICD-10-CM | POA: Diagnosis not present

## 2015-10-17 DIAGNOSIS — E785 Hyperlipidemia, unspecified: Secondary | ICD-10-CM | POA: Insufficient documentation

## 2015-10-17 DIAGNOSIS — I129 Hypertensive chronic kidney disease with stage 1 through stage 4 chronic kidney disease, or unspecified chronic kidney disease: Secondary | ICD-10-CM | POA: Diagnosis not present

## 2015-10-17 DIAGNOSIS — R4182 Altered mental status, unspecified: Secondary | ICD-10-CM | POA: Diagnosis present

## 2015-10-17 DIAGNOSIS — I2510 Atherosclerotic heart disease of native coronary artery without angina pectoris: Secondary | ICD-10-CM | POA: Insufficient documentation

## 2015-10-17 DIAGNOSIS — N182 Chronic kidney disease, stage 2 (mild): Secondary | ICD-10-CM | POA: Diagnosis not present

## 2015-10-17 DIAGNOSIS — R1032 Left lower quadrant pain: Secondary | ICD-10-CM | POA: Diagnosis not present

## 2015-10-17 DIAGNOSIS — Z79899 Other long term (current) drug therapy: Secondary | ICD-10-CM | POA: Insufficient documentation

## 2015-10-17 DIAGNOSIS — B999 Unspecified infectious disease: Secondary | ICD-10-CM

## 2015-10-17 DIAGNOSIS — N390 Urinary tract infection, site not specified: Secondary | ICD-10-CM | POA: Diagnosis not present

## 2015-10-17 DIAGNOSIS — G459 Transient cerebral ischemic attack, unspecified: Principal | ICD-10-CM | POA: Insufficient documentation

## 2015-10-17 DIAGNOSIS — I252 Old myocardial infarction: Secondary | ICD-10-CM | POA: Diagnosis not present

## 2015-10-17 DIAGNOSIS — I861 Scrotal varices: Secondary | ICD-10-CM | POA: Insufficient documentation

## 2015-10-17 DIAGNOSIS — R471 Dysarthria and anarthria: Secondary | ICD-10-CM | POA: Diagnosis not present

## 2015-10-17 DIAGNOSIS — Z955 Presence of coronary angioplasty implant and graft: Secondary | ICD-10-CM | POA: Insufficient documentation

## 2015-10-17 DIAGNOSIS — Z7951 Long term (current) use of inhaled steroids: Secondary | ICD-10-CM | POA: Insufficient documentation

## 2015-10-17 DIAGNOSIS — Z8551 Personal history of malignant neoplasm of bladder: Secondary | ICD-10-CM | POA: Insufficient documentation

## 2015-10-17 DIAGNOSIS — R2990 Unspecified symptoms and signs involving the nervous system: Secondary | ICD-10-CM | POA: Insufficient documentation

## 2015-10-17 LAB — CBC
HCT: 38.8 % — ABNORMAL LOW (ref 39.0–52.0)
HEMOGLOBIN: 12.7 g/dL — AB (ref 13.0–17.0)
MCH: 31.3 pg (ref 26.0–34.0)
MCHC: 32.7 g/dL (ref 30.0–36.0)
MCV: 95.6 fL (ref 78.0–100.0)
Platelets: 253 10*3/uL (ref 150–400)
RBC: 4.06 MIL/uL — AB (ref 4.22–5.81)
RDW: 14.3 % (ref 11.5–15.5)
WBC: 8.4 10*3/uL (ref 4.0–10.5)

## 2015-10-17 LAB — COMPREHENSIVE METABOLIC PANEL
ALBUMIN: 3.3 g/dL — AB (ref 3.5–5.0)
ALK PHOS: 57 U/L (ref 38–126)
ALT: 12 U/L — AB (ref 17–63)
ANION GAP: 12 (ref 5–15)
AST: 20 U/L (ref 15–41)
BILIRUBIN TOTAL: 1.1 mg/dL (ref 0.3–1.2)
BUN: 10 mg/dL (ref 6–20)
CALCIUM: 9.1 mg/dL (ref 8.9–10.3)
CO2: 24 mmol/L (ref 22–32)
CREATININE: 1.08 mg/dL (ref 0.61–1.24)
Chloride: 102 mmol/L (ref 101–111)
GFR calc Af Amer: >60 mL/min (ref >60)
GFR calc non Af Amer: 59 mL/min — ABNORMAL LOW (ref >60)
GLUCOSE: 127 mg/dL — AB (ref 65–99)
Potassium: 3.9 mmol/L (ref 3.5–5.1)
Sodium: 138 mmol/L (ref 135–145)
TOTAL PROTEIN: 6.1 g/dL — AB (ref 6.5–8.1)

## 2015-10-17 LAB — URINE MICROSCOPIC-ADD ON

## 2015-10-17 LAB — I-STAT CHEM 8, ED
BUN: 12 mg/dL (ref 6–20)
CALCIUM ION: 1.13 mmol/L (ref 1.13–1.30)
CHLORIDE: 98 mmol/L — AB (ref 101–111)
CREATININE: 1 mg/dL (ref 0.61–1.24)
GLUCOSE: 127 mg/dL — AB (ref 65–99)
HCT: 39 % (ref 39.0–52.0)
Hemoglobin: 13.3 g/dL (ref 13.0–17.0)
POTASSIUM: 3.6 mmol/L (ref 3.5–5.1)
Sodium: 137 mmol/L (ref 135–145)
TCO2: 25 mmol/L (ref 0–100)

## 2015-10-17 LAB — DIFFERENTIAL
Basophils Absolute: 0 10*3/uL (ref 0.0–0.1)
Basophils Relative: 0 %
EOS PCT: 3 %
Eosinophils Absolute: 0.3 10*3/uL (ref 0.0–0.7)
LYMPHS ABS: 2.7 10*3/uL (ref 0.7–4.0)
LYMPHS PCT: 33 %
Monocytes Absolute: 0.5 10*3/uL (ref 0.1–1.0)
Monocytes Relative: 6 %
NEUTROS ABS: 4.9 10*3/uL (ref 1.7–7.7)
NEUTROS PCT: 58 %

## 2015-10-17 LAB — URINALYSIS, ROUTINE W REFLEX MICROSCOPIC
GLUCOSE, UA: NEGATIVE mg/dL
Hgb urine dipstick: NEGATIVE
Ketones, ur: NEGATIVE mg/dL
NITRITE: NEGATIVE
PH: 6 (ref 5.0–8.0)
Protein, ur: NEGATIVE mg/dL
SPECIFIC GRAVITY, URINE: 1.025 (ref 1.005–1.030)

## 2015-10-17 LAB — APTT: aPTT: 28 seconds (ref 24–37)

## 2015-10-17 LAB — PROTIME-INR
INR: 0.98 (ref 0.00–1.49)
Prothrombin Time: 13.2 seconds (ref 11.6–15.2)

## 2015-10-17 LAB — I-STAT CG4 LACTIC ACID, ED: Lactic Acid, Venous: 1.5 mmol/L (ref 0.5–2.0)

## 2015-10-17 LAB — I-STAT TROPONIN, ED
TROPONIN I, POC: 0 ng/mL (ref 0.00–0.08)
Troponin i, poc: 0 ng/mL (ref 0.00–0.08)

## 2015-10-17 LAB — CBG MONITORING, ED: Glucose-Capillary: 131 mg/dL — ABNORMAL HIGH (ref 65–99)

## 2015-10-17 MED ORDER — VANCOMYCIN HCL IN DEXTROSE 1-5 GM/200ML-% IV SOLN
1000.0000 mg | Freq: Once | INTRAVENOUS | Status: DC
  Administered 2015-10-18: 1000 mg via INTRAVENOUS
  Filled 2015-10-17: qty 200

## 2015-10-17 MED ORDER — PIPERACILLIN-TAZOBACTAM 3.375 G IVPB 30 MIN
3.3750 g | Freq: Once | INTRAVENOUS | Status: AC
  Administered 2015-10-18: 3.375 g via INTRAVENOUS
  Filled 2015-10-17: qty 50

## 2015-10-17 MED ORDER — HYDROMORPHONE HCL 1 MG/ML IJ SOLN: 0.2500 mg | Freq: Once | INTRAMUSCULAR | Status: DC

## 2015-10-17 MED ORDER — SODIUM CHLORIDE 0.9 % IV BOLUS (SEPSIS)
1000.0000 mL | INTRAVENOUS | Status: AC
  Administered 2015-10-18: 1000 mL via INTRAVENOUS

## 2015-10-17 MED ORDER — IOHEXOL 300 MG/ML  SOLN
100.0000 mL | Freq: Once | INTRAMUSCULAR | Status: AC | PRN
  Administered 2015-10-18: 100 mL via INTRAVENOUS

## 2015-10-17 MED ORDER — LORAZEPAM 2 MG/ML IJ SOLN
INTRAMUSCULAR | Status: AC
  Filled 2015-10-17: qty 1

## 2015-10-17 NOTE — ED Notes (Signed)
Patient transported to MRI 

## 2015-10-17 NOTE — Code Documentation (Signed)
[name] is a 82yo wm presenting to [facility] via [facility] for sudden onset confusion and garbled speech.  Per his wife he was d/c'd from [facility] today after developing a flank hematoma while on plavix.  He is independently mobile at baseline.  NIH 24.  On assessment he is grossly weak and will not consistently f/c.  Speech is garbled, though he will intermittently say something appropriate that is clear and understandable.  He is afebrile.  Urine and labs obtained.

## 2015-10-17 NOTE — ED Notes (Signed)
See recently for a hematoma to L flank, for which he stopped taking his plavix. At 2100 today, pt found to have sudden onset of R facial droop, confusion, and difficulty moving.

## 2015-10-17 NOTE — ED Provider Notes (Signed)
CSN: [id number]     Arrival date & time 10/17/15  2211 History   First MD Initiated Contact with Patient 10/17/15 2215     Chief Complaint  Patient presents with  . Code Stroke    An emergency department physician performed an initial assessment on this suspected stroke patient at 2212. (Consider location/radiation/quality/duration/timing/severity/associated sxs/prior Treatment) HPI Comments: 82 year old male with an extensive past medical history including prior MI status post stenting who was recently admitted for nontraumatic psoas hematoma presents to the ED with sudden onset of altered mental status and right facial droop. Last known normal 2100. Patient included as a code stroke. On arrival ABC's intact and cleared for CT scanner. Neurology at bedside to perform their assessment.  Patient is a 82 y.o. male presenting with Acute Neurological Problem.  Cerebrovascular Accident This is a new problem. The current episode started today. The problem occurs constantly. The problem has been unchanged. Associated symptoms include abdominal pain. Pertinent negatives include no congestion, coughing, numbness or vomiting. Nothing aggravates the symptoms. He has tried nothing for the symptoms.    History reviewed. No pertinent past medical history. Past Surgical History  Procedure Laterality Date  . Tonsillectomy    . Colonoscopy  2007    Diverticulosis  . Transurethral resection of bladder tumor  07/27/2011    Procedure: TRANSURETHRAL RESECTION OF BLADDER TUMOR (TURBT);  Surgeon: Malka So;  Location: [facility];  Service: Urology;  Laterality: N/A;  Cystoscopy/Transurethral Resection of Bladder Tumor  . Ercp  02/01/2012    Procedure: ENDOSCOPIC RETROGRADE CHOLANGIOPANCREATOGRAPHY (ERCP);  Surgeon: Beryle Beams, MD;  Location: [facility];  Service: Endoscopy;  Laterality: N/A;  . Cataract extraction w/ intraocular lens  implant, bilateral Bilateral   . Laparoscopic cholecystectomy  06-27-2007     Dr Johnathan Hausen  . Cystoscopy w/ retrogrades Bilateral 12/26/2012    Procedure: CYSTOSCOPY WITH Bilateral RETROGRADE PYELOGRAM;  Surgeon: Malka So, MD;  Location: [facility];  Service: Urology;  Laterality: Bilateral;  BLADDER BIOPSY  . Fulguration of bladder tumor N/A 12/26/2012    Procedure: Multiple Bladder Biopsy;  Surgeon: Malka So, MD;  [facility]: [facility];  Service: Urology;  Laterality: N/A;  . Coronary angioplasty with stent placement  11/06/2013    "1"  . Esophagogastroduodenoscopy (egd) with propofol N/A 01/29/2014    Procedure: ESOPHAGOGASTRODUODENOSCOPY (EGD) WITH PROPOFOL;  Surgeon: Inda Castle, MD;  Location: [facility];  Service: Endoscopy;  Laterality: N/A;  . Left heart catheterization with coronary angiogram N/A 11/06/2013    Procedure: LEFT HEART CATHETERIZATION WITH CORONARY ANGIOGRAM;  Surgeon: Clent Demark, MD;  Location: [facility];  Service: Cardiovascular;  Laterality: N/A;   Family History  Problem Relation Age of Onset  . Heart attack Father 1  . Colon cancer Mother 72  . Hypertension Mother   . Breast cancer Maternal Grandmother   . Heart attack Maternal Grandfather     late 76s  . Heart failure Sister   . Diabetes Neg Hx    Social History  Substance Use Topics  . Smoking status: Former Smoker -- 1.00 packs/day for 31 years    Types: Cigarettes    Quit date: 08/21/1973  . Smokeless tobacco: Never Used     Comment: smoked  1944-1975 , up to 1 ppd  . Alcohol Use: 1.2 oz/week    2 Glasses of wine per week     Comment: 11/06/2013 "glass of wine maybe twice/wk"    Review of  Systems  Unable to perform ROS: Mental status change  HENT: Negative for congestion.   Respiratory: Negative for cough.   Gastrointestinal: Positive for abdominal pain. Negative for vomiting.  Neurological: Negative for numbness.      Allergies  Cephalexin  Home Medications   Prior to Admission medications   Medication Sig  Start Date End Date Taking? Authorizing Provider  acetaminophen (TYLENOL 8 HOUR) 650 MG CR tablet Take 1 tablet (650 mg total) by mouth every 8 (eight) hours as needed for pain. 10/08/15  Yes Charolette Forward, MD  atorvastatin (LIPITOR) 40 MG tablet Take 40 mg by mouth daily. 08/20/15  Yes Historical Provider, MD  bisacodyl (DULCOLAX) 5 MG EC tablet Take 1 tablet (5 mg total) by mouth daily as needed for moderate constipation. 10/08/15  Yes Charolette Forward, MD  CALCIUM PO Take by mouth every evening.   Yes Historical Provider, MD  metoprolol succinate (TOPROL-XL) 50 MG 24 hr tablet Take 25 mg by mouth 2 (two) times daily. Take with or immediately following a meal. 12/19/13  Yes Hendricks Limes, MD  nitroGLYCERIN (NITROSTAT) 0.4 MG SL tablet Place 1 tablet (0.4 mg total) under the tongue every 5 (five) minutes x 3 doses as needed for chest pain. 11/08/13  Yes Charolette Forward, MD  omeprazole (PRILOSEC) 40 MG capsule TAKE ONE CAPSULE BY MOUTH EVERY DAY Patient taking differently: Take 1 capsule (40 mg) by mouth every day. 02/12/15  Yes Amy S Esterwood, PA-C  triamcinolone (NASACORT ALLERGY 24HR) 55 MCG/ACT AERO nasal inhaler Place 2 sprays into the nose daily.   Yes Historical Provider, MD  valsartan (DIOVAN) 320 MG tablet Take 0.5 tablets (160 mg total) by mouth daily. --- Needs office visit for further refills. Patient taking differently: Take 160 mg by mouth every evening. --- Needs office visit for further refills. 06/18/15  Yes Hendricks Limes, MD   BP 171/84 mmHg  Pulse 87  Temp(Src) 97.5 F (36.4 C) (Oral)  Resp 12  Ht 5\' 9"  (1.753 m)  Wt 88.451 kg  BMI 28.78 kg/m2  SpO2 97% Physical Exam  Constitutional: He is oriented to person, place, and time. He appears well-nourished. No distress.  HENT:  Head: Normocephalic and atraumatic.  Right Ear: External ear normal.  Left Ear: External ear normal.  Eyes: Pupils are equal, round, and reactive to light. Right eye exhibits no discharge. Left eye  exhibits no discharge. No scleral icterus.  Neck: Normal range of motion. Neck supple.  Cardiovascular: Normal rate.  Exam reveals no gallop and no friction rub.   No murmur heard. Pulmonary/Chest: Effort normal and breath sounds normal. No stridor. No respiratory distress. He has no wheezes. He has no rales. He exhibits no tenderness.  Abdominal: Soft. He exhibits no distension and no mass. There is tenderness. There is guarding. There is no rebound.  Musculoskeletal: He exhibits no edema or tenderness.  Neurological: He is alert and oriented to person, place, and time. GCS eye subscore is 4. GCS verbal subscore is 3. GCS motor subscore is 4.  Right facial droop.   Skin: Skin is warm and dry. Ecchymosis (over the left flank down into the left groin. Skip area in the growing and ecchymosis present again in the left scrotum) noted. No rash noted. He is not diaphoretic. No erythema.    ED Course  Procedures (including critical care time) Labs Review Labs Reviewed  CBC - Abnormal; Notable for the following:    RBC 4.06 (*)    Hemoglobin 12.7 (*)  HCT 38.8 (*)    All other components within normal limits  CBG MONITORING, ED - Abnormal; Notable for the following:    Glucose-Capillary 131 (*)    All other components within normal limits  I-STAT CHEM 8, ED - Abnormal; Notable for the following:    Chloride 98 (*)    Glucose, Bld 127 (*)    All other components within normal limits  PROTIME-INR  APTT  DIFFERENTIAL  COMPREHENSIVE METABOLIC PANEL  URINALYSIS, ROUTINE W REFLEX MICROSCOPIC (NOT AT [facility])  I-STAT TROPOININ, ED  I-STAT CG4 LACTIC ACID, ED    Imaging Review Ct Head Wo Contrast  10/17/2015  CLINICAL DATA:  Altered mental status, last seen normal at 2100 hours. History of hypertension, hyperlipidemia. EXAM: CT HEAD WITHOUT CONTRAST TECHNIQUE: Contiguous axial images were obtained from the base of the skull through the vertex without intravenous contrast. COMPARISON:  MRI head  September 13, 2015 FINDINGS: Moderately motion degraded examination. The ventricles and sulci are normal for age. No intraparenchymal hemorrhage, mass effect nor midline shift. Patchy supratentorial white matter hypodensities are within normal range for patient's age and though non-specific suggest sequelae of chronic small vessel ischemic disease. No acute large vascular territory infarcts. RIGHT posterior frontal encephalomalacia. Old bilateral basal ganglia lacunar infarcts. No abnormal extra-axial fluid collections. Probable small RIGHT parafalcine arachnoid cyst. Basal cisterns are patent. Moderate calcific atherosclerosis of the carotid siphons. No skull fracture. The included ocular globes and orbital contents are non-suspicious. Status post bilateral ocular lens implants. LEFT maxillary mucosal retention cyst and mild paranasal sinus mucosal thickening. IMPRESSION: No acute intracranial process on this moderately motion degraded examination. Stable chronic changes including old RIGHT posterior frontal lobe infarct, basal ganglia lacunar infarcts and moderate chronic small vessel ischemic disease. Acute findings discussed with and reconfirmed by Dr.Stewart, neurology on 10/17/2015 at 10:32 pm. Electronically Signed   By: Elon Alas M.D.   On: 10/17/2015 22:34   I have personally reviewed and evaluated these images and lab results as part of my medical decision-making.   EKG Interpretation None      MDM   Code stroke initiated on arrival. CT head unremarkable neurology request an MRI. On our assessment we noted extensive ecchymosis of the abdominal wall communicating with the scrotum. Given his abdominal tenderness and altered mental status we started a concomitant code sepsis on him as well. To obtain CT abdomen to assess for any intra-abdominal etiology.  MRI without acute stroke. EKG without acute ischemia. Troponin negative. Urine without infection. Chest x-ray without pneumonia. No  electrolyte derangements. Ammonia ordered and currently pending. Etiology of patient's altered mental status unknown at this time he'll require admission for further evaluation. I spoke with Dr. Terrence Dupont, the patient's primary care provider, who agreed and requested that the hospitalist service be contacted for admission.  Diagnostic studies interpreted by me and use to my clinical decision-making. Patient was seen in conjunction with Dr. Tyrone Nine   Final diagnoses:  Altered mental status  Acute encephalopathy        Addison Lank, MD 10/18/15 O'[place], DO 10/18/15 1656

## 2015-10-17 NOTE — Consult Note (Signed)
Admission H&P    Chief Complaint: Altered mental status.  HPI: [name] is an 82 y.o. male with a history of coronary artery disease, hypertension, hyperlipidemia, stroke and cancer Wyvonnia Lora brought to the emergency room and code stroke status following an acute change in mental status with confusion and to lesser extent agitation. No clear focal deficit was noted except for equivocal facial droop. Patient's speech output was unintelligible for the most part. CT scan of his head was obtained which showed no acute intracranial abnormality. MRI of the brain showed no signs of an acute intracranial abnormality, as well. He was afebrile. Urine was cloudy with rare bacteria on microscopic exam. Blood sugar was 131. Electrolytes were unremarkable. He was started on empiric antibiotic treatment with so some and vancomycin for possible sepsis. He had no focal deficits on neurologic exam. He was recently hospitalized for large left iliopsoas and left flank hematoma. He had been on Plavix, which was discontinued.  History reviewed. No pertinent past medical history.  Past Surgical History  Procedure Laterality Date  . Tonsillectomy    . Colonoscopy  2007    Diverticulosis  . Transurethral resection of bladder tumor  07/27/2011    Procedure: TRANSURETHRAL RESECTION OF BLADDER TUMOR (TURBT);  Surgeon: Malka So;  Location: [facility];  Service: Urology;  Laterality: N/A;  Cystoscopy/Transurethral Resection of Bladder Tumor  . Ercp  02/01/2012    Procedure: ENDOSCOPIC RETROGRADE CHOLANGIOPANCREATOGRAPHY (ERCP);  Surgeon: Beryle Beams, MD;  Location: [facility];  Service: Endoscopy;  Laterality: N/A;  . Cataract extraction w/ intraocular lens  implant, bilateral Bilateral   . Laparoscopic cholecystectomy  06-27-2007    Dr Johnathan Hausen  . Cystoscopy w/ retrogrades Bilateral 12/26/2012    Procedure: CYSTOSCOPY WITH Bilateral RETROGRADE PYELOGRAM;  Surgeon: Malka So, MD;  Location: [facility];  Service: Urology;  Laterality: Bilateral;  BLADDER BIOPSY  . Fulguration of bladder tumor N/A 12/26/2012    Procedure: Multiple Bladder Biopsy;  Surgeon: Malka So, MD;  Location: [facility];  Service: Urology;  Laterality: N/A;  . Coronary angioplasty with stent placement  11/06/2013    "1"  . Esophagogastroduodenoscopy (egd) with propofol N/A 01/29/2014    Procedure: ESOPHAGOGASTRODUODENOSCOPY (EGD) WITH PROPOFOL;  Surgeon: Inda Castle, MD;  Location: [facility];  Service: Endoscopy;  Laterality: N/A;  . Left heart catheterization with coronary angiogram N/A 11/06/2013    Procedure: LEFT HEART CATHETERIZATION WITH CORONARY ANGIOGRAM;  Surgeon: Clent Demark, MD;  Location: [facility] C[facility];  Service: Cardiovascular;  Laterality: N/A;    Family History  Problem Relation Age of Onset  . Heart attack Father 73  . Colon cancer Mother 76  . Hypertension Mother   . Breast cancer Maternal Grandmother   . Heart attack Maternal Grandfather     late 59s  . Heart failure Sister   . Diabetes Neg Hx    Social History:  reports that he quit smoking about 42 years ago. His smoking use included Cigarettes. He has a 31 pack-year smoking history. He has never used smokeless tobacco. He reports that he drinks about 1.2 oz of alcohol per week. He reports that he does not use illicit drugs.  Allergies:  Allergies  Allergen Reactions  . Cephalexin Other (See Comments)    Bloody loose stools    Medications: Patient's preadmission medications were reviewed by me.  ROS: History obtained from spouse  General ROS: negative for - chills, fatigue, fever, night sweats,  weight gain or weight loss Psychological ROS: Altered mental status as noted in present illness Ophthalmic ROS: negative for - blurry vision, double vision, eye pain or loss of vision ENT ROS: negative for - epistaxis, nasal discharge, oral lesions, sore throat, tinnitus or vertigo Allergy and Immunology ROS:  negative for - hives or itchy/watery eyes Hematological and Lymphatic ROS: As noted in present illness Endocrine ROS: negative for - galactorrhea, hair pattern changes, polydipsia/polyuria or temperature intolerance Respiratory ROS: negative for - cough, hemoptysis, shortness of breath or wheezing Cardiovascular ROS: negative for - chest pain, dyspnea on exertion, edema or irregular heartbeat Gastrointestinal ROS: negative for - abdominal pain, diarrhea, hematemesis, nausea/vomiting or stool incontinence Genito-Urinary ROS: negative for - dysuria, hematuria, incontinence or urinary frequency/urgency Musculoskeletal ROS: negative for - joint swelling or muscular weakness Neurological ROS: as noted in HPI Dermatological ROS: negative for rash and skin lesion changes  Physical Examination: Blood pressure 148/74, pulse 87, temperature 97.5 F (36.4 C), temperature source Oral, resp. rate 13, height _0  (1.753 m), weight 88.451 kg (195 lb), SpO2 99 %.  HEENT-  Normocephalic, no lesions, without obvious abnormality.  Normal external eye and conjunctiva.  Normal TM's bilaterally.  Normal auditory canals and external ears. Normal external nose, mucus membranes and septum.  Normal pharynx. Neck supple with no masses, nodes, nodules or enlargement. Cardiovascular - regular rate and rhythm, S1, S2 normal, no murmur, click, rub or gallop Lungs - chest clear, no wheezing, rales, normal symmetric air entry Abdomen - soft, non-tender; bowel sounds normal; no masses,  no organomegaly Extremities - no joint deformities, effusion, or inflammation and no edema  Neurologic Examination: Mental Status: Lethargic, confused and agitated.  Speech  was unintelligible. He did not follow any simple commandsranial Nerves: II-patient was not cooperative enough to test visual fields. III/IV/VI-Pupils were equal and reacted normally to light . Extraocular movements were full and conjugate on right left lateral gaze .     VII- no facial weaknessnoted . VIII-normal. X- slightly dysarthric and unintelligible. Motor: patient moved extremities equally but appeared to be mildly weak diffusely  Sensory: Unable to adequately assess. Deep Tendon Reflexes: 1+ and  symmetric. Plantars: Mute bilaterally Cerebellar: Normal finger-to-nose testing.  Results for orders placed or performed during the hospital encounter of 10/17/15 (from the past 48 hour(s))  Protime-INR     Status: None   Collection Time: 10/17/15 10:10 PM  Result Value Ref Range   Prothrombin Time 13.2 11.6 - 15.2 seconds   INR 0.98 0.00 - 1.49  APTT     Status: None   Collection Time: 10/17/15 10:10 PM  Result Value Ref Range   aPTT 28 24 - 37 seconds  CBC     Status: Abnormal   Collection Time: 10/17/15 10:10 PM  Result Value Ref Range   WBC 8.4 4.0 - 10.5 K/uL   RBC 4.06 (L) 4.22 - 5.81 MIL/uL   Hemoglobin 12.7 (L) 13.0 - 17.0 g/dL   HCT 38.8 (L) 39.0 - 52.0 %   MCV 95.6 78.0 - 100.0 fL   MCH 31.3 26.0 - 34.0 pg   MCHC 32.7 30.0 - 36.0 g/dL   RDW 14.3 11.5 - 15.5 %   Platelets 253 150 - 400 K/uL  Differential     Status: None   Collection Time: 10/17/15 10:10 PM  Result Value Ref Range   Neutrophils Relative % 58 %   Neutro Abs 4.9 1.7 - 7.7 K/uL   Lymphocytes Relative 33 %  Lymphs Abs 2.7 0.7 - 4.0 K/uL   Monocytes Relative 6 %   Monocytes Absolute 0.5 0.1 - 1.0 K/uL   Eosinophils Relative 3 %   Eosinophils Absolute 0.3 0.0 - 0.7 K/uL   Basophils Relative 0 %   Basophils Absolute 0.0 0.0 - 0.1 K/uL  Comprehensive metabolic panel     Status: Abnormal   Collection Time: 10/17/15 10:10 PM  Result Value Ref Range   Sodium 138 135 - 145 mmol/L   Potassium 3.9 3.5 - 5.1 mmol/L   Chloride 102 101 - 111 mmol/L   CO2 24 22 - 32 mmol/L   Glucose, Bld 127 (H) 65 - 99 mg/dL   BUN 10 6 - 20 mg/dL   Creatinine, Ser 1.08 0.61 - 1.24 mg/dL   Calcium 9.1 8.9 - 10.3 mg/dL   Total Protein 6.1 (L) 6.5 - 8.1 g/dL   Albumin 3.3 (L) 3.5 - 5.0  g/dL   AST 20 15 - 41 U/L   ALT 12 (L) 17 - 63 U/L   Alkaline Phosphatase 57 38 - 126 U/L   Total Bilirubin 1.1 0.3 - 1.2 mg/dL   GFR calc non Af Amer 59 (L) >60 mL/min   GFR calc Af Amer >60 >60 mL/min    Comment: (NOTE) The eGFR has been calculated using the CKD EPI equation. This calculation has not been validated in all clinical situations. eGFR's persistently <60 mL/min signify possible Chronic Kidney Disease.    Anion gap 12 5 - 15  I-stat troponin, ED (not at [facility], [facility])     Status: None   Collection Time: 10/17/15 10:23 PM  Result Value Ref Range   Troponin i, poc 0.00 0.00 - 0.08 ng/mL   Comment 3            Comment: Due to the release kinetics of cTnI, a negative result within the first hours of the onset of symptoms does not rule out myocardial infarction with certainty. If myocardial infarction is still suspected, repeat the test at appropriate intervals.   I-Stat Chem 8, ED  (not at [facility], [facility])     Status: Abnormal   Collection Time: 10/17/15 10:26 PM  Result Value Ref Range   Sodium 137 135 - 145 mmol/L   Potassium 3.6 3.5 - 5.1 mmol/L   Chloride 98 (L) 101 - 111 mmol/L   BUN 12 6 - 20 mg/dL   Creatinine, Ser 1.00 0.61 - 1.24 mg/dL   Glucose, Bld 127 (H) 65 - 99 mg/dL   Calcium, Ion 1.13 1.13 - 1.30 mmol/L   TCO2 25 0 - 100 mmol/L   Hemoglobin 13.3 13.0 - 17.0 g/dL   HCT 39.0 39.0 - 52.0 %  CBG monitoring, ED     Status: Abnormal   Collection Time: 10/17/15 10:34 PM  Result Value Ref Range   Glucose-Capillary 131 (H) 65 - 99 mg/dL  I-Stat CG4 Lactic Acid, ED     Status: None   Collection Time: 10/17/15 11:10 PM  Result Value Ref Range   Lactic Acid, Venous 1.50 0.5 - 2.0 mmol/L  Urinalysis, Routine w reflex microscopic (not at [facility])     Status: Abnormal   Collection Time: 10/17/15 11:11 PM  Result Value Ref Range   Color, Urine AMBER (A) YELLOW    Comment: BIOCHEMICALS MAY BE AFFECTED BY COLOR   APPearance CLOUDY (A) CLEAR   Specific Gravity, Urine  1.025 1.005 - 1.030   pH 6.0 5.0 - 8.0   Glucose, UA  NEGATIVE NEGATIVE mg/dL   Hgb urine dipstick NEGATIVE NEGATIVE   Bilirubin Urine SMALL (A) NEGATIVE   Ketones, ur NEGATIVE NEGATIVE mg/dL   Protein, ur NEGATIVE NEGATIVE mg/dL   Nitrite NEGATIVE NEGATIVE   Leukocytes, UA TRACE (A) NEGATIVE  Urine microscopic-add on     Status: Abnormal   Collection Time: 10/17/15 11:11 PM  Result Value Ref Range   Squamous Epithelial / LPF 0-5 (A) NONE SEEN   WBC, UA 6-30 0 - 5 WBC/hpf   RBC / HPF 0-5 0 - 5 RBC/hpf   Bacteria, UA RARE (A) NONE SEEN   Urine-Other MUCOUS PRESENT   I-stat troponin, ED (not at [facility], [facility])     Status: None   Collection Time: 10/17/15 11:16 PM  Result Value Ref Range   Troponin i, poc 0.00 0.00 - 0.08 ng/mL   Comment 3            Comment: Due to the release kinetics of cTnI, a negative result within the first hours of the onset of symptoms does not rule out myocardial infarction with certainty. If myocardial infarction is still suspected, repeat the test at appropriate intervals.    Ct Head Wo Contrast  10/17/2015  CLINICAL DATA:  Altered mental status, last seen normal at 2100 hours. History of hypertension, hyperlipidemia. EXAM: CT HEAD WITHOUT CONTRAST TECHNIQUE: Contiguous axial images were obtained from the base of the skull through the vertex without intravenous contrast. COMPARISON:  MRI head September 13, 2015 FINDINGS: Moderately motion degraded examination. The ventricles and sulci are normal for age. No intraparenchymal hemorrhage, mass effect nor midline shift. Patchy supratentorial white matter hypodensities are within normal range for patient's age and though non-specific suggest sequelae of chronic small vessel ischemic disease. No acute large vascular territory infarcts. RIGHT posterior frontal encephalomalacia. Old bilateral basal ganglia lacunar infarcts. No abnormal extra-axial fluid collections. Probable small RIGHT parafalcine arachnoid cyst. Basal  cisterns are patent. Moderate calcific atherosclerosis of the carotid siphons. No skull fracture. The included ocular globes and orbital contents are non-suspicious. Status post bilateral ocular lens implants. LEFT maxillary mucosal retention cyst and mild paranasal sinus mucosal thickening. IMPRESSION: No acute intracranial process on this moderately motion degraded examination. Stable chronic changes including old RIGHT posterior frontal lobe infarct, basal ganglia lacunar infarcts and moderate chronic small vessel ischemic disease. Acute findings discussed with and reconfirmed by Dr.Aleana Fifita, neurology on 10/17/2015 at 10:32 pm. Electronically Signed   By: Elon Alas M.D.   On: 10/17/2015 22:34    Assessment/Plan  82 year old man with coronary artery disease, hypertension and hyperlipidemia is entering with altered mental status with no focal neurologic deficit acutely and no abnormality demonstrated on imaging study of his brain. Stroke or TIA is unlikely. Infectious process or acute metabolic abnormality is suspected. Seizure was postictal state is unlikely but has to be considered.  Recommendations: 1. EEG, routine about study 2. Continued broad-spectrum antibiotic coverage as planned 3. Consider LP if patient becomes febrile  We will continue to follow this patient with you.    C.R. Nicole Kindred, MD [facility] [phone]   10/18/2015, 12:01 AM

## 2015-10-18 ENCOUNTER — Observation Stay (HOSPITAL_COMMUNITY): Payer: Medicare Other

## 2015-10-18 ENCOUNTER — Observation Stay (HOSPITAL_BASED_OUTPATIENT_CLINIC_OR_DEPARTMENT_OTHER)
Admit: 2015-10-18 | Discharge: 2015-10-18 | Disposition: A | Discharge status: Home / Self Care | Payer: Medicare Other | Attending: Internal Medicine | Admitting: Internal Medicine

## 2015-10-18 ENCOUNTER — Encounter (HOSPITAL_COMMUNITY): Payer: Self-pay | Admitting: Radiology

## 2015-10-18 DIAGNOSIS — R2990 Unspecified symptoms and signs involving the nervous system: Secondary | ICD-10-CM

## 2015-10-18 DIAGNOSIS — R4182 Altered mental status, unspecified: Secondary | ICD-10-CM | POA: Diagnosis present

## 2015-10-18 DIAGNOSIS — G459 Transient cerebral ischemic attack, unspecified: Secondary | ICD-10-CM | POA: Diagnosis not present

## 2015-10-18 DIAGNOSIS — R410 Disorientation, unspecified: Secondary | ICD-10-CM

## 2015-10-18 DIAGNOSIS — F05 Delirium due to known physiological condition: Secondary | ICD-10-CM | POA: Insufficient documentation

## 2015-10-18 LAB — AMMONIA: AMMONIA: 15 umol/L (ref 9–35)

## 2015-10-18 LAB — I-STAT CG4 LACTIC ACID, ED: Lactic Acid, Venous: 1.06 mmol/L (ref 0.5–2.0)

## 2015-10-18 MED ORDER — ATORVASTATIN CALCIUM 40 MG PO TABS
40.0000 mg | ORAL_TABLET | Freq: Every day | ORAL | Status: DC
  Administered 2015-10-18: 40 mg via ORAL
  Filled 2015-10-18: qty 1

## 2015-10-18 MED ORDER — ACETAMINOPHEN 325 MG PO TABS: 650.0000 mg | ORAL_TABLET | Freq: Three times a day (TID) | ORAL | Status: DC | PRN

## 2015-10-18 MED ORDER — NITROGLYCERIN 0.4 MG SL SUBL: 0.4000 mg | SUBLINGUAL_TABLET | SUBLINGUAL | Status: DC | PRN

## 2015-10-18 MED ORDER — ACETAMINOPHEN ER 650 MG PO TBCR: 650.0000 mg | EXTENDED_RELEASE_TABLET | Freq: Three times a day (TID) | ORAL | Status: DC | PRN

## 2015-10-18 MED ORDER — PANTOPRAZOLE SODIUM 40 MG PO TBEC
80.0000 mg | DELAYED_RELEASE_TABLET | Freq: Every day | ORAL | Status: DC
  Administered 2015-10-18 – 2015-10-19 (×2): 80 mg via ORAL
  Filled 2015-10-18 (×2): qty 2

## 2015-10-18 MED ORDER — SODIUM CHLORIDE 0.9 % IV SOLN
INTRAVENOUS | Status: DC
  Administered 2015-10-18: 04:00:00 via INTRAVENOUS

## 2015-10-18 MED ORDER — BISACODYL 5 MG PO TBEC: 5.0000 mg | DELAYED_RELEASE_TABLET | Freq: Every day | ORAL | Status: DC | PRN

## 2015-10-18 MED ORDER — METOPROLOL SUCCINATE ER 25 MG PO TB24
25.0000 mg | ORAL_TABLET | Freq: Two times a day (BID) | ORAL | Status: DC
Start: 2015-10-18 — End: 2015-10-19
  Administered 2015-10-18 – 2015-10-19 (×3): 25 mg via ORAL
  Filled 2015-10-18 (×3): qty 1

## 2015-10-18 MED ORDER — LORAZEPAM 2 MG/ML IJ SOLN
1.0000 mg | Freq: Once | INTRAMUSCULAR | Status: AC
  Administered 2015-10-17: 1 mg via INTRAVENOUS

## 2015-10-18 MED ORDER — IOHEXOL 350 MG/ML SOLN
50.0000 mL | Freq: Once | INTRAVENOUS | Status: AC | PRN
Start: 2015-10-18 — End: 2015-10-18
  Administered 2015-10-18: 50 mL via INTRAVENOUS

## 2015-10-18 MED ORDER — TRIAMCINOLONE ACETONIDE 55 MCG/ACT NA AERO
2.0000 | INHALATION_SPRAY | Freq: Every day | NASAL | Status: DC
  Administered 2015-10-18 – 2015-10-19 (×2): 2 via NASAL
  Filled 2015-10-18: qty 21.6

## 2015-10-18 MED ORDER — ATORVASTATIN CALCIUM 40 MG PO TABS: 40.0000 mg | ORAL_TABLET | Freq: Every day | ORAL | Status: DC

## 2015-10-18 MED ORDER — CIPROFLOXACIN IN D5W 400 MG/200ML IV SOLN
400.0000 mg | Freq: Two times a day (BID) | INTRAVENOUS | Status: DC
  Administered 2015-10-18 – 2015-10-19 (×3): 400 mg via INTRAVENOUS
  Filled 2015-10-18 (×3): qty 200

## 2015-10-18 MED ORDER — IRBESARTAN 150 MG PO TABS
150.0000 mg | ORAL_TABLET | Freq: Every day | ORAL | Status: DC
  Administered 2015-10-18 – 2015-10-19 (×2): 150 mg via ORAL
  Filled 2015-10-18 (×2): qty 1

## 2015-10-18 NOTE — Procedures (Signed)
ELECTROENCEPHALOGRAM REPORT  Date of Study: 10/18/2015  Patient's Name: [name] MRN: [id number] Date of Birth: 1926/04/15  Indication: 82 year old male with confusion/acute altered mental status  Medications: acetaminophen (TYLENOL) tablet 650 mg atorvastatin (LIPITOR) tablet 40 mg bisacodyl (DULCOLAX) EC tablet 5 mg ciprofloxacin (CIPRO) IVPB 400 mg HYDROmorphone (DILAUDID) injection 0.25 mg irbesartan (AVAPRO) tablet 150 mg metoprolol succinate (TOPROL-XL) 24 hr tablet 25 mg mitomycin (MUTAMYCIN) chemo injection 40 mg nitroGLYCERIN (NITROSTAT) SL tablet 0.4 mg pantoprazole (PROTONIX) EC tablet 80 mg triamcinolone (NASACORT) nasal inhaler 2 spray  Technical Summary: This is a multichannel digital EEG recording, using the international 10-20 placement system with electrodes applied with paste and impedances below 5000 ohms.    Description: The EEG background is symmetric, with a well-developed posterior dominant rhythm of 9 Hz, which is reactive to eye opening and closing.  Diffuse beta activity is seen, with a bilateral frontal preponderance.  No focal or generalized abnormalities are seen.  No focal or generalized epileptiform discharges are seen.  Stage II sleep is not seen.  Hyperventilation and photic stimulation were not performed.  ECG revealed normal cardiac rate and rhythm.  Impression: This is a normal routine EEG of the awake and drowsy states, with activating procedures.  A normal study does not rule out the possibility of a seizure disorder in this patient.  Adam R. Tomi Likens, DO

## 2015-10-18 NOTE — Progress Notes (Addendum)
Pt arrived to 5M10 from ED.  Pt is alert and oriented.  Per Dr. Nicole Kindred pt is Q4 neuro checks and vital signs and not Q2.  Tele applied.  Safety measures in place.  Pt refusing to wear SCDs at this time, pt educated about importance of SCDs and pt still refused.  Will continue to monitor.   Fredrich Romans, RN

## 2015-10-18 NOTE — ED Notes (Signed)
Patient transported to Ultrasound 

## 2015-10-18 NOTE — Progress Notes (Signed)
Subjective: Called to patients room by primary team due to patient having slurred speech after returning from EEG. EEG is normal. Upon entering room speech is stuttering but not dysarthric. Family states after having ativan last night his speech issues cleared. They feel this very well could be anxiety. We have non imaging of his vessels thus will obtain a CTA head and neck.   Exam: Filed Vitals:   10/18/15 0539 10/18/15 1035  BP: 119/95 125/61  Pulse: 75 70  Temp: 98.1 F (36.7 C) 97.6 F (36.4 C)  Resp: 16 16       Gen: In bed, NAD MS: alert and oriented CN: intact though out--no dysarthria Motor: diffusely weak but moving all extremities.  Sensory: intact   Pertinent Labs: none  Etta Quill PA-C [facility] [phone]  Impression:  82 year old man with coronary artery disease, hypertension and hyperlipidemia is entering with altered mental status with no focal neurologic deficit acutely and no abnormality demonstrated on imaging study of his brain. EEG is normal. Again today had transient dysarthria. Now clear but stuttering speech. This very well could be anxiety but will obtain images CTA head and neck to R/O any stenosis.   Recommendations: 1) CTA head and neck  Seen with Dr. Silverio Decamp. Please see his attestation note for A/P for any additional work up recommendations.   10/18/2015, 2:00 PM

## 2015-10-18 NOTE — ED Notes (Signed)
1st attempt to call report 

## 2015-10-18 NOTE — Progress Notes (Signed)
EEG completed; results pending.    

## 2015-10-18 NOTE — H&P (Signed)
[facility] History and Physical  [name] [id number] DOB: 10-24-1925 DOA: 10/17/2015  Referring physician: EDP PCP: Charolette Forward, MD   Chief Complaint: AMS   HPI: [name] is a 82 y.o. male with PMH including MI, recently admitted for nontraumatic psoas hematoma last week.  This is unchanged except for increased swelling of scrotum.  Patient is oriented to location, year, not month.  No cough, congestion, fever, chills, rash (other than hematoma), has abdominal wall pain, no N/V/D.  Review of Systems: Systems reviewed.  As above, otherwise negative  History reviewed. No pertinent past medical history. Past Surgical History  Procedure Laterality Date  . Tonsillectomy    . Colonoscopy  2007    Diverticulosis  . Transurethral resection of bladder tumor  07/27/2011    Procedure: TRANSURETHRAL RESECTION OF BLADDER TUMOR (TURBT);  Surgeon: Malka So;  Location: [facility];  Service: Urology;  Laterality: N/A;  Cystoscopy/Transurethral Resection of Bladder Tumor  . Ercp  02/01/2012    Procedure: ENDOSCOPIC RETROGRADE CHOLANGIOPANCREATOGRAPHY (ERCP);  Surgeon: Beryle Beams, MD;  Location: [facility];  Service: Endoscopy;  Laterality: N/A;  . Cataract extraction w/ intraocular lens  implant, bilateral Bilateral   . Laparoscopic cholecystectomy  06-27-2007    Dr Johnathan Hausen  . Cystoscopy w/ retrogrades Bilateral 12/26/2012    Procedure: CYSTOSCOPY WITH Bilateral RETROGRADE PYELOGRAM;  Surgeon: Malka So, MD;  Location: [facility];  Service: Urology;  Laterality: Bilateral;  BLADDER BIOPSY  . Fulguration of bladder tumor N/A 12/26/2012    Procedure: Multiple Bladder Biopsy;  Surgeon: Malka So, MD;  Location[facility];  Service: Urology;  Laterality: N/A;  . Coronary angioplasty with stent placement  11/06/2013    "1"  . Esophagogastroduodenoscopy (egd) with propofol N/A 01/29/2014    Procedure: ESOPHAGOGASTRODUODENOSCOPY (EGD) WITH  PROPOFOL;  Surgeon: Inda Castle, MD;  Location: [facility];  Service: Endoscopy;  Laterality: N/A;  . Left heart catheterization with coronary angiogram N/A 11/06/2013    Procedure: LEFT HEART CATHETERIZATION WITH CORONARY ANGIOGRAM;  Surgeon: Clent Demark, MD;  Location: [facility] C[facility];  Service: Cardiovascular;  Laterality: N/A;   Social History:  reports that he quit smoking about 42 years ago. His smoking use included Cigarettes. He has a 31 pack-year smoking history. He has never used smokeless tobacco. He reports that he drinks about 1.2 oz of alcohol per week. He reports that he does not use illicit drugs.  Allergies  Allergen Reactions  . Cephalexin Other (See Comments)    Bloody loose stools    Family History  Problem Relation Age of Onset  . Heart attack Father 58  . Colon cancer Mother 24  . Hypertension Mother   . Breast cancer Maternal Grandmother   . Heart attack Maternal Grandfather     late 71s  . Heart failure Sister   . Diabetes Neg Hx      Prior to Admission medications   Medication Sig Start Date End Date Taking? Authorizing Provider  acetaminophen (TYLENOL 8 HOUR) 650 MG CR tablet Take 1 tablet (650 mg total) by mouth every 8 (eight) hours as needed for pain. 10/08/15  Yes Charolette Forward, MD  atorvastatin (LIPITOR) 40 MG tablet Take 40 mg by mouth daily. 08/20/15  Yes Historical Provider, MD  bisacodyl (DULCOLAX) 5 MG EC tablet Take 1 tablet (5 mg total) by mouth daily as needed for moderate constipation. 10/08/15  Yes Charolette Forward, MD  CALCIUM PO Take by  mouth every evening.   Yes Historical Provider, MD  metoprolol succinate (TOPROL-XL) 50 MG 24 hr tablet Take 25 mg by mouth 2 (two) times daily. Take with or immediately following a meal. 12/19/13  Yes Hendricks Limes, MD  nitroGLYCERIN (NITROSTAT) 0.4 MG SL tablet Place 1 tablet (0.4 mg total) under the tongue every 5 (five) minutes x 3 doses as needed for chest pain. 11/08/13  Yes Charolette Forward, MD  omeprazole  (PRILOSEC) 40 MG capsule TAKE ONE CAPSULE BY MOUTH EVERY DAY Patient taking differently: Take 1 capsule (40 mg) by mouth every day. 02/12/15  Yes Amy S Esterwood, PA-C  triamcinolone (NASACORT ALLERGY 24HR) 55 MCG/ACT AERO nasal inhaler Place 2 sprays into the nose daily.   Yes Historical Provider, MD  valsartan (DIOVAN) 320 MG tablet Take 0.5 tablets (160 mg total) by mouth daily. --- Needs office visit for further refills. Patient taking differently: Take 160 mg by mouth every evening. --- Needs office visit for further refills. 06/18/15  Yes Hendricks Limes, MD   Physical Exam: Filed Vitals:   10/18/15 0145 10/18/15 0200  BP: 120/69 115/69  Pulse: 75 77  Temp:    Resp: 15 15    BP 115/69 mmHg  Pulse 77  Temp(Src) 97.5 F (36.4 C) (Oral)  Resp 15  Ht 5\' 9"  (1.753 m)  Wt 88.451 kg (195 lb)  BMI 28.78 kg/m2  SpO2 94%  General Appearance:    Alert, oriented, no distress, appears stated age  Head:    Normocephalic, atraumatic  Eyes:    PERRL, EOMI, sclera non-icteric        Nose:   Nares without drainage or epistaxis. Mucosa, turbinates normal  Throat:   Moist mucous membranes. Oropharynx without erythema or exudate.  Neck:   Supple. No carotid bruits.  No thyromegaly.  No lymphadenopathy.   Back:     No CVA tenderness, no spinal tenderness  Lungs:     Clear to auscultation bilaterally, without wheezes, rhonchi or rales  Chest wall:    No tenderness to palpitation  Heart:    Regular rate and rhythm without murmurs, gallops, rubs  Abdomen:     Soft, non-tender, nondistended, normal bowel sounds, no organomegaly  Genitalia:    deferred  Rectal:    deferred  Extremities:   No clubbing, cyanosis or edema.  Pulses:   2+ and symmetric all extremities  Skin:   Skin color, texture, turgor normal, no rashes or lesions  Lymph nodes:   Cervical, supraclavicular, and axillary nodes normal  Neurologic:   CNII-XII intact. Normal strength, sensation and reflexes      throughout    Labs  on Admission:  Basic Metabolic Panel:  Recent Labs Lab 10/17/15 2210 10/17/15 2226  NA 138 137  K 3.9 3.6  CL 102 98*  CO2 24  --   GLUCOSE 127* 127*  BUN 10 12  CREATININE 1.08 1.00  CALCIUM 9.1  --    Liver Function Tests:  Recent Labs Lab 10/17/15 2210  AST 20  ALT 12*  ALKPHOS 57  BILITOT 1.1  PROT 6.1*  ALBUMIN 3.3*   No results for input(s): LIPASE, AMYLASE in the last 168 hours. No results for input(s): AMMONIA in the last 168 hours. CBC:  Recent Labs Lab 10/17/15 2210 10/17/15 2226  WBC 8.4  --   NEUTROABS 4.9  --   HGB 12.7* 13.3  HCT 38.8* 39.0  MCV 95.6  --   PLT 253  --  Cardiac Enzymes: No results for input(s): CKTOTAL, CKMB, CKMBINDEX, TROPONINI in the last 168 hours.  BNP (last 3 results) No results for input(s): PROBNP in the last 8760 hours. CBG:  Recent Labs Lab 10/17/15 2234  GLUCAP 131*    Radiological Exams on Admission: Dg Chest 1 View  10/18/2015  CLINICAL DATA:  82 year old male with infection and sepsis. EXAM: CHEST 1 VIEW COMPARISON:  Radiograph dated 09/13/2015 and CT dated 11/06/2013 FINDINGS: Single-view of the chest does not demonstrate a focal consolidation. Minimal linear and platelike atelectatic changes noted at the right lung base. There is slight blunting of the left costophrenic angle which may be related to underlying subsegmental atelectasis/ scarring. A small left pleural effusion is not excluded. Stable cardiac silhouette. No acute osseous pathology. IMPRESSION: No focal consolidation. Electronically Signed   By: Anner Crete M.D.   On: 10/18/2015 01:08   Ct Head Wo Contrast  10/17/2015  CLINICAL DATA:  Altered mental status, last seen normal at 2100 hours. History of hypertension, hyperlipidemia. EXAM: CT HEAD WITHOUT CONTRAST TECHNIQUE: Contiguous axial images were obtained from the base of the skull through the vertex without intravenous contrast. COMPARISON:  MRI head September 13, 2015 FINDINGS: Moderately  motion degraded examination. The ventricles and sulci are normal for age. No intraparenchymal hemorrhage, mass effect nor midline shift. Patchy supratentorial white matter hypodensities are within normal range for patient's age and though non-specific suggest sequelae of chronic small vessel ischemic disease. No acute large vascular territory infarcts. RIGHT posterior frontal encephalomalacia. Old bilateral basal ganglia lacunar infarcts. No abnormal extra-axial fluid collections. Probable small RIGHT parafalcine arachnoid cyst. Basal cisterns are patent. Moderate calcific atherosclerosis of the carotid siphons. No skull fracture. The included ocular globes and orbital contents are non-suspicious. Status post bilateral ocular lens implants. LEFT maxillary mucosal retention cyst and mild paranasal sinus mucosal thickening. IMPRESSION: No acute intracranial process on this moderately motion degraded examination. Stable chronic changes including old RIGHT posterior frontal lobe infarct, basal ganglia lacunar infarcts and moderate chronic small vessel ischemic disease. Acute findings discussed with and reconfirmed by Dr.Stewart, neurology on 10/17/2015 at 10:32 pm. Electronically Signed   By: Elon Alas M.D.   On: 10/17/2015 22:34   Mr Brain Wo Contrast  10/18/2015  CLINICAL DATA:  Acute onset RIGHT facial droop, confusion and difficulty moving at 2100 hours today. Off Plavix for flank hematoma. History of hyperlipidemia, memory loss, bladder cancer, hypertension. EXAM: MRI HEAD WITHOUT CONTRAST TECHNIQUE: Multiplanar, multiecho pulse sequences of the brain and surrounding structures were obtained without intravenous contrast. COMPARISON:  CT head October 17, 2015 at 2220 hours and MRI of the head September 13, 2015 FINDINGS: Multiple sequences are mildly or moderately motion degraded. The ventricles and sulci are normal for patient's age. No abnormal parenchymal signal, mass lesions, mass effect. Smaller RIGHT  frontal encephalomalacia. Patchy supratentorial and pontine white matter FLAIR T2 hyperintensities. Old RIGHT thalamus lacunar infarct, probable basal ganglia lacunar infarcts. No reduced diffusion to suggest acute ischemia. No susceptibility artifact to suggest hemorrhage. Similar symmetric prominent bifrontal extra-axial fluid spaces measuring up to 5 mm without mass effect could represent old subdural hematomas or, underlying parenchymal brain volume loss. No extra-axial masses though, contrast enhanced sequences would be more sensitive. RIGHT mesial frontal probable arachnoid cyst is unchanged. Normal major intracranial vascular flow voids seen at the skull base. Status post bilateral ocular lens implants. No abnormal sellar expansion. No suspicious calvarial bone marrow signal. Craniocervical junction maintained. Small LEFT maxillary mucosal retention cyst. IMPRESSION: No  acute intracranial process, specifically no acute ischemia on this motion degraded examination. Chronic changes, including RIGHT frontal encephalomalacia which may represent old infarct or head injury, mild to moderate chronic small vessel ischemic disease. Electronically Signed   By: Elon Alas M.D.   On: 10/18/2015 00:08   Ct Abdomen Pelvis W Contrast  10/18/2015  CLINICAL DATA:  Acute onset of left testicular pain. Generalized abdominal guarding. Initial encounter. EXAM: CT ABDOMEN AND PELVIS WITH CONTRAST TECHNIQUE: Multidetector CT imaging of the abdomen and pelvis was performed using the standard protocol following bolus administration of intravenous contrast. CONTRAST:  133mL OMNIPAQUE IOHEXOL 300 MG/ML  SOLN COMPARISON:  CT of the abdomen and pelvis from 10/06/2015 FINDINGS: Trace left pleural fluid is noted, with mild bibasilar atelectasis. Diffuse coronary artery calcifications are seen. A tiny hiatal hernia is noted. The left lateral abdominal wall and retroperitoneal hematoma has decreased mildly in size from the prior  study, measuring approximately 13.2 x 8.8 x 4.6 cm, with a small amount of additional blood extending inferiorly along the left inguinal canal. The hematoma is somewhat better organized than on the prior study. The liver and spleen are unremarkable in appearance. The patient is status post cholecystectomy, with clips noted at the gallbladder fossa. The pancreas and adrenal glands are unremarkable. Scattered bilateral renal cysts are noted, more prominent on the right. Nonspecific perinephric stranding is noted bilaterally. There is no evidence of hydronephrosis. No renal or ureteral stones are identified. No free fluid is identified. The small bowel is unremarkable in appearance. The stomach is within normal limits. No acute vascular abnormalities are seen. Scattered calcification is noted along the abdominal aorta and its branches. There is mild aneurysmal dilatation of the infrarenal abdominal aorta, with several small outpouchings. A small right posterior outpouching is noted both proximally, measuring 3.4 cm in maximal diameter. There is then mild fusiform dilatation, measuring 3.2 cm in AP dimension and 3.3 cm in transverse dimension, followed by additional transverse then AP dilatation, measuring up to 4.0 cm in maximal transverse dimension. Mild associated mural thrombus is noted, without significant luminal narrowing. 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 The appendix is normal in caliber, without evidence of appendicitis. Scattered diverticulosis is noted along the descending and sigmoid colon, without evidence of diverticulitis. The bladder is mildly distended and grossly unremarkable. The prostate is mildly enlarged, measuring 5.0 cm in transverse dimension. Small bilateral inguinal hernias are seen, containing only fat. No inguinal lymphadenopathy is seen. No acute osseous abnormalities are identified. IMPRESSION: 1. Left lateral  abdominal wall and retroperitoneal hematoma has decreased mildly in size from the prior study, measuring 13.2 x 8.8 x 4.6 cm, with a small amount of additional blood tracking inferiorly along the left inguinal canal. The hematoma is somewhat better organized than on the prior study. This may correspond to the patient's symptoms. 2. Mild aneurysmal dilatation of the infrarenal abdominal aorta, with several outpouchings. The abdominal aorta measures at most 4.0 cm in maximal transverse dimension. 3.3 cm focal aneurysm also noted along the proximal right common iliac artery. Mild associated mural thrombus noted. Recommend followup by ultrasound in 1 year. This recommendation follows ACR consensus guidelines: White Paper of the ACR Incidental Findings Committee II on Vascular Findings. J Am Coll Radiol 2013; 10:789-794. 3. Diffuse coronary artery calcifications seen. 4. Trace left pleural fluid, with mild bibasilar atelectasis. 5. Tiny hiatal hernia seen. 6. Scattered bilateral renal cysts, more prominent on  the right. 7. Scattered calcification along the abdominal aorta and its branches. 8. Scattered diverticulosis along the descending and sigmoid colon, without evidence of diverticulitis. 9. Mildly enlarged prostate noted. 10. Small bilateral inguinal hernias, containing only fat. Electronically Signed   By: Garald Balding M.D.   On: 10/18/2015 01:22    EKG: Independently reviewed.  Assessment/Plan Active Problems:   Altered mental status   1. AMS - 1. Will do US scrotum to make sure there isnt any ischemia associated with hematoma but this seems less likely 2. Overall hematoma on CT scan appears slightly smaller 3. HGB is actually improved from 11 during last admit to 12.7 now. 4. Remainder CT abd/pelvis negative for acute pathology (see above). 5. Got ABX in ED x 1 dose, stopping ABx for now as he has 0/4 SIRS criteria, and no obvious source (negative CXR, few WBC and trace LE in UA), urine cultures  pending.  Resume ABx if he develops any SIRS, or source, etc. 6. Neurology has seen patient, see their note, agree with note except will hold off on ABx for now as noted above.    Code Status: Full  Family Communication: Family at bedside Disposition Plan: Admit to obs   Time spent: 70 min  GARDNER, JARED M. [facility] Pager [phone]  If 7AM-7PM, please contact the day team taking care of the patient [email or web address] Password [email or web address] 10/18/2015, 2:17 AM

## 2015-10-18 NOTE — Evaluation (Signed)
Clinical/Bedside Swallow Evaluation Patient Details  Name: [name] MRN: [id number] Date of Birth: 25-Nov-1925  Today's Date: 10/18/2015 Time: SLP Start Time (ACUTE ONLY): [id number] SLP Stop Time (ACUTE ONLY): 1608 SLP Time Calculation (min) (ACUTE ONLY): 14 min  Past Medical History: History reviewed. No pertinent past medical history. Past Surgical History:  Past Surgical History  Procedure Laterality Date  . Tonsillectomy    . Colonoscopy  2007    Diverticulosis  . Transurethral resection of bladder tumor  07/27/2011    Procedure: TRANSURETHRAL RESECTION OF BLADDER TUMOR (TURBT);  Surgeon: Malka So;  Location: [facility];  Service: Urology;  Laterality: N/A;  Cystoscopy/Transurethral Resection of Bladder Tumor  . Ercp  02/01/2012    Procedure: ENDOSCOPIC RETROGRADE CHOLANGIOPANCREATOGRAPHY (ERCP);  Surgeon: Beryle Beams, MD;  Location: [facility];  Service: Endoscopy;  Laterality: N/A;  . Cataract extraction w/ intraocular lens  implant, bilateral Bilateral   . Laparoscopic cholecystectomy  06-27-2007    Dr Johnathan Hausen  . Cystoscopy w/ retrogrades Bilateral 12/26/2012    Procedure: CYSTOSCOPY WITH Bilateral RETROGRADE PYELOGRAM;  Surgeon: Malka So, MD;  Location: [facility];  Service: Urology;  Laterality: Bilateral;  BLADDER BIOPSY  . Fulguration of bladder tumor N/A 12/26/2012    Procedure: Multiple Bladder Biopsy;  Surgeon: Malka So, MD;  Location: [facility];  Service: Urology;  Laterality: N/A;  . Coronary angioplasty with stent placement  11/06/2013    "1"  . Esophagogastroduodenoscopy (egd) with propofol N/A 01/29/2014    Procedure: ESOPHAGOGASTRODUODENOSCOPY (EGD) WITH PROPOFOL;  Surgeon: Inda Castle, MD;  Location: [facility];  Service: Endoscopy;  Laterality: N/A;  . Left heart catheterization with coronary angiogram N/A 11/06/2013    Procedure: LEFT HEART CATHETERIZATION WITH CORONARY ANGIOGRAM;  Surgeon: Clent Demark, MD;   Location: [facility] C[facility] LAB;  Service: Cardiovascular;  Laterality: N/A;   HPI:  82 year old male who was admitted 10/17/2015 for sudden onset confusion, gross weakness, and unintelligible speech. MRI negative. Pt initially passed the RN stroke swallow screen but developed difficulty swallowing in the afternoon on 2/27, therefore he was made NPO and SLP was asked to evaluate. Pt denies h/o swallowing difficulty but also states that he was told to use a chin tuck in the past.   Assessment / Plan / Recommendation Clinical Impression  Pt provides somewhat unclear history, given inability to describe difficulties experienced earlier today. He also states that he has never had difficulty in the past, but then says that he was told to use a chin tuck. During skilled observation this afternoon, multiple swallows were noted with thin liquids and an immediate cough was observed x1 as pt was trying to eat two graham crackers at one time. Min cues for slower pacing eliminated further signs of possible penetration/aspiration. Recommend to restart regular textures and thin liquids with SLP f/u x1 to ensure tolerance.    Aspiration Risk  Mild aspiration risk    Diet Recommendation Regular;Thin liquid   Liquid Administration via: Cup;Straw Medication Administration: Whole meds with liquid Supervision: Patient able to self feed;Full supervision/cueing for compensatory strategies Compensations: Slow rate;Small sips/bites Postural Changes: Seated upright at 90 degrees    Other  Recommendations Oral Care Recommendations: Oral care BID   Follow up Recommendations  None    Frequency and Duration min 1 x/week  1 week       Prognosis        Swallow Study   General HPI: 82 year  old male who was admitted 10/17/2015 for sudden onset confusion, gross weakness, and unintelligible speech. MRI negative. Pt initially passed the RN stroke swallow screen but developed difficulty swallowing in the afternoon on 2/27,  therefore he was made NPO and SLP was asked to evaluate. Pt denies h/o swallowing difficulty but also states that he was told to use a chin tuck in the past. Type of Study: Bedside Swallow Evaluation Previous Swallow Assessment: none in chart Diet Prior to this Study: NPO Temperature Spikes Noted: No Respiratory Status: Room air History of Recent Intubation: No Behavior/Cognition: Alert;Cooperative;Pleasant mood;Requires cueing Oral Cavity Assessment: Within Functional Limits Oral Care Completed by SLP: No Oral Cavity - Dentition: Adequate natural dentition Vision: Functional for self-feeding Self-Feeding Abilities: Able to feed self Patient Positioning: Upright in bed Baseline Vocal Quality: Normal Volitional Swallow: Able to elicit    Oral/Motor/Sensory Function Overall Oral Motor/Sensory Function: Within functional limits   Ice Chips Ice chips: Not tested   Thin Liquid Thin Liquid: Impaired Presentation: Cup;Self Fed;Straw Pharyngeal  Phase Impairments: Multiple swallows    Nectar Thick Nectar Thick Liquid: Not tested   Honey Thick Honey Thick Liquid: Not tested   Puree Puree: Within functional limits Presentation: Self Fed;Spoon   Solid   GO   Solid: Impaired Presentation: Self Fed Pharyngeal Phase Impairments: Cough - Immediate (x1)    Functional Assessment Tool Used: skilled clinical judgment Functional Limitations: Swallowing Swallow Current Status [id number]): At least 1 percent but less than 20 percent impaired, limited or restricted Swallow Goal Status [phone]): At least 1 percent but less than 20 percent impaired, limited or restricted    [name], M.A. CCC-SLP [phone]  [name] 10/18/2015,4:27 PM

## 2015-10-18 NOTE — Progress Notes (Signed)
PROGRESS NOTE  [name] [id number] DOB: March 29, 1926 DOA: 10/17/2015 PCP: Charolette Forward, MD Outpatient Specialists:      Brief Narrative: [name] is an 82 year old male who was admitted 10/17/2015 for sudden onset confusion, gross weakness, and unintelligible speech.  Assessment & Plan: Active Problems:   Altered mental status  TIA - His mental status returned to baseline. Since this morning and he appeared to be sharp, no slurred speech,  had underwent an EEG and when he returned from the EEG had slurred speech again. Discussed with neurology.  - CT/MRI 10/17/2015 showed no acute intracranial process; stable chronic changes noted from previous infarcts.  - EEG 10/18/2015 - negative. No epilepiform discharges noted. - On ciprofloxacin for UTI  Iliopsoas hematoma - Resolving. Patient reports no abdominal pain - U/S of scrotum revealed left varicocele. Otherwise WNL.  CAD - no chest pain, stable  Hypertension  Hyperlipidemia  CKD stage II - stable  Prior CVA  DVT prophylaxis: SCD's; patient refuses. Code Status: Full  Family Communication: Wife and daughter at bedside. Disposition Plan: Home when ready. Barriers for discharge: EEG pending  Consultants:   Neurology  Procedures:   EEG 10/18/2015  CTA head/neck pending 10/18/2015  Antimicrobials:  Ciprofloxacin 2/27>>  Subjective: - no complaints initially, asking to go home in am, later with slurred speech  Objective: Filed Vitals:   10/18/15 0315 10/18/15 0330 10/18/15 0504 10/18/15 0539  BP: 147/87 122/76 152/66 119/95  Pulse: 75 81 81 75  Temp:   97.8 F (36.6 C) 98.1 F (36.7 C)  TempSrc:   Oral Oral  Resp: 17 19 18 16   Height:      Weight:      SpO2: 100% 97% 100% 100%    Intake/Output Summary (Last 24 hours) at 10/18/15 1025 Last data filed at 10/18/15 0646  Gross per 24 hour  Intake   1000 ml  Output    750 ml  Net    250 ml   Filed Weights   10/17/15 2230  Weight: 88.451 kg  (195 lb)    Examination: BP 119/95 mmHg  Pulse 75  Temp(Src) 98.1 F (36.7 C) (Oral)  Resp 16  Ht 5\' 9"  (1.753 m)  Wt 88.451 kg (195 lb)  BMI 28.78 kg/m2  SpO2 100% General exam: NAD Respiratory system: Clear. No increased work of breathing. No wheezing, no crackles Cardiovascular system: regular rate and rhythm, no murmurs, gallops. No JVD. No peripheral edema.  Gastrointestinal system: Abdomen is nondistended, soft and nontender. Normal bowel sounds heard. Central nervous system: AxOx3. No focal deficits. Slurred speech in pm Extremities: No clubbing/cyanosis Skin: no rashes except for large ecchymosis on the left flank extending to the umbilicus.   Data Reviewed: I have personally reviewed following labs and imaging studies  CBC:  Recent Labs Lab 10/17/15 2210 10/17/15 2226  WBC 8.4  --   NEUTROABS 4.9  --   HGB 12.7* 13.3  HCT 38.8* 39.0  MCV 95.6  --   PLT 253  --    Basic Metabolic Panel:  Recent Labs Lab 10/17/15 2210 10/17/15 2226  NA 138 137  K 3.9 3.6  CL 102 98*  CO2 24  --   GLUCOSE 127* 127*  BUN 10 12  CREATININE 1.08 1.00  CALCIUM 9.1  --    GFR: Estimated Creatinine Clearance: 56.2 mL/min (by C-G formula based on Cr of 1). Liver Function Tests:  Recent Labs Lab 10/17/15 2210  AST 20  ALT 12*  ALKPHOS 57  BILITOT 1.1  PROT 6.1*  ALBUMIN 3.3*   No results for input(s): LIPASE, AMYLASE in the last 168 hours.  Recent Labs Lab 10/18/15 0256  AMMONIA 15   Coagulation Profile:  Recent Labs Lab 10/17/15 2210  INR 0.98   Cardiac Enzymes: No results for input(s): CKTOTAL, CKMB, CKMBINDEX, TROPONINI in the last 168 hours. BNP (last 3 results) No results for input(s): PROBNP in the last 8760 hours. HbA1C: No results for input(s): HGBA1C in the last 72 hours. CBG:  Recent Labs Lab 10/17/15 2234  GLUCAP 131*   Lipid Profile: No results for input(s): CHOL, HDL, LDLCALC, TRIG, CHOLHDL, LDLDIRECT in the last 72  hours. Thyroid Function Tests: No results for input(s): TSH, T4TOTAL, FREET4, T3FREE, THYROIDAB in the last 72 hours. Anemia Panel: No results for input(s): VITAMINB12, FOLATE, FERRITIN, TIBC, IRON, RETICCTPCT in the last 72 hours. Urine analysis:    Component Value Date/Time   COLORURINE AMBER* 10/17/2015 2311   APPEARANCEUR CLOUDY* 10/17/2015 2311   LABSPEC 1.025 10/17/2015 2311   PHURINE 6.0 10/17/2015 2311   GLUCOSEU NEGATIVE 10/17/2015 2311   HGBUR NEGATIVE 10/17/2015 2311   HGBUR trace-lysed 12/30/2009 1201   BILIRUBINUR SMALL* 10/17/2015 2311   BILIRUBINUR Neg 05/31/2011 [place] 10/17/2015 2311   PROTEINUR NEGATIVE 10/17/2015 2311   PROTEINUR Neg 05/31/2011 1528   UROBILINOGEN 2.0* 01/30/2012 1156   UROBILINOGEN 0.2 05/31/2011 1528   NITRITE NEGATIVE 10/17/2015 2311   NITRITE Neg 05/31/2011 1528   LEUKOCYTESUR TRACE* 10/17/2015 2311   Sepsis Labs: Invalid input(s): PROCALCITONIN, LACTICIDVEN  No results found for this or any previous visit (from the past 240 hour(s)).    Radiology Studies: Dg Chest 1 View  10/18/2015  CLINICAL DATA:  82 year old male with infection and sepsis. EXAM: CHEST 1 VIEW COMPARISON:  Radiograph dated 09/13/2015 and CT dated 11/06/2013 FINDINGS: Single-view of the chest does not demonstrate a focal consolidation. Minimal linear and platelike atelectatic changes noted at the right lung base. There is slight blunting of the left costophrenic angle which may be related to underlying subsegmental atelectasis/ scarring. A small left pleural effusion is not excluded. Stable cardiac silhouette. No acute osseous pathology. IMPRESSION: No focal consolidation. Electronically Signed   By: Anner Crete M.D.   On: 10/18/2015 01:08   Ct Head Wo Contrast  10/17/2015  CLINICAL DATA:  Altered mental status, last seen normal at 2100 hours. History of hypertension, hyperlipidemia. EXAM: CT HEAD WITHOUT CONTRAST TECHNIQUE: Contiguous axial images  were obtained from the base of the skull through the vertex without intravenous contrast. COMPARISON:  MRI head September 13, 2015 FINDINGS: Moderately motion degraded examination. The ventricles and sulci are normal for age. No intraparenchymal hemorrhage, mass effect nor midline shift. Patchy supratentorial white matter hypodensities are within normal range for patient's age and though non-specific suggest sequelae of chronic small vessel ischemic disease. No acute large vascular territory infarcts. RIGHT posterior frontal encephalomalacia. Old bilateral basal ganglia lacunar infarcts. No abnormal extra-axial fluid collections. Probable small RIGHT parafalcine arachnoid cyst. Basal cisterns are patent. Moderate calcific atherosclerosis of the carotid siphons. No skull fracture. The included ocular globes and orbital contents are non-suspicious. Status post bilateral ocular lens implants. LEFT maxillary mucosal retention cyst and mild paranasal sinus mucosal thickening. IMPRESSION: No acute intracranial process on this moderately motion degraded examination. Stable chronic changes including old RIGHT posterior frontal lobe infarct, basal ganglia lacunar infarcts and moderate chronic small vessel ischemic disease. Acute findings discussed with and reconfirmed by Dr.Stewart,  neurology on 10/17/2015 at 10:32 pm. Electronically Signed   By: Elon Alas M.D.   On: 10/17/2015 22:34   Mr Brain Wo Contrast  10/18/2015  CLINICAL DATA:  Acute onset RIGHT facial droop, confusion and difficulty moving at 2100 hours today. Off Plavix for flank hematoma. History of hyperlipidemia, memory loss, bladder cancer, hypertension. EXAM: MRI HEAD WITHOUT CONTRAST TECHNIQUE: Multiplanar, multiecho pulse sequences of the brain and surrounding structures were obtained without intravenous contrast. COMPARISON:  CT head October 17, 2015 at 2220 hours and MRI of the head September 13, 2015 FINDINGS: Multiple sequences are mildly or  moderately motion degraded. The ventricles and sulci are normal for patient's age. No abnormal parenchymal signal, mass lesions, mass effect. Smaller RIGHT frontal encephalomalacia. Patchy supratentorial and pontine white matter FLAIR T2 hyperintensities. Old RIGHT thalamus lacunar infarct, probable basal ganglia lacunar infarcts. No reduced diffusion to suggest acute ischemia. No susceptibility artifact to suggest hemorrhage. Similar symmetric prominent bifrontal extra-axial fluid spaces measuring up to 5 mm without mass effect could represent old subdural hematomas or, underlying parenchymal brain volume loss. No extra-axial masses though, contrast enhanced sequences would be more sensitive. RIGHT mesial frontal probable arachnoid cyst is unchanged. Normal major intracranial vascular flow voids seen at the skull base. Status post bilateral ocular lens implants. No abnormal sellar expansion. No suspicious calvarial bone marrow signal. Craniocervical junction maintained. Small LEFT maxillary mucosal retention cyst. IMPRESSION: No acute intracranial process, specifically no acute ischemia on this motion degraded examination. Chronic changes, including RIGHT frontal encephalomalacia which may represent old infarct or head injury, mild to moderate chronic small vessel ischemic disease. Electronically Signed   By: Elon Alas M.D.   On: 10/18/2015 00:08   US Scrotum  10/18/2015  CLINICAL DATA:  Acute onset of scrotal swelling.  Initial encounter. EXAM: SCROTAL ULTRASOUND DOPPLER ULTRASOUND OF THE TESTICLES TECHNIQUE: Complete ultrasound examination of the testicles, epididymis, and other scrotal structures was performed. Color and spectral Doppler ultrasound were also utilized to evaluate blood flow to the testicles. COMPARISON:  CT of the abdomen and pelvis performed earlier today at 12:08 a.m. FINDINGS: Right testicle Measurements: 4.1 x 1.6 x 3.2 cm. No mass or microlithiasis visualized. Left testicle  Measurements: 4.0 x 1.9 x 2.6 cm. No mass or microlithiasis visualized. Right epididymis:  Normal in size and appearance. Left epididymis:  Normal in size and appearance. Hydrocele:  Trace hydroceles remain within normal limits. Varicocele:  A patent left-sided varicocele is noted. Pulsed Doppler interrogation of both testes demonstrates normal low resistance arterial and venous waveforms bilaterally. IMPRESSION: 1. No evidence of testicular torsion. Testes unremarkable in appearance. 2. Patent left-sided varicocele noted. Electronically Signed   By: Garald Balding M.D.   On: 10/18/2015 05:01   Ct Abdomen Pelvis W Contrast  10/18/2015  CLINICAL DATA:  Acute onset of left testicular pain. Generalized abdominal guarding. Initial encounter. EXAM: CT ABDOMEN AND PELVIS WITH CONTRAST TECHNIQUE: Multidetector CT imaging of the abdomen and pelvis was performed using the standard protocol following bolus administration of intravenous contrast. CONTRAST:  188mL OMNIPAQUE IOHEXOL 300 MG/ML  SOLN COMPARISON:  CT of the abdomen and pelvis from 10/06/2015 FINDINGS: Trace left pleural fluid is noted, with mild bibasilar atelectasis. Diffuse coronary artery calcifications are seen. A tiny hiatal hernia is noted. The left lateral abdominal wall and retroperitoneal hematoma has decreased mildly in size from the prior study, measuring approximately 13.2 x 8.8 x 4.6 cm, with a small amount of additional blood extending inferiorly along the left inguinal canal.  The hematoma is somewhat better organized than on the prior study. The liver and spleen are unremarkable in appearance. The patient is status post cholecystectomy, with clips noted at the gallbladder fossa. The pancreas and adrenal glands are unremarkable. Scattered bilateral renal cysts are noted, more prominent on the right. Nonspecific perinephric stranding is noted bilaterally. There is no evidence of hydronephrosis. No renal or ureteral stones are identified. No free  fluid is identified. The small bowel is unremarkable in appearance. The stomach is within normal limits. No acute vascular abnormalities are seen. Scattered calcification is noted along the abdominal aorta and its branches. There is mild aneurysmal dilatation of the infrarenal abdominal aorta, with several small outpouchings. A small right posterior outpouching is noted both proximally, measuring 3.4 cm in maximal diameter. There is then mild fusiform dilatation, measuring 3.2 cm in AP dimension and 3.3 cm in transverse dimension, followed by additional transverse then AP dilatation, measuring up to 4.0 cm in maximal transverse dimension. Mild associated mural thrombus is noted, without significant luminal narrowing. 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 The appendix is normal in caliber, without evidence of appendicitis. Scattered diverticulosis is noted along the descending and sigmoid colon, without evidence of diverticulitis. The bladder is mildly distended and grossly unremarkable. The prostate is mildly enlarged, measuring 5.0 cm in transverse dimension. Small bilateral inguinal hernias are seen, containing only fat. No inguinal lymphadenopathy is seen. No acute osseous abnormalities are identified. IMPRESSION: 1. Left lateral abdominal wall and retroperitoneal hematoma has decreased mildly in size from the prior study, measuring 13.2 x 8.8 x 4.6 cm, with a small amount of additional blood tracking inferiorly along the left inguinal canal. The hematoma is somewhat better organized than on the prior study. This may correspond to the patient's symptoms. 2. Mild aneurysmal dilatation of the infrarenal abdominal aorta, with several outpouchings. The abdominal aorta measures at most 4.0 cm in maximal transverse dimension. 3.3 cm focal aneurysm also noted along the proximal right common iliac artery. Mild associated mural thrombus noted.  Recommend followup by ultrasound in 1 year. This recommendation follows ACR consensus guidelines: White Paper of the ACR Incidental Findings Committee II on Vascular Findings. J Am Coll Radiol 2013; 10:789-794. 3. Diffuse coronary artery calcifications seen. 4. Trace left pleural fluid, with mild bibasilar atelectasis. 5. Tiny hiatal hernia seen. 6. Scattered bilateral renal cysts, more prominent on the right. 7. Scattered calcification along the abdominal aorta and its branches. 8. Scattered diverticulosis along the descending and sigmoid colon, without evidence of diverticulitis. 9. Mildly enlarged prostate noted. 10. Small bilateral inguinal hernias, containing only fat. Electronically Signed   By: Garald Balding M.D.   On: 10/18/2015 01:22   [place] Art/ven Flow Abd Pelv Doppler  10/18/2015  CLINICAL DATA:  Acute onset of scrotal swelling.  Initial encounter. EXAM: SCROTAL ULTRASOUND DOPPLER ULTRASOUND OF THE TESTICLES TECHNIQUE: Complete ultrasound examination of the testicles, epididymis, and other scrotal structures was performed. Color and spectral Doppler ultrasound were also utilized to evaluate blood flow to the testicles. COMPARISON:  CT of the abdomen and pelvis performed earlier today at 12:08 a.m. FINDINGS: Right testicle Measurements: 4.1 x 1.6 x 3.2 cm. No mass or microlithiasis visualized. Left testicle Measurements: 4.0 x 1.9 x 2.6 cm. No mass or microlithiasis visualized. Right epididymis:  Normal in size and appearance. Left epididymis:  Normal in size and appearance. Hydrocele:  Trace hydroceles remain within normal limits. Varicocele:  A  patent left-sided varicocele is noted. Pulsed Doppler interrogation of both testes demonstrates normal low resistance arterial and venous waveforms bilaterally. IMPRESSION: 1. No evidence of testicular torsion. Testes unremarkable in appearance. 2. Patent left-sided varicocele noted. Electronically Signed   By: Garald Balding M.D.   On: 10/18/2015 05:01      Scheduled Meds: . atorvastatin  40 mg Oral q1800  . ciprofloxacin  400 mg Intravenous Q12H  .  HYDROmorphone (DILAUDID) injection  0.25 mg Intravenous Once  . irbesartan  150 mg Oral Daily  . LORazepam      . metoprolol succinate  25 mg Oral BID  . pantoprazole  80 mg Oral Daily  . triamcinolone  2 spray Nasal Daily   Continuous Infusions: . sodium chloride 75 mL/hr at 10/18/15 0330    Marzetta Board, MD, PhD [facility] Pager [phone]  If 7PM-7AM, please contact night-coverage [email or web address] Password [facility] 10/18/2015, 10:25 AM

## 2015-10-18 NOTE — Care Management Obs Status (Signed)
[place]   Patient Details  Name: BRIYAN CRIDDLE MRN: [id number] Date of Birth: 09/22/1925   Medicare Observation Status Notification Given:  Yes (MRI negative)    Pollie Friar, RN 10/18/2015, 2:40 PM

## 2015-10-19 DIAGNOSIS — G459 Transient cerebral ischemic attack, unspecified: Secondary | ICD-10-CM | POA: Diagnosis not present

## 2015-10-19 DIAGNOSIS — N3000 Acute cystitis without hematuria: Secondary | ICD-10-CM

## 2015-10-19 DIAGNOSIS — N390 Urinary tract infection, site not specified: Secondary | ICD-10-CM | POA: Diagnosis present

## 2015-10-19 DIAGNOSIS — R410 Disorientation, unspecified: Secondary | ICD-10-CM | POA: Diagnosis not present

## 2015-10-19 LAB — URINE CULTURE: Culture: NO GROWTH

## 2015-10-19 MED ORDER — CIPROFLOXACIN HCL 500 MG PO TABS: 500.0000 mg | ORAL_TABLET | Freq: Two times a day (BID) | ORAL | Status: DC

## 2015-10-19 MED ORDER — ZOLPIDEM TARTRATE 5 MG PO TABS
5.0000 mg | ORAL_TABLET | Freq: Once | ORAL | Status: AC
  Administered 2015-10-19: 5 mg via ORAL
  Filled 2015-10-19: qty 1

## 2015-10-19 NOTE — Discharge Summary (Addendum)
Physician Discharge Summary  [name] [id number] DOB: March 06, 1926 DOA: 10/17/2015  PCP: Charolette Forward, MD  Admit date: 10/17/2015 Discharge date: 10/19/2015  Time spent: < 30 minutes  Recommendations for Outpatient Follow-up:  1. Follow up with Dr. Terrence Dupont as scheduled   Discharge Diagnoses:  Active Problems:   Altered mental status   Stroke-like symptoms   UTI (urinary tract infection)  Discharge Condition: stable  Diet recommendation: heart healthy  Filed Weights   10/17/15 2230  Weight: 88.451 kg (195 lb)    History of present illness:  See H&P, Labs, Consult and Test reports for all details in brief, patient is a 82 year old male who was admitted 10/17/2015 for sudden onset confusion, gross weakness, and unintelligible speech.  Hospital Course:  TIA - His mental status returned to baseline. Neurology was consulted and have followed patient while hospitalized. CT/MRI/CTA 10/17/2015 showed no acute intracranial process; stable chronic changes noted from previous infarcts. EEG obtained and was negative without no epilepiform discharges noted. Neurology recommended against further neurologic testing. Patient returned to baseline and was discharged home in stable condition. Patient declined working with PT on discharge day. Iliopsoas hematoma - Resolving. Patient reports no abdominal pain. U/S of scrotum revealed left varicocele. Otherwise WNL. UTI - probable UTI, empiric Ciprofloxacin.  CAD - no chest pain, stable Hypertension Hyperlipidemia CKD stage II - stable Prior CVA  Procedures:  None    Consultations:  Neurology   Discharge Exam: Filed Vitals:   10/18/15 2201 10/19/15 0146 10/19/15 0919 10/19/15 1332  BP: 125/82 152/65 130/66 111/57  Pulse: 82 83 90 69  Temp: 98.2 F (36.8 C) 98.1 F (36.7 C) 99.3 F (37.4 C) 98.6 F (37 C)  TempSrc: Oral Oral Oral Oral  Resp: 16 16 20 18   Height:      Weight:      SpO2: 96% 95% 98% 98%   General:  NAD Cardiovascular: RRR Respiratory: CTA biL  Discharge Instructions Activity:  As tolerated   Get Medicines reviewed and adjusted: Please take all your medications with you for your next visit with your Primary MD  Please request your Primary MD to go over all hospital tests and procedure/radiological results at the follow up, please ask your Primary MD to get all Hospital records sent to his/her office.  If you experience worsening of your admission symptoms, develop shortness of breath, life threatening emergency, suicidal or homicidal thoughts you must seek medical attention immediately by calling 911 or calling your MD immediately if symptoms less severe.  You must read complete instructions/literature along with all the possible adverse reactions/side effects for all the Medicines you take and that have been prescribed to you. Take any new Medicines after you have completely understood and accpet all the possible adverse reactions/side effects.   Do not drive when taking Pain medications.   Do not take more than prescribed Pain, Sleep and Anxiety Medications  Special Instructions: If you have smoked or chewed Tobacco in the last 2 yrs please stop smoking, stop any regular Alcohol and or any Recreational drug use.  Wear Seat belts while driving.  Please note  You were cared for by a hospitalist during your hospital stay. Once you are discharged, your primary care physician will handle any further medical issues. Please note that NO REFILLS for any discharge medications will be authorized once you are discharged, as it is imperative that you return to your primary care physician (or establish a relationship with a primary care physician  if you do not have one) for your aftercare needs so that they can reassess your need for medications and monitor your lab values.    Medication List    TAKE these medications        acetaminophen 650 MG CR tablet  Commonly known as:  TYLENOL  8 HOUR  Take 1 tablet (650 mg total) by mouth every 8 (eight) hours as needed for pain.     atorvastatin 40 MG tablet  Commonly known as:  LIPITOR  Take 40 mg by mouth daily.     bisacodyl 5 MG EC tablet  Commonly known as:  DULCOLAX  Take 1 tablet (5 mg total) by mouth daily as needed for moderate constipation.     CALCIUM PO  Take by mouth every evening.     ciprofloxacin 500 MG tablet  Commonly known as:  CIPRO  Take 1 tablet (500 mg total) by mouth 2 (two) times daily.     metoprolol succinate 50 MG 24 hr tablet  Commonly known as:  TOPROL-XL  Take 25 mg by mouth 2 (two) times daily. Take with or immediately following a meal.     NASACORT ALLERGY 24HR 55 MCG/ACT Aero nasal inhaler  Generic drug:  triamcinolone  Place 2 sprays into the nose daily.     nitroGLYCERIN 0.4 MG SL tablet  Commonly known as:  NITROSTAT  Place 1 tablet (0.4 mg total) under the tongue every 5 (five) minutes x 3 doses as needed for chest pain.     omeprazole 40 MG capsule  Commonly known as:  PRILOSEC  TAKE ONE CAPSULE BY MOUTH EVERY DAY     valsartan 320 MG tablet  Commonly known as:  DIOVAN  Take 0.5 tablets (160 mg total) by mouth daily. --- Needs office visit for further refills.           Follow-up Information    Follow up with Charolette Forward, MD. Schedule an appointment as soon as possible for a visit on 10/22/2015.   Specialty:  Cardiology   Why:  julie scheduled appt 10/22/15 at 2:30pm   Contact information:   [address] [place] [address] [phone]      The results of significant diagnostics from this hospitalization (including imaging, microbiology, ancillary and laboratory) are listed below for reference.    Significant Diagnostic Studies: Ct Angio Head W/cm &/or Wo Cm  10/18/2015  CLINICAL DATA:  82 year old hypertensive male with altered mental status and stuttering speech. Subsequent encounter. EXAM: CT ANGIOGRAPHY HEAD AND NECK TECHNIQUE:  Multidetector CT imaging of the head and neck was performed using the standard protocol during bolus administration of intravenous contrast. Multiplanar CT image reconstructions and MIPs were obtained to evaluate the vascular anatomy. Carotid stenosis measurements (when applicable) are obtained utilizing NASCET criteria, using the distal internal carotid diameter as the denominator. CONTRAST:  61mL OMNIPAQUE IOHEXOL 350 MG/ML SOLN COMPARISON:  10/17/2015 MR FINDINGS: CT HEAD Brain: Prominent chronic small vessel disease changes. Remote right frontal lobe infarct with encephalomalacia. No intracranial hemorrhage. No intracranial mass or abnormal enhancement. Global atrophy without hydrocephalus. Calvarium and skull base: Negative. Paranasal sinuses: Retention cyst left maxillary sinus. Orbits: Post lens replacement. CTA NECK Aortic arch: 3 vessel arch with calcified plaque. Right carotid system: Plaque carotid bifurcation with 46% diameter stenosis proximal right internal carotid artery. Left carotid system: Plaque left carotid bifurcation with 48% diameter stenosis proximal left internal carotid artery. Vertebral arteries:Mild narrowing origin of vertebral arteries bilaterally. Left vertebral  artery is dominant size. Mild plaque with mild narrowing and irregularity subclavian artery bilaterally. Skeleton: Cervical spondylotic changes most notable C5-6 and C6-7. Other neck: No worrisome mass. CTA HEAD Anterior circulation: Calcified plaque cavernous segment internal carotid artery bilaterally with mild to slightly moderate narrowing but without high-grade stenosis. Aplastic A1 segment right anterior cerebral artery. Mild to moderate narrowing M1 segment left middle cerebral artery in diffuse fashion. Mild moderate narrowing right middle cerebral artery bifurcation. Moderate middle cerebral artery branch vessel irregularity narrowing bilaterally. Posterior circulation: Left vertebral artery is dominant. Mild moderate  narrowing distal right vertebral artery mild narrowing distal left vertebral artery. Mild irregularity and narrowing of the basilar artery without high-grade stenosis. Moderate tandem stenosis posterior cerebral artery proximal and distal branches. Venous sinuses: Patent. Anatomic variants: As above. Delayed phase: As above. IMPRESSION: CT HEAD Prominent chronic small vessel disease changes. Remote right frontal lobe infarct with encephalomalacia. Global atrophy without hydrocephalus. CTA NECK 46% diameter stenosis proximal right internal carotid artery. 48% diameter stenosis proximal left internal carotid artery. Mild narrowing origin of vertebral arteries bilaterally. Left vertebral artery is dominant. CTA HEAD Calcified plaque cavernous segment internal carotid artery bilaterally with mild to slightly moderate narrowing but without high-grade stenosis. Aplastic A1 segment right anterior cerebral artery. Mild to moderate narrowing M1 segment left middle cerebral artery in diffuse fashion. Mild to moderate narrowing right middle cerebral artery bifurcation. Moderate middle cerebral artery branch vessel irregularity and narrowing bilaterally. Mild to moderate narrowing distal right vertebral artery and mild narrowing distal left vertebral artery. Mild irregularity and narrowing of the basilar artery without high-grade stenosis. Moderate tandem stenosis posterior cerebral artery proximal and distal branches. Electronically Signed   By: Genia Del M.D.   On: 10/18/2015 16:17   Dg Chest 1 View  10/18/2015  CLINICAL DATA:  82 year old male with infection and sepsis. EXAM: CHEST 1 VIEW COMPARISON:  Radiograph dated 09/13/2015 and CT dated 11/06/2013 FINDINGS: Single-view of the chest does not demonstrate a focal consolidation. Minimal linear and platelike atelectatic changes noted at the right lung base. There is slight blunting of the left costophrenic angle which may be related to underlying subsegmental  atelectasis/ scarring. A small left pleural effusion is not excluded. Stable cardiac silhouette. No acute osseous pathology. IMPRESSION: No focal consolidation. Electronically Signed   By: Anner Crete M.D.   On: 10/18/2015 01:08   Ct Head Wo Contrast  10/17/2015  CLINICAL DATA:  Altered mental status, last seen normal at 2100 hours. History of hypertension, hyperlipidemia. EXAM: CT HEAD WITHOUT CONTRAST TECHNIQUE: Contiguous axial images were obtained from the base of the skull through the vertex without intravenous contrast. COMPARISON:  MRI head September 13, 2015 FINDINGS: Moderately motion degraded examination. The ventricles and sulci are normal for age. No intraparenchymal hemorrhage, mass effect nor midline shift. Patchy supratentorial white matter hypodensities are within normal range for patient's age and though non-specific suggest sequelae of chronic small vessel ischemic disease. No acute large vascular territory infarcts. RIGHT posterior frontal encephalomalacia. Old bilateral basal ganglia lacunar infarcts. No abnormal extra-axial fluid collections. Probable small RIGHT parafalcine arachnoid cyst. Basal cisterns are patent. Moderate calcific atherosclerosis of the carotid siphons. No skull fracture. The included ocular globes and orbital contents are non-suspicious. Status post bilateral ocular lens implants. LEFT maxillary mucosal retention cyst and mild paranasal sinus mucosal thickening. IMPRESSION: No acute intracranial process on this moderately motion degraded examination. Stable chronic changes including old RIGHT posterior frontal lobe infarct, basal ganglia lacunar infarcts and moderate chronic small  vessel ischemic disease. Acute findings discussed with and reconfirmed by Dr.Stewart, neurology on 10/17/2015 at 10:32 pm. Electronically Signed   By: Elon Alas M.D.   On: 10/17/2015 22:34   Ct Angio Neck W/cm &/or Wo/cm  10/18/2015  CLINICAL DATA:  82 year old hypertensive male  with altered mental status and stuttering speech. Subsequent encounter. EXAM: CT ANGIOGRAPHY HEAD AND NECK TECHNIQUE: Multidetector CT imaging of the head and neck was performed using the standard protocol during bolus administration of intravenous contrast. Multiplanar CT image reconstructions and MIPs were obtained to evaluate the vascular anatomy. Carotid stenosis measurements (when applicable) are obtained utilizing NASCET criteria, using the distal internal carotid diameter as the denominator. CONTRAST:  79mL OMNIPAQUE IOHEXOL 350 MG/ML SOLN COMPARISON:  10/17/2015 MR FINDINGS: CT HEAD Brain: Prominent chronic small vessel disease changes. Remote right frontal lobe infarct with encephalomalacia. No intracranial hemorrhage. No intracranial mass or abnormal enhancement. Global atrophy without hydrocephalus. Calvarium and skull base: Negative. Paranasal sinuses: Retention cyst left maxillary sinus. Orbits: Post lens replacement. CTA NECK Aortic arch: 3 vessel arch with calcified plaque. Right carotid system: Plaque carotid bifurcation with 46% diameter stenosis proximal right internal carotid artery. Left carotid system: Plaque left carotid bifurcation with 48% diameter stenosis proximal left internal carotid artery. Vertebral arteries:Mild narrowing origin of vertebral arteries bilaterally. Left vertebral artery is dominant size. Mild plaque with mild narrowing and irregularity subclavian artery bilaterally. Skeleton: Cervical spondylotic changes most notable C5-6 and C6-7. Other neck: No worrisome mass. CTA HEAD Anterior circulation: Calcified plaque cavernous segment internal carotid artery bilaterally with mild to slightly moderate narrowing but without high-grade stenosis. Aplastic A1 segment right anterior cerebral artery. Mild to moderate narrowing M1 segment left middle cerebral artery in diffuse fashion. Mild moderate narrowing right middle cerebral artery bifurcation. Moderate middle cerebral artery  branch vessel irregularity narrowing bilaterally. Posterior circulation: Left vertebral artery is dominant. Mild moderate narrowing distal right vertebral artery mild narrowing distal left vertebral artery. Mild irregularity and narrowing of the basilar artery without high-grade stenosis. Moderate tandem stenosis posterior cerebral artery proximal and distal branches. Venous sinuses: Patent. Anatomic variants: As above. Delayed phase: As above. IMPRESSION: CT HEAD Prominent chronic small vessel disease changes. Remote right frontal lobe infarct with encephalomalacia. Global atrophy without hydrocephalus. CTA NECK 46% diameter stenosis proximal right internal carotid artery. 48% diameter stenosis proximal left internal carotid artery. Mild narrowing origin of vertebral arteries bilaterally. Left vertebral artery is dominant. CTA HEAD Calcified plaque cavernous segment internal carotid artery bilaterally with mild to slightly moderate narrowing but without high-grade stenosis. Aplastic A1 segment right anterior cerebral artery. Mild to moderate narrowing M1 segment left middle cerebral artery in diffuse fashion. Mild to moderate narrowing right middle cerebral artery bifurcation. Moderate middle cerebral artery branch vessel irregularity and narrowing bilaterally. Mild to moderate narrowing distal right vertebral artery and mild narrowing distal left vertebral artery. Mild irregularity and narrowing of the basilar artery without high-grade stenosis. Moderate tandem stenosis posterior cerebral artery proximal and distal branches. Electronically Signed   By: Genia Del M.D.   On: 10/18/2015 16:17   Mr Brain Wo Contrast  10/18/2015  CLINICAL DATA:  Acute onset RIGHT facial droop, confusion and difficulty moving at 2100 hours today. Off Plavix for flank hematoma. History of hyperlipidemia, memory loss, bladder cancer, hypertension. EXAM: MRI HEAD WITHOUT CONTRAST TECHNIQUE: Multiplanar, multiecho pulse sequences of  the brain and surrounding structures were obtained without intravenous contrast. COMPARISON:  CT head October 17, 2015 at 2220 hours and MRI of the head  September 13, 2015 FINDINGS: Multiple sequences are mildly or moderately motion degraded. The ventricles and sulci are normal for patient's age. No abnormal parenchymal signal, mass lesions, mass effect. Smaller RIGHT frontal encephalomalacia. Patchy supratentorial and pontine white matter FLAIR T2 hyperintensities. Old RIGHT thalamus lacunar infarct, probable basal ganglia lacunar infarcts. No reduced diffusion to suggest acute ischemia. No susceptibility artifact to suggest hemorrhage. Similar symmetric prominent bifrontal extra-axial fluid spaces measuring up to 5 mm without mass effect could represent old subdural hematomas or, underlying parenchymal brain volume loss. No extra-axial masses though, contrast enhanced sequences would be more sensitive. RIGHT mesial frontal probable arachnoid cyst is unchanged. Normal major intracranial vascular flow voids seen at the skull base. Status post bilateral ocular lens implants. No abnormal sellar expansion. No suspicious calvarial bone marrow signal. Craniocervical junction maintained. Small LEFT maxillary mucosal retention cyst. IMPRESSION: No acute intracranial process, specifically no acute ischemia on this motion degraded examination. Chronic changes, including RIGHT frontal encephalomalacia which may represent old infarct or head injury, mild to moderate chronic small vessel ischemic disease. Electronically Signed   By: Elon Alas M.D.   On: 10/18/2015 00:08   US Scrotum  10/18/2015  CLINICAL DATA:  Acute onset of scrotal swelling.  Initial encounter. EXAM: SCROTAL ULTRASOUND DOPPLER ULTRASOUND OF THE TESTICLES TECHNIQUE: Complete ultrasound examination of the testicles, epididymis, and other scrotal structures was performed. Color and spectral Doppler ultrasound were also utilized to evaluate blood flow  to the testicles. COMPARISON:  CT of the abdomen and pelvis performed earlier today at 12:08 a.m. FINDINGS: Right testicle Measurements: 4.1 x 1.6 x 3.2 cm. No mass or microlithiasis visualized. Left testicle Measurements: 4.0 x 1.9 x 2.6 cm. No mass or microlithiasis visualized. Right epididymis:  Normal in size and appearance. Left epididymis:  Normal in size and appearance. Hydrocele:  Trace hydroceles remain within normal limits. Varicocele:  A patent left-sided varicocele is noted. Pulsed Doppler interrogation of both testes demonstrates normal low resistance arterial and venous waveforms bilaterally. IMPRESSION: 1. No evidence of testicular torsion. Testes unremarkable in appearance. 2. Patent left-sided varicocele noted. Electronically Signed   By: Garald Balding M.D.   On: 10/18/2015 05:01   Ct Abdomen Pelvis W Contrast  10/18/2015  CLINICAL DATA:  Acute onset of left testicular pain. Generalized abdominal guarding. Initial encounter. EXAM: CT ABDOMEN AND PELVIS WITH CONTRAST TECHNIQUE: Multidetector CT imaging of the abdomen and pelvis was performed using the standard protocol following bolus administration of intravenous contrast. CONTRAST:  174mL OMNIPAQUE IOHEXOL 300 MG/ML  SOLN COMPARISON:  CT of the abdomen and pelvis from 10/06/2015 FINDINGS: Trace left pleural fluid is noted, with mild bibasilar atelectasis. Diffuse coronary artery calcifications are seen. A tiny hiatal hernia is noted. The left lateral abdominal wall and retroperitoneal hematoma has decreased mildly in size from the prior study, measuring approximately 13.2 x 8.8 x 4.6 cm, with a small amount of additional blood extending inferiorly along the left inguinal canal. The hematoma is somewhat better organized than on the prior study. The liver and spleen are unremarkable in appearance. The patient is status post cholecystectomy, with clips noted at the gallbladder fossa. The pancreas and adrenal glands are unremarkable. Scattered  bilateral renal cysts are noted, more prominent on the right. Nonspecific perinephric stranding is noted bilaterally. There is no evidence of hydronephrosis. No renal or ureteral stones are identified. No free fluid is identified. The small bowel is unremarkable in appearance. The stomach is within normal limits. No acute vascular abnormalities are seen. Scattered  calcification is noted along the abdominal aorta and its branches. There is mild aneurysmal dilatation of the infrarenal abdominal aorta, with several small outpouchings. A small right posterior outpouching is noted both proximally, measuring 3.4 cm in maximal diameter. There is then mild fusiform dilatation, measuring 3.2 cm in AP dimension and 3.3 cm in transverse dimension, followed by additional transverse then AP dilatation, measuring up to 4.0 cm in maximal transverse dimension. Mild associated mural thrombus is noted, without significant luminal narrowing. 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 The appendix is normal in caliber, without evidence of appendicitis. Scattered diverticulosis is noted along the descending and sigmoid colon, without evidence of diverticulitis. The bladder is mildly distended and grossly unremarkable. The prostate is mildly enlarged, measuring 5.0 cm in transverse dimension. Small bilateral inguinal hernias are seen, containing only fat. No inguinal lymphadenopathy is seen. No acute osseous abnormalities are identified. IMPRESSION: 1. Left lateral abdominal wall and retroperitoneal hematoma has decreased mildly in size from the prior study, measuring 13.2 x 8.8 x 4.6 cm, with a small amount of additional blood tracking inferiorly along the left inguinal canal. The hematoma is somewhat better organized than on the prior study. This may correspond to the patient's symptoms. 2. Mild aneurysmal dilatation of the infrarenal abdominal aorta, with several  outpouchings. The abdominal aorta measures at most 4.0 cm in maximal transverse dimension. 3.3 cm focal aneurysm also noted along the proximal right common iliac artery. Mild associated mural thrombus noted. Recommend followup by ultrasound in 1 year. This recommendation follows ACR consensus guidelines: White Paper of the ACR Incidental Findings Committee II on Vascular Findings. J Am Coll Radiol 2013; 10:789-794. 3. Diffuse coronary artery calcifications seen. 4. Trace left pleural fluid, with mild bibasilar atelectasis. 5. Tiny hiatal hernia seen. 6. Scattered bilateral renal cysts, more prominent on the right. 7. Scattered calcification along the abdominal aorta and its branches. 8. Scattered diverticulosis along the descending and sigmoid colon, without evidence of diverticulitis. 9. Mildly enlarged prostate noted. 10. Small bilateral inguinal hernias, containing only fat. Electronically Signed   By: Garald Balding M.D.   On: 10/18/2015 01:22   [place] Art/ven Flow Abd Pelv Doppler  10/18/2015  CLINICAL DATA:  Acute onset of scrotal swelling.  Initial encounter. EXAM: SCROTAL ULTRASOUND DOPPLER ULTRASOUND OF THE TESTICLES TECHNIQUE: Complete ultrasound examination of the testicles, epididymis, and other scrotal structures was performed. Color and spectral Doppler ultrasound were also utilized to evaluate blood flow to the testicles. COMPARISON:  CT of the abdomen and pelvis performed earlier today at 12:08 a.m. FINDINGS: Right testicle Measurements: 4.1 x 1.6 x 3.2 cm. No mass or microlithiasis visualized. Left testicle Measurements: 4.0 x 1.9 x 2.6 cm. No mass or microlithiasis visualized. Right epididymis:  Normal in size and appearance. Left epididymis:  Normal in size and appearance. Hydrocele:  Trace hydroceles remain within normal limits. Varicocele:  A patent left-sided varicocele is noted. Pulsed Doppler interrogation of both testes demonstrates normal low resistance arterial and venous waveforms  bilaterally. IMPRESSION: 1. No evidence of testicular torsion. Testes unremarkable in appearance. 2. Patent left-sided varicocele noted. Electronically Signed   By: Garald Balding M.D.   On: 10/18/2015 05:01   Ct Cta Abd/pel W/cm &/or W/o Cm  10/06/2015  CLINICAL DATA:  Left lower quadrant pain since 1600 hours. Constipation. Dark stools. Elevated white cell count. Elevated lactate. EXAM: CTA ABDOMEN AND PELVIS wITHOUT AND WITH CONTRAST TECHNIQUE: Multidetector CT imaging  of the abdomen and pelvis was performed using the standard protocol during bolus administration of intravenous contrast. Multiplanar reconstructed images and MIPs were obtained and reviewed to evaluate the vascular anatomy. CONTRAST:  100 mL Omnipaque 350 COMPARISON:  04/09/2014 FINDINGS: Fibrosis in the lung bases.  Coronary artery calcifications. Images obtained of the abdomen and pelvis during arterial phase of contrast bolus enhancement demonstrate small focal aneurysm of the abdominal aorta with mild calcification and mural thrombus inferiorly. Maximal AP diameter is 3.2 cm. 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 No aortic dissection. The abdominal aorta, celiac axis, superior mesenteric artery, single right and duplicated left renal arteries, inferior mesenteric artery, and bilateral iliac, external iliac, internal iliac, and common femoral arteries are patent. Focal stenosis is demonstrated at the origin of the celiac axis representing 50% diameter reduction. Renal nephrograms are symmetrical. The liver, spleen, pancreas, adrenal glands, inferior vena cava, and retroperitoneal lymph nodes are unremarkable. Surgical absence of the gallbladder. No bile duct dilatation. Multiple cysts in both kidneys. No hydronephrosis. Small esophageal hiatal hernia. Stomach, small bowel, and colon are not abnormally distended. Contrast material flows through to the cecum without evidence of bowel  obstruction. No bowel wall thickening. Scattered stool in the colon. No free air or free fluid in the abdomen. Pelvis: There is a left iliopsoas hematoma extending to involve the left flank muscles and measuring about 8.8 x 15.6 cm. No active contrast extravasation is noted. Diverticulosis of the sigmoid colon without evidence of diverticulitis. Bladder wall is not thickened. Prostate gland is mildly enlarged, measuring 5.2 cm diameter. Appendix is not identified. Fat in the inguinal canals bilaterally. Diffuse degenerative change throughout the lumbar spine. No destructive bone lesions. No acute displaced fractures identified. Review of the MIP images confirms the above findings. IMPRESSION: 3.2 cm abdominal aortic aneurysm. Aorta and major branch vessels are patent. Atherosclerotic changes. 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 Narrowing of the origin of the celiac axis. Large left iliopsoas and left flank hematoma. Electronically Signed   By: Lucienne Capers M.D.   On: 10/06/2015 01:17   Microbiology: Recent Results (from the past 240 hour(s))  Urine culture     Status: None   Collection Time: 10/17/15 11:11 PM  Result Value Ref Range Status   Specimen Description URINE, RANDOM  Final   Special Requests ADDED 0238 10/18/15  Final   Culture NO GROWTH 1 DAY  Final   Report Status 10/19/2015 FINAL  Final  Blood Culture (routine x 2)     Status: None (Preliminary result)   Collection Time: 10/18/15 12:20 AM  Result Value Ref Range Status   Specimen Description BLOOD LEFT ANTECUBITAL  Final   Special Requests BOTTLES DRAWN AEROBIC AND ANAEROBIC 10CC   Final   Culture NO GROWTH 1 DAY  Final   Report Status PENDING  Incomplete  Blood Culture (routine x 2)     Status: None (Preliminary result)   Collection Time: 10/18/15 12:30 AM  Result Value Ref Range Status   Specimen Description BLOOD RIGHT ANTECUBITAL  Final   Special Requests BOTTLES DRAWN AEROBIC ONLY 10CC  Final   Culture  NO GROWTH 1 DAY  Final   Report Status PENDING  Incomplete    Labs: Basic Metabolic Panel:  Recent Labs Lab 10/17/15 2210 10/17/15 2226  NA 138 137  K 3.9 3.6  CL 102 98*  CO2 24  --   GLUCOSE 127* 127*  BUN 10  12  CREATININE 1.08 1.00  CALCIUM 9.1  --    Liver Function Tests:  Recent Labs Lab 10/17/15 2210  AST 20  ALT 12*  ALKPHOS 57  BILITOT 1.1  PROT 6.1*  ALBUMIN 3.3*    Recent Labs Lab 10/18/15 0256  AMMONIA 15   CBC:  Recent Labs Lab 10/17/15 2210 10/17/15 2226  WBC 8.4  --   NEUTROABS 4.9  --   HGB 12.7* 13.3  HCT 38.8* 39.0  MCV 95.6  --   PLT 253  --    CBG:  Recent Labs Lab 10/17/15 2234  GLUCAP 131*   Signed:  Mandy Fitzwater  [facility] 10/19/2015, 3:39 PM

## 2015-10-19 NOTE — Progress Notes (Signed)
Patient is discharged from room 5M10 at this time. Alert and in stable condition. IV site d/c'd as well as tele. Instructions read to patient and wife with understanding verbalized. Left unit via wheelchair with all belongings at side.

## 2015-10-19 NOTE — Progress Notes (Signed)
MD on call notified that pt wife is requesting medication for sleep. New order received. Arthor Captain LPN

## 2015-10-19 NOTE — Progress Notes (Signed)
PT Cancellation Note  Patient Details Name: [name] MRN: [id number] DOB: 08-Feb-1926   Cancelled Treatment:    Reason Eval/Treat Not Completed: Patient declined, no reason specified   Noted MD has written for discharge. RN felt pt might benefit from PT evaluation. Patient and wife present and state he has walked in the room and "seems to be doing just fine." Patient reported he has fallen 5-6 times in past year "due to old age" or "different reasons." (Wife did not believe he has fallen this much). Attempted to discuss falls and whether he could benefit from PT, however pt would no longer make eye contact, is focused only on going home, and wife reports they only have 2 steps into home and "will be fine."     Chanice Brenton 10/19/2015, 1:27 PM Pager [phone]

## 2015-10-23 LAB — CULTURE, BLOOD (ROUTINE X 2)
CULTURE: NO GROWTH
Culture: NO GROWTH

## 2016-01-31 ENCOUNTER — Other Ambulatory Visit: Payer: Self-pay | Admitting: Physician Assistant

## 2016-04-12 IMAGING — US US ART/VEN ABD/PELV/SCROTUM DOPPLER LTD
1 series · 14 of 25 positions shown · non-contrast
Comparison: CT of the abdomen and pelvis performed earlier today at

CLINICAL DATA: Acute onset of scrotal swelling.  Initial encounter.

EXAM:
SCROTAL ULTRASOUND
DOPPLER ULTRASOUND OF THE TESTICLES
TECHNIQUE: Complete ultrasound examination of the testicles, epididymis, and
other scrotal structures was performed. Color and spectral Doppler
ultrasound were also utilized to evaluate blood flow to the
testicles.

[Series 1: us art/ven abd/pelv/scrotum doppler ltd · 0.07mm/px · 14 of 34 slices shown]
[im 1/34]
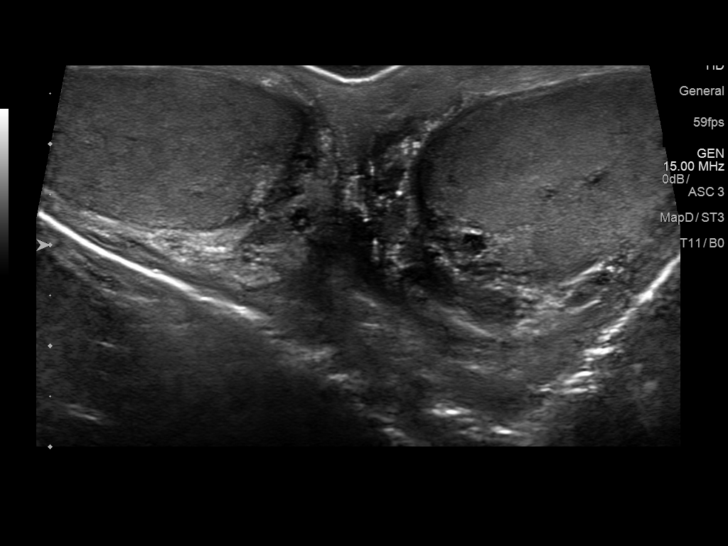
[im 3/34]
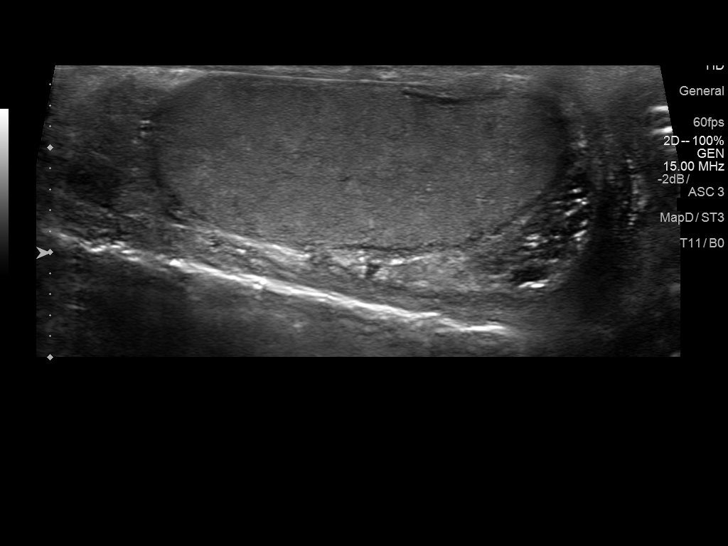
[im 6/34]
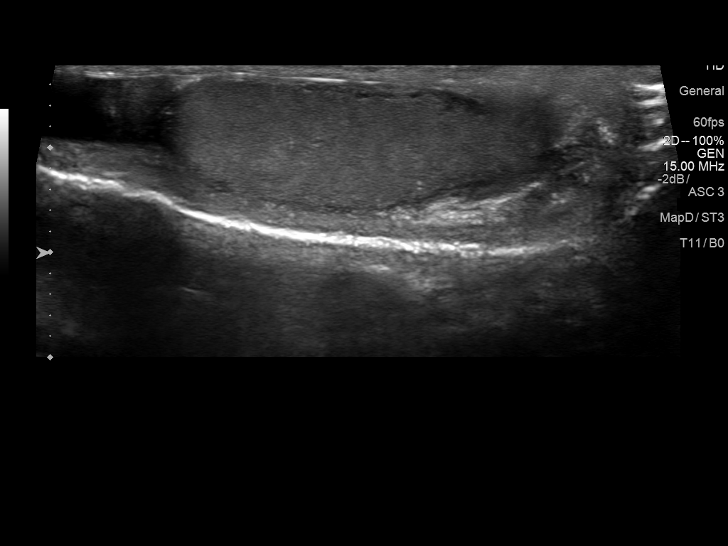
[im 9/34]
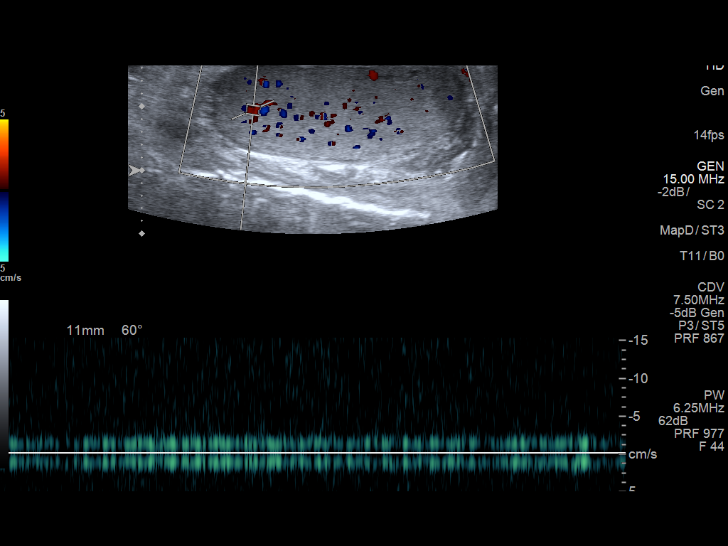
[im 12/34]
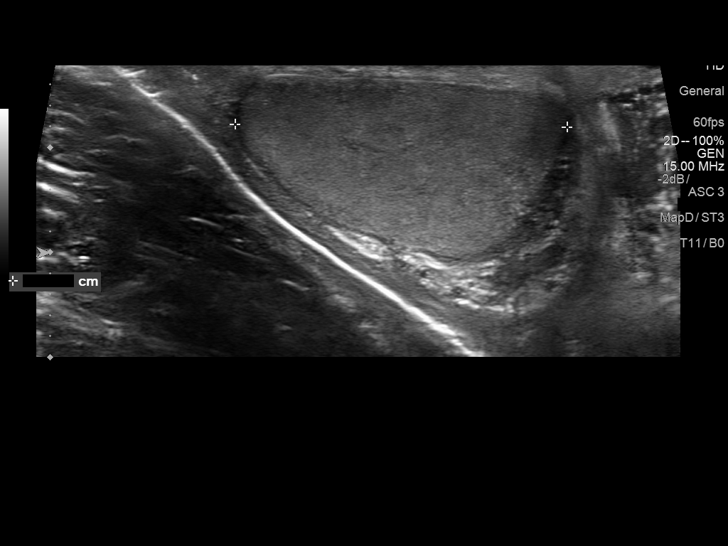
[im 13/34]
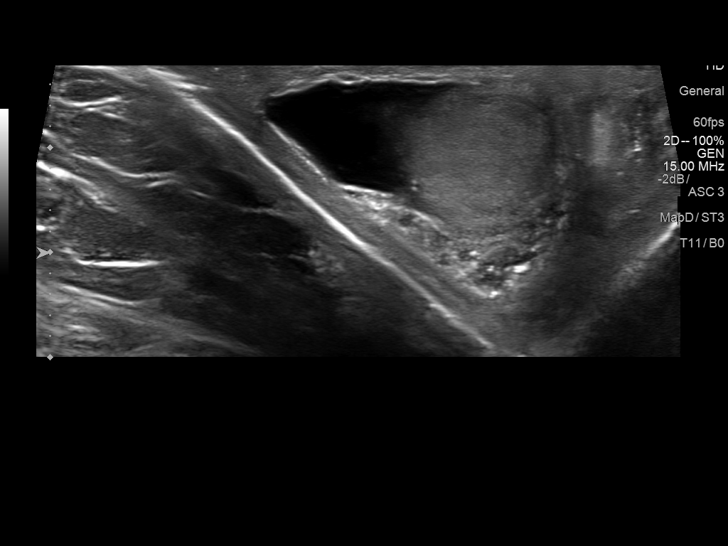
[im 16/34]
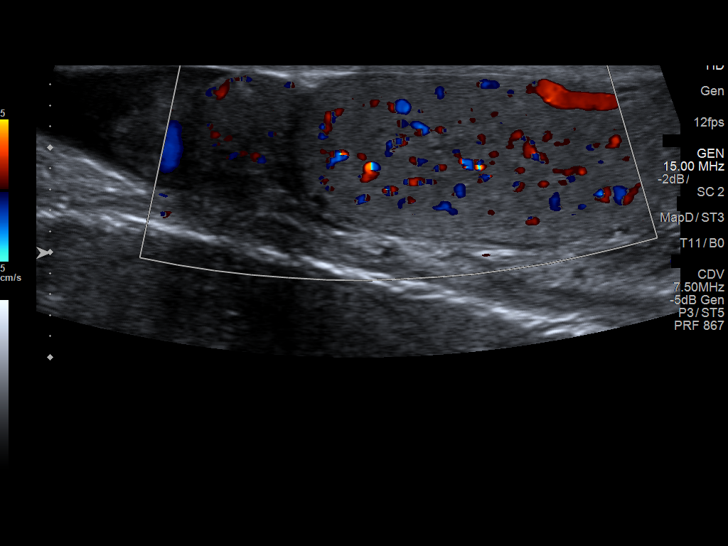
[im 18/34]
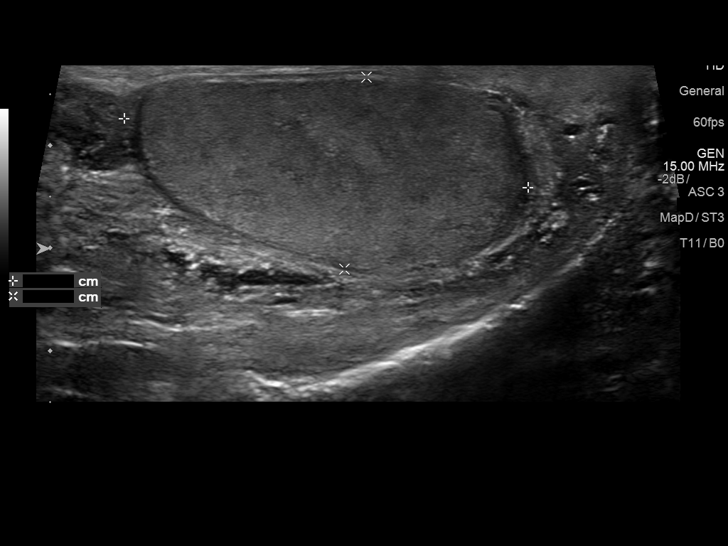
[im 21/34]
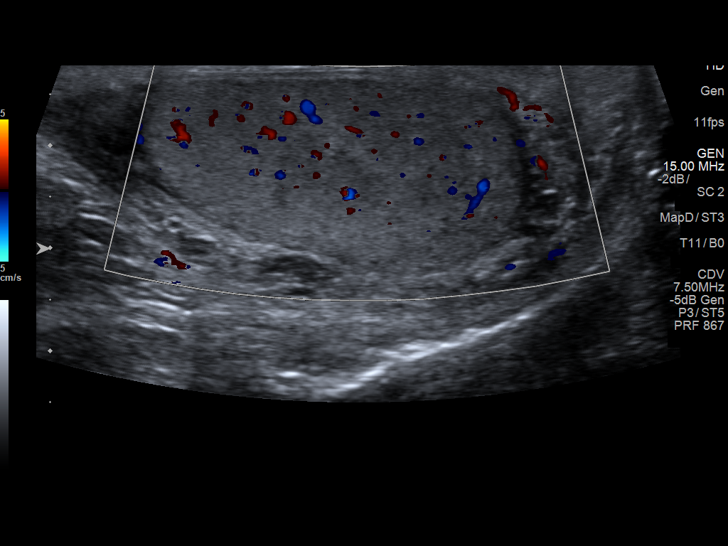
[im 23/34]
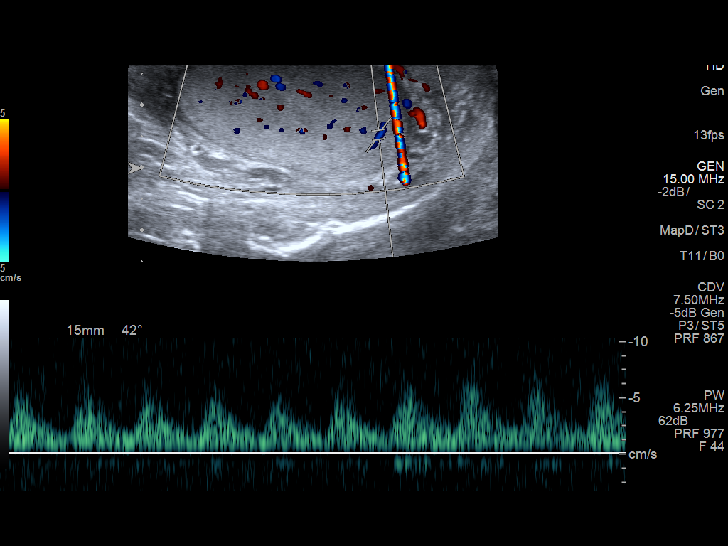
[im 25/34]
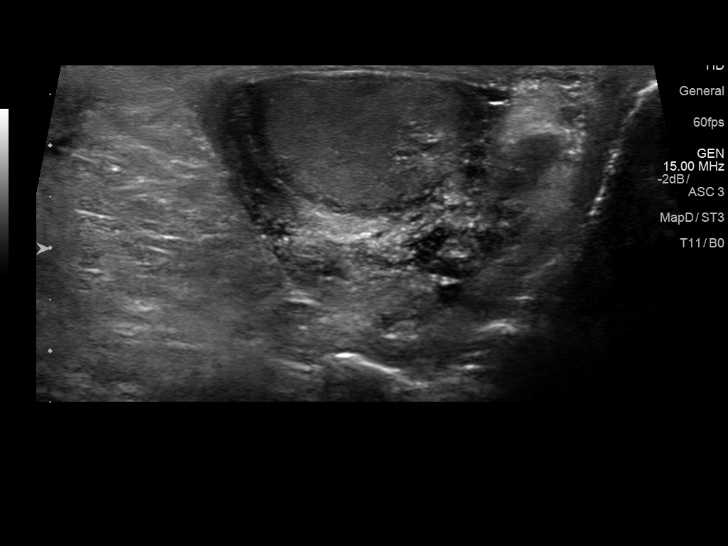
[im 28/34]
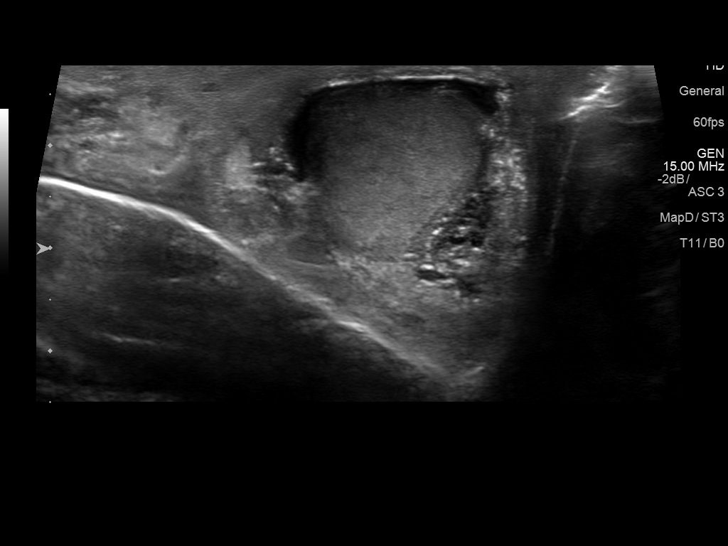
[im 31/34]
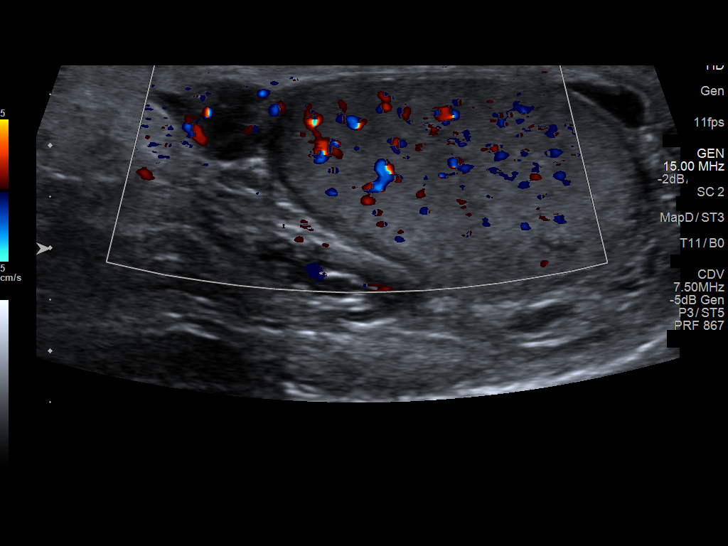
[im 34/34]
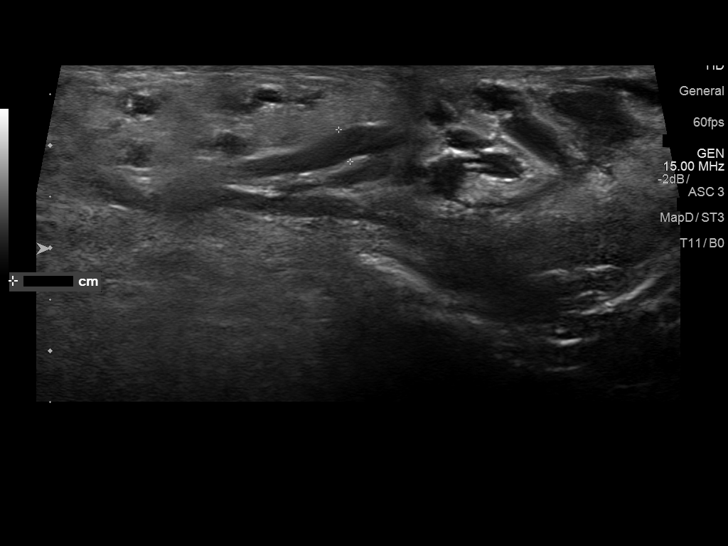

[14 of 25 positions shown; findings below may reference images not displayed]

FINDINGS: Right testicle

Measurements: 4.1 x 1.6 x 3.2 cm. No mass or microlithiasis
visualized.

Left testicle

Measurements: 4.0 x 1.9 x 2.6 cm. No mass or microlithiasis
visualized.

Right epididymis:  Normal in size and appearance.

Left epididymis:  Normal in size and appearance.

Hydrocele:  Trace hydroceles remain within normal limits.

Varicocele:  A patent left-sided varicocele is noted.

Pulsed Doppler interrogation of both testes demonstrates normal low
resistance arterial and venous waveforms bilaterally.
IMPRESSION: 1. No evidence of testicular torsion. Testes unremarkable in
appearance.
2. Patent left-sided varicocele noted.

## 2016-10-02 IMAGING — CT CT ABD-PELV W/ CM
2 of 5 series · 14 of 46 positions shown, 16 images · IV contrast (Omni 300)
Comparison: CT of the abdomen and pelvis from 10/06/2015

CLINICAL DATA: Acute onset of left testicular pain. Generalized
abdominal guarding. Initial encounter.

EXAM:
CT ABDOMEN AND PELVIS WITH CONTRAST
TECHNIQUE: Multidetector CT imaging of the abdomen and pelvis was performed
using the standard protocol following bolus administration of
intravenous contrast.
CONTRAST:  100mL OMNIPAQUE IOHEXOL 300 MG/ML  SOLN

[Series 2: a/p w/ 5mm · axial · 0.84mm/px · z∈[-476,-11]mm · 11 of 105 slices shown, 13 images]
[im 6/105  soft-tissue]
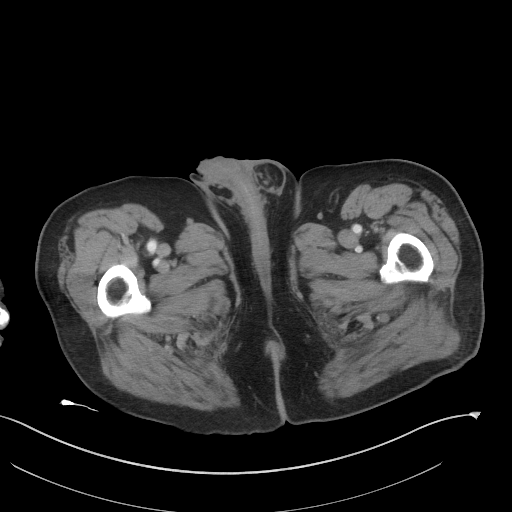
[im 6/105  bone]
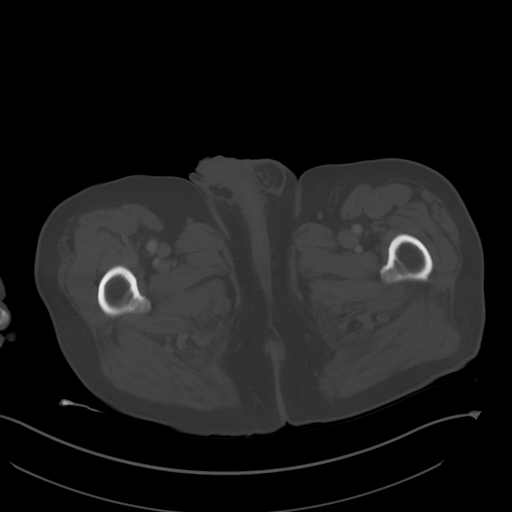
[im 17/105  soft-tissue]
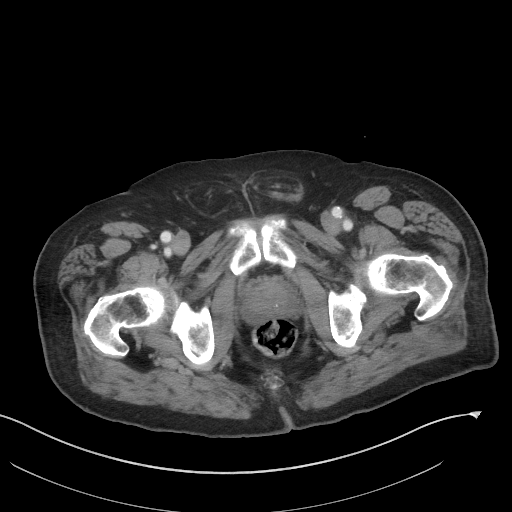
[im 28/105  soft-tissue]
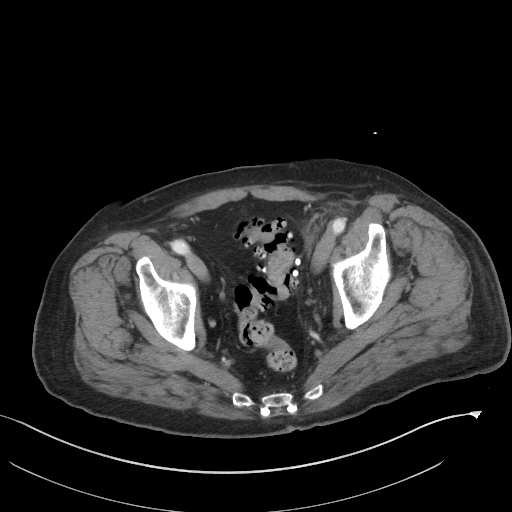
[im 33/105  soft-tissue]
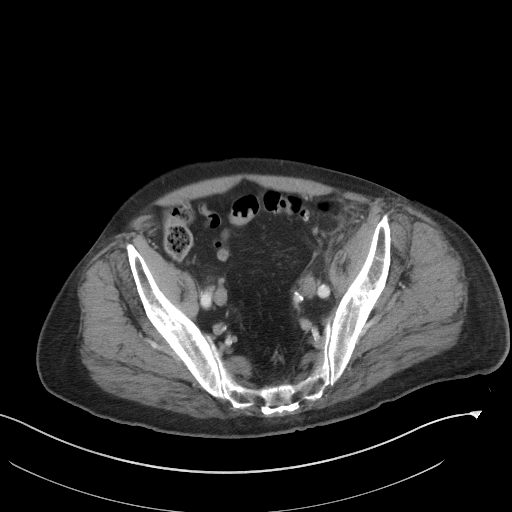
[im 44/105  soft-tissue]
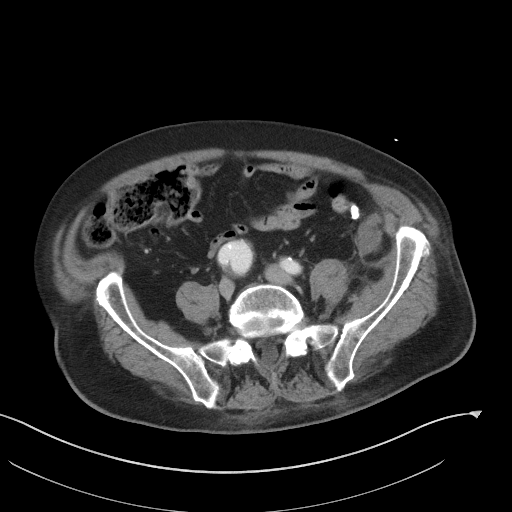
[im 55/105  soft-tissue]
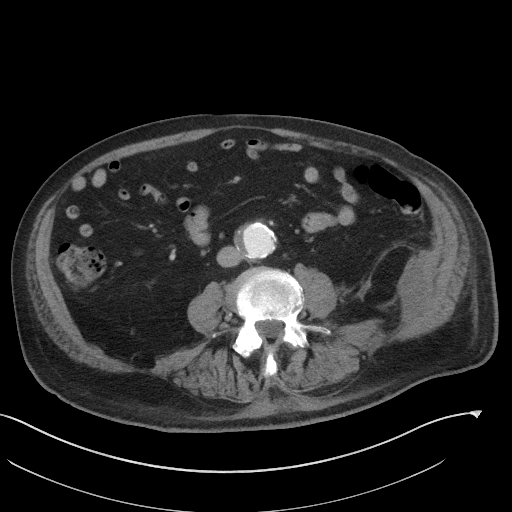
[im 61/105  soft-tissue]
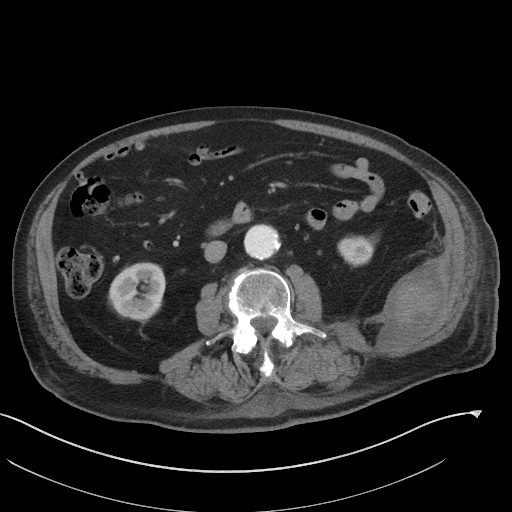
[im 72/105  soft-tissue]
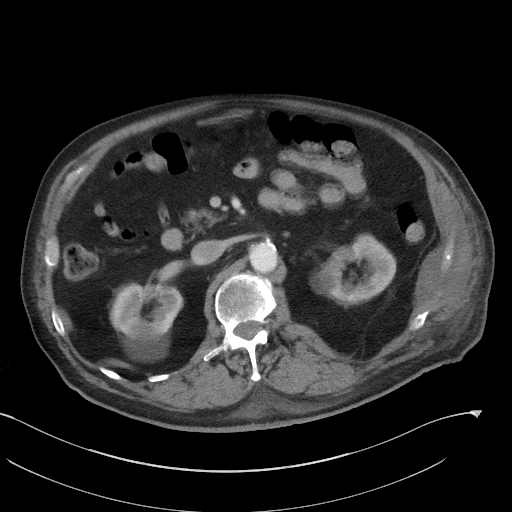
[im 77/105  soft-tissue]
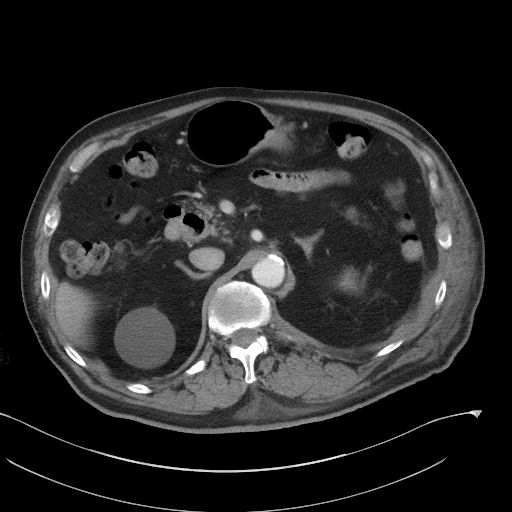
[im 77/105  bone]
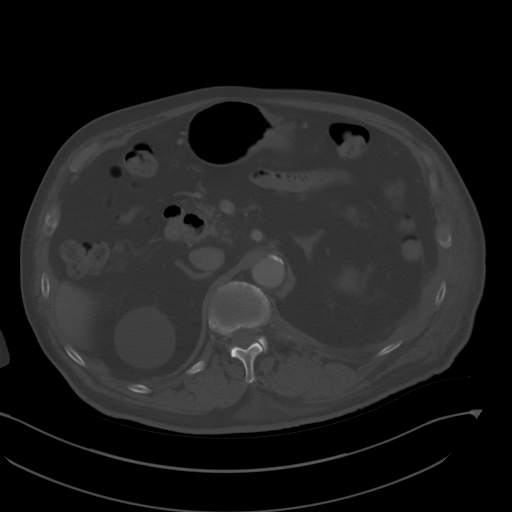
[im 88/105  soft-tissue]
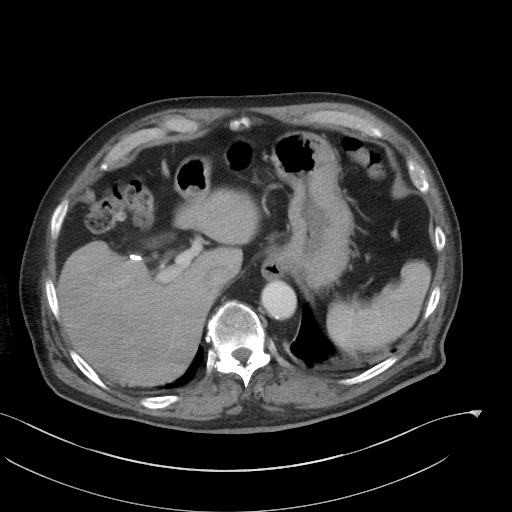
[im 99/105  soft-tissue]
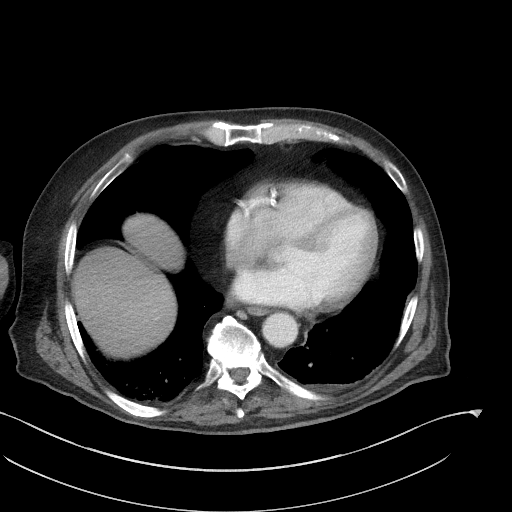

[Series 5: a/p w/ cor · coronal · 0.85mm/px · 3 of 141 slices shown]
[im 47/141  soft-tissue]
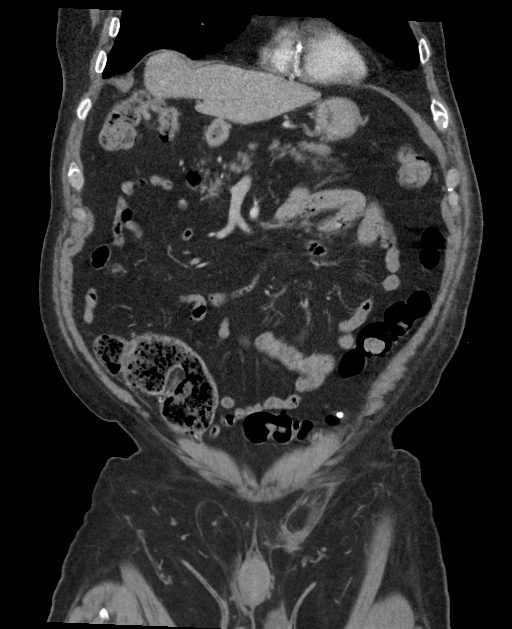
[im 63/141  soft-tissue]
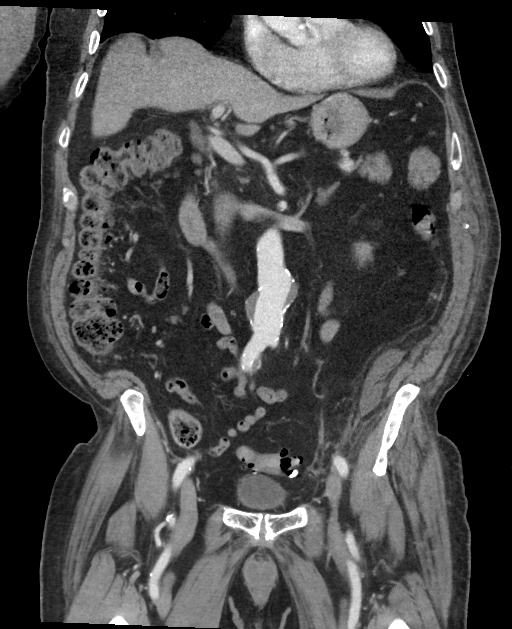
[im 78/141  soft-tissue]
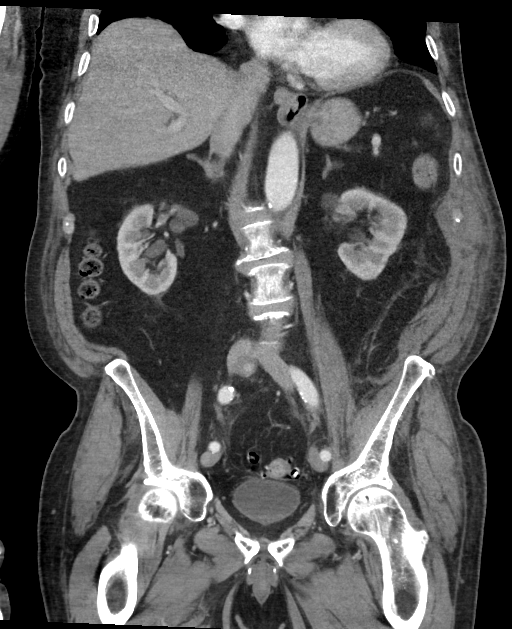

[14 of 46 positions shown; findings below may reference images not displayed]

FINDINGS: Trace left pleural fluid is noted, with mild bibasilar atelectasis.
Diffuse coronary artery calcifications are seen. A tiny hiatal
hernia is noted.

The left lateral abdominal wall and retroperitoneal hematoma has
decreased mildly in size from the prior study, measuring
approximately 13.2 x 8.8 x 4.6 cm, with a small amount of additional
blood extending inferiorly along the left inguinal canal. The
hematoma is somewhat better organized than on the prior study.

The liver and spleen are unremarkable in appearance. The patient is
status post cholecystectomy, with clips noted at the gallbladder
fossa. The pancreas and adrenal glands are unremarkable.

Scattered bilateral renal cysts are noted, more prominent on the
right. Nonspecific perinephric stranding is noted bilaterally. There
is no evidence of hydronephrosis. No renal or ureteral stones are
identified.

No free fluid is identified. The small bowel is unremarkable in
appearance. The stomach is within normal limits. No acute vascular
abnormalities are seen. Scattered calcification is noted along the
abdominal aorta and its branches.

There is mild aneurysmal dilatation of the infrarenal abdominal
aorta, with several small outpouchings. A small right posterior
outpouching is noted both proximally, measuring 3.4 cm in maximal
diameter. There is then mild fusiform dilatation, measuring 3.2 cm
in AP dimension and 3.3 cm in transverse dimension, followed by
additional transverse then AP dilatation, measuring up to 4.0 cm in
maximal transverse dimension. Mild associated mural thrombus is
noted, without significant luminal narrowing.

There is also a 3.3 cm focal aneurysm along the proximal right
common iliac artery, with mild mural thrombus.

The appendix is normal in caliber, without evidence of appendicitis.
Scattered diverticulosis is noted along the descending and sigmoid
colon, without evidence of diverticulitis.

The bladder is mildly distended and grossly unremarkable. The
prostate is mildly enlarged, measuring 5.0 cm in transverse
dimension. Small bilateral inguinal hernias are seen, containing
only fat. No inguinal lymphadenopathy is seen.

No acute osseous abnormalities are identified.
IMPRESSION: 1. Left lateral abdominal wall and retroperitoneal hematoma has
decreased mildly in size from the prior study, measuring 13.2 x
x 4.6 cm, with a small amount of additional blood tracking
inferiorly along the left inguinal canal. The hematoma is somewhat
better organized than on the prior study. This may correspond to the
patient's symptoms.
2. Mild aneurysmal dilatation of the infrarenal abdominal aorta,
with several outpouchings. The abdominal aorta measures at most
cm in maximal transverse dimension. 3.3 cm focal aneurysm also noted
along the proximal right common iliac artery. Mild associated mural
thrombus noted. Recommend followup by ultrasound in 1 year. This
recommendation follows ACR consensus guidelines: White Paper of the
ACR Incidental Findings Committee II on Vascular Findings. [HOSPITAL] 4826; [DATE].
3. Diffuse coronary artery calcifications seen.
4. Trace left pleural fluid, with mild bibasilar atelectasis.
5. Tiny hiatal hernia seen.
6. Scattered bilateral renal cysts, more prominent on the right.
7. Scattered calcification along the abdominal aorta and its
branches.
8. Scattered diverticulosis along the descending and sigmoid colon,
without evidence of diverticulitis.
9. Mildly enlarged prostate noted.
10. Small bilateral inguinal hernias, containing only fat.

## 2017-01-18 ENCOUNTER — Other Ambulatory Visit: Payer: Self-pay | Admitting: Physician Assistant

## 2017-01-18 ENCOUNTER — Telehealth: Payer: Self-pay | Admitting: *Deleted

## 2017-01-18 NOTE — Telephone Encounter (Signed)
Got a refill request for Prilosec 40 mg, 1 tab daily.  This patient has not been seen since 2015, with Amy Esterwood PA. He is a former Dr. Deatra Ina patient.  He needs an appointment before further refills. He can get over the counter Prilosec in the mean time.

## 2017-11-07 ENCOUNTER — Emergency Department (HOSPITAL_COMMUNITY): Payer: Medicare Other

## 2017-11-07 ENCOUNTER — Encounter (HOSPITAL_COMMUNITY): Payer: Self-pay | Admitting: Emergency Medicine

## 2017-11-07 ENCOUNTER — Encounter (HOSPITAL_COMMUNITY): Payer: Self-pay | Admitting: Anesthesiology

## 2017-11-07 ENCOUNTER — Inpatient Hospital Stay (HOSPITAL_COMMUNITY)
Admission: EM | Admit: 2017-11-07 | Discharge: 2017-11-12 | DRG: 470 | Disposition: A | Discharge status: Home Health | Payer: Medicare Other | Attending: Internal Medicine | Admitting: Internal Medicine

## 2017-11-07 ENCOUNTER — Other Ambulatory Visit: Payer: Self-pay

## 2017-11-07 DIAGNOSIS — F919 Conduct disorder, unspecified: Secondary | ICD-10-CM | POA: Diagnosis not present

## 2017-11-07 DIAGNOSIS — K219 Gastro-esophageal reflux disease without esophagitis: Secondary | ICD-10-CM | POA: Diagnosis present

## 2017-11-07 DIAGNOSIS — W19XXXA Unspecified fall, initial encounter: Secondary | ICD-10-CM | POA: Diagnosis not present

## 2017-11-07 DIAGNOSIS — Z87891 Personal history of nicotine dependence: Secondary | ICD-10-CM | POA: Diagnosis not present

## 2017-11-07 DIAGNOSIS — N179 Acute kidney failure, unspecified: Secondary | ICD-10-CM | POA: Diagnosis present

## 2017-11-07 DIAGNOSIS — Z7982 Long term (current) use of aspirin: Secondary | ICD-10-CM | POA: Diagnosis not present

## 2017-11-07 DIAGNOSIS — D696 Thrombocytopenia, unspecified: Secondary | ICD-10-CM | POA: Diagnosis present

## 2017-11-07 DIAGNOSIS — R402414 Glasgow coma scale score 13-15, 24 hours or more after hospital admission: Secondary | ICD-10-CM | POA: Diagnosis not present

## 2017-11-07 DIAGNOSIS — Z885 Allergy status to narcotic agent status: Secondary | ICD-10-CM | POA: Diagnosis not present

## 2017-11-07 DIAGNOSIS — R0989 Other specified symptoms and signs involving the circulatory and respiratory systems: Secondary | ICD-10-CM | POA: Diagnosis not present

## 2017-11-07 DIAGNOSIS — I252 Old myocardial infarction: Secondary | ICD-10-CM

## 2017-11-07 DIAGNOSIS — Z7951 Long term (current) use of inhaled steroids: Secondary | ICD-10-CM

## 2017-11-07 DIAGNOSIS — I714 Abdominal aortic aneurysm, without rupture: Secondary | ICD-10-CM | POA: Diagnosis present

## 2017-11-07 DIAGNOSIS — I10 Essential (primary) hypertension: Secondary | ICD-10-CM

## 2017-11-07 DIAGNOSIS — S72002A Fracture of unspecified part of neck of left femur, initial encounter for closed fracture: Secondary | ICD-10-CM | POA: Diagnosis present

## 2017-11-07 DIAGNOSIS — Y92000 Kitchen of unspecified non-institutional (private) residence as  the place of occurrence of the external cause: Secondary | ICD-10-CM | POA: Diagnosis not present

## 2017-11-07 DIAGNOSIS — Z419 Encounter for procedure for purposes other than remedying health state, unspecified: Secondary | ICD-10-CM

## 2017-11-07 DIAGNOSIS — E785 Hyperlipidemia, unspecified: Secondary | ICD-10-CM | POA: Diagnosis not present

## 2017-11-07 DIAGNOSIS — W1830XA Fall on same level, unspecified, initial encounter: Secondary | ICD-10-CM | POA: Diagnosis not present

## 2017-11-07 DIAGNOSIS — R52 Pain, unspecified: Secondary | ICD-10-CM

## 2017-11-07 DIAGNOSIS — Z96649 Presence of unspecified artificial hip joint: Secondary | ICD-10-CM

## 2017-11-07 DIAGNOSIS — E1159 Type 2 diabetes mellitus with other circulatory complications: Secondary | ICD-10-CM | POA: Diagnosis not present

## 2017-11-07 DIAGNOSIS — Z955 Presence of coronary angioplasty implant and graft: Secondary | ICD-10-CM

## 2017-11-07 DIAGNOSIS — F0390 Unspecified dementia without behavioral disturbance: Secondary | ICD-10-CM | POA: Diagnosis present

## 2017-11-07 DIAGNOSIS — F0391 Unspecified dementia with behavioral disturbance: Secondary | ICD-10-CM | POA: Diagnosis not present

## 2017-11-07 DIAGNOSIS — I110 Hypertensive heart disease with heart failure: Secondary | ICD-10-CM | POA: Diagnosis present

## 2017-11-07 DIAGNOSIS — S72002D Fracture of unspecified part of neck of left femur, subsequent encounter for closed fracture with routine healing: Secondary | ICD-10-CM | POA: Diagnosis not present

## 2017-11-07 DIAGNOSIS — D62 Acute posthemorrhagic anemia: Secondary | ICD-10-CM | POA: Diagnosis not present

## 2017-11-07 DIAGNOSIS — I519 Heart disease, unspecified: Secondary | ICD-10-CM | POA: Diagnosis present

## 2017-11-07 DIAGNOSIS — R0902 Hypoxemia: Secondary | ICD-10-CM

## 2017-11-07 DIAGNOSIS — I2510 Atherosclerotic heart disease of native coronary artery without angina pectoris: Secondary | ICD-10-CM | POA: Diagnosis present

## 2017-11-07 DIAGNOSIS — R7989 Other specified abnormal findings of blood chemistry: Secondary | ICD-10-CM | POA: Diagnosis not present

## 2017-11-07 DIAGNOSIS — C679 Malignant neoplasm of bladder, unspecified: Secondary | ICD-10-CM | POA: Diagnosis present

## 2017-11-07 DIAGNOSIS — Z79899 Other long term (current) drug therapy: Secondary | ICD-10-CM | POA: Diagnosis not present

## 2017-11-07 DIAGNOSIS — Z881 Allergy status to other antibiotic agents status: Secondary | ICD-10-CM

## 2017-11-07 DIAGNOSIS — S72009A Fracture of unspecified part of neck of unspecified femur, initial encounter for closed fracture: Secondary | ICD-10-CM | POA: Diagnosis present

## 2017-11-07 LAB — CBC WITH DIFFERENTIAL/PLATELET
BASOS ABS: 0 10*3/uL (ref 0.0–0.1)
BASOS PCT: 0 %
Eosinophils Absolute: 0.1 10*3/uL (ref 0.0–0.7)
Eosinophils Relative: 1 %
HEMATOCRIT: 51.3 % (ref 39.0–52.0)
HEMOGLOBIN: 16.7 g/dL (ref 13.0–17.0)
LYMPHS PCT: 12 %
Lymphs Abs: 1.2 10*3/uL (ref 0.7–4.0)
MCH: 31.9 pg (ref 26.0–34.0)
MCHC: 32.6 g/dL (ref 30.0–36.0)
MCV: 97.9 fL (ref 78.0–100.0)
Monocytes Absolute: 0.6 10*3/uL (ref 0.1–1.0)
Monocytes Relative: 6 %
NEUTROS ABS: 7.8 10*3/uL — AB (ref 1.7–7.7)
NEUTROS PCT: 81 %
Platelets: 146 10*3/uL — ABNORMAL LOW (ref 150–400)
RBC: 5.24 MIL/uL (ref 4.22–5.81)
RDW: 14 % (ref 11.5–15.5)
WBC: 9.6 10*3/uL (ref 4.0–10.5)

## 2017-11-07 LAB — BASIC METABOLIC PANEL
ANION GAP: 11 (ref 5–15)
BUN: 9 mg/dL (ref 6–20)
CALCIUM: 8.8 mg/dL — AB (ref 8.9–10.3)
CO2: 24 mmol/L (ref 22–32)
Chloride: 101 mmol/L (ref 101–111)
Creatinine, Ser: 0.97 mg/dL (ref 0.61–1.24)
Glucose, Bld: 99 mg/dL (ref 65–99)
POTASSIUM: 4.3 mmol/L (ref 3.5–5.1)
Sodium: 136 mmol/L (ref 135–145)

## 2017-11-07 LAB — TYPE AND SCREEN
ABO/RH(D): A POS
Antibody Screen: NEGATIVE

## 2017-11-07 LAB — PROTIME-INR
INR: 0.99
Prothrombin Time: 13 seconds (ref 11.4–15.2)

## 2017-11-07 MED ORDER — OXYCODONE HCL 5 MG PO TABS
5.0000 mg | ORAL_TABLET | ORAL | Status: DC | PRN
  Administered 2017-11-08: 5 mg via ORAL
  Filled 2017-11-07: qty 1

## 2017-11-07 MED ORDER — ONDANSETRON HCL 4 MG/2ML IJ SOLN
4.0000 mg | Freq: Four times a day (QID) | INTRAMUSCULAR | Status: DC | PRN
  Filled 2017-11-07: qty 2

## 2017-11-07 MED ORDER — POVIDONE-IODINE 10 % EX SWAB: 2.0000 "application " | Freq: Once | CUTANEOUS | Status: DC

## 2017-11-07 MED ORDER — CHLORHEXIDINE GLUCONATE 4 % EX LIQD: 60.0000 mL | Freq: Once | CUTANEOUS | Status: DC

## 2017-11-07 MED ORDER — IPRATROPIUM BROMIDE 0.02 % IN SOLN: 0.5000 mg | Freq: Four times a day (QID) | RESPIRATORY_TRACT | Status: DC

## 2017-11-07 MED ORDER — SODIUM CHLORIDE 0.9% FLUSH
3.0000 mL | Freq: Two times a day (BID) | INTRAVENOUS | Status: DC
  Administered 2017-11-09: 3 mL via INTRAVENOUS

## 2017-11-07 MED ORDER — METOPROLOL SUCCINATE ER 50 MG PO TB24
50.0000 mg | ORAL_TABLET | Freq: Every day | ORAL | Status: DC
  Administered 2017-11-08 – 2017-11-12 (×4): 50 mg via ORAL
  Filled 2017-11-07 (×5): qty 1

## 2017-11-07 MED ORDER — ALBUTEROL SULFATE (2.5 MG/3ML) 0.083% IN NEBU: 2.5000 mg | INHALATION_SOLUTION | Freq: Four times a day (QID) | RESPIRATORY_TRACT | Status: DC

## 2017-11-07 MED ORDER — FENTANYL CITRATE (PF) 100 MCG/2ML IJ SOLN
50.0000 ug | Freq: Once | INTRAMUSCULAR | Status: AC
  Administered 2017-11-07: 50 ug via INTRAVENOUS
  Filled 2017-11-07: qty 2

## 2017-11-07 MED ORDER — CLINDAMYCIN PHOSPHATE 900 MG/50ML IV SOLN
900.0000 mg | INTRAVENOUS | Status: AC
  Administered 2017-11-08: 900 mg via INTRAVENOUS
  Filled 2017-11-07: qty 50

## 2017-11-07 MED ORDER — CLINDAMYCIN PHOSPHATE 900 MG/50ML IV SOLN: 900.0000 mg | INTRAVENOUS | Status: DC

## 2017-11-07 MED ORDER — TRANEXAMIC ACID 1000 MG/10ML IV SOLN
1000.0000 mg | INTRAVENOUS | Status: AC
  Administered 2017-11-08: 1000 mg via INTRAVENOUS
  Filled 2017-11-07: qty 1100

## 2017-11-07 MED ORDER — ZOLPIDEM TARTRATE 5 MG PO TABS: 5.0000 mg | ORAL_TABLET | Freq: Every evening | ORAL | Status: DC | PRN

## 2017-11-07 MED ORDER — TRIAMCINOLONE ACETONIDE 55 MCG/ACT NA AERO
2.0000 | INHALATION_SPRAY | Freq: Every evening | NASAL | Status: DC
  Filled 2017-11-07 (×2): qty 21.6

## 2017-11-07 MED ORDER — IPRATROPIUM-ALBUTEROL 0.5-2.5 (3) MG/3ML IN SOLN: 3.0000 mL | Freq: Four times a day (QID) | RESPIRATORY_TRACT | Status: DC | PRN

## 2017-11-07 MED ORDER — NITROGLYCERIN 0.4 MG SL SUBL: 0.4000 mg | SUBLINGUAL_TABLET | SUBLINGUAL | Status: DC | PRN

## 2017-11-07 MED ORDER — LOSARTAN POTASSIUM 50 MG PO TABS: 50.0000 mg | ORAL_TABLET | Freq: Every day | ORAL | Status: DC

## 2017-11-07 MED ORDER — HYDROMORPHONE HCL 1 MG/ML IJ SOLN
1.0000 mg | Freq: Once | INTRAMUSCULAR | Status: AC
  Administered 2017-11-07: 1 mg via INTRAVENOUS
  Filled 2017-11-07: qty 1

## 2017-11-07 MED ORDER — ATORVASTATIN CALCIUM 40 MG PO TABS
40.0000 mg | ORAL_TABLET | Freq: Every day | ORAL | Status: DC
  Administered 2017-11-08 – 2017-11-11 (×3): 40 mg via ORAL
  Filled 2017-11-07 (×4): qty 1

## 2017-11-07 MED ORDER — ONDANSETRON HCL 4 MG PO TABS: 4.0000 mg | ORAL_TABLET | Freq: Four times a day (QID) | ORAL | Status: DC | PRN

## 2017-11-07 MED ORDER — ALBUTEROL SULFATE (2.5 MG/3ML) 0.083% IN NEBU
2.5000 mg | INHALATION_SOLUTION | RESPIRATORY_TRACT | Status: DC | PRN
Start: 2017-11-07 — End: 2017-11-10

## 2017-11-07 MED ORDER — MORPHINE SULFATE (PF) 2 MG/ML IV SOLN
2.0000 mg | INTRAVENOUS | Status: DC | PRN
  Administered 2017-11-08 (×4): 2 mg via INTRAVENOUS
  Filled 2017-11-07 (×4): qty 1

## 2017-11-07 MED ORDER — ENOXAPARIN SODIUM 30 MG/0.3ML ~~LOC~~ SOLN: 30.0000 mg | SUBCUTANEOUS | Status: DC

## 2017-11-07 MED ORDER — SODIUM CHLORIDE 0.9% FLUSH: 3.0000 mL | INTRAVENOUS | Status: DC | PRN

## 2017-11-07 MED ORDER — SODIUM CHLORIDE 0.9 % IV SOLN: 250.0000 mL | INTRAVENOUS | Status: DC | PRN

## 2017-11-07 MED ORDER — ACETAMINOPHEN 325 MG PO TABS: 650.0000 mg | ORAL_TABLET | Freq: Four times a day (QID) | ORAL | Status: DC | PRN

## 2017-11-07 MED ORDER — ACETAMINOPHEN 650 MG RE SUPP: 650.0000 mg | Freq: Four times a day (QID) | RECTAL | Status: DC | PRN

## 2017-11-07 MED ORDER — HYDROMORPHONE HCL 1 MG/ML IJ SOLN: 0.5000 mg | Freq: Once | INTRAMUSCULAR | Status: DC

## 2017-11-07 NOTE — ED Notes (Signed)
ED TO INPATIENT HANDOFF REPORT  Name/Age/Gender [name] 84 y.o. male  Code Status Code Status History    Date Active Date Inactive Code Status Order ID Comments User Context   10/18/2015 02:10 10/19/2015 16:58 Full Code [id number]  Etta Quill, DO ED   10/06/2015 10:09 10/08/2015 16:27 Full Code [id number]  Charolette Forward, MD ED   09/13/2015 18:52 09/15/2015 17:29 Full Code [id number]  Robbie Lis, MD Inpatient   11/06/2013 12:32 11/08/2013 17:22 Full Code [id number]  Charolette Forward, MD Inpatient   11/06/2013 05:29 11/06/2013 12:32 Full Code [id number]  Charolette Forward, MD ED   01/30/2012 20:42 02/02/2012 16:45 Full Code [id number]  Gildardo Pounds, RN Inpatient   08/04/2011 15:26 08/07/2011 13:49 Full Code [id number]  Nita Sells, MD Inpatient   07/27/2011 13:49 07/28/2011 14:20 Full Code [id number]  Darliss Cheney, RN Inpatient      Home/SNF/Other Home  Chief Complaint fall / hip pain   Level of Care/Admitting Diagnosis ED Disposition    ED Disposition Condition Comment   Transfer to Another [place]: [facility] [[id number]] Level of Care: Med-Surg [16] Diagnosis: Hip fracture [facility]) [[id number]] Admitting Physician: Merton Border Marshal.Browner Attending Physician: Laren Everts, ALI Marshal.Browner Estimated length of stay: past midnight tomo rrow Certification:: I certify this patient will need inpatient services for at least 2 midnights PT Class (Do Not Modify): Inpatient [101] PT Acc Code (Do Not Modify): Private [1]       Medical History History reviewed. No pertinent past medical history.  Allergies Allergies  Allergen Reactions  . Morphine And Related Other (See Comments)    Chest pain   . Cephalexin Other (See Comments)    Bloody loose stools    IV Location/Drains/Wounds Patient Lines/Drains/Airways Status   Active Line/Drains/Airways    Name:   Placement date:   Placement time:   Site:   Days:   Peripheral IV 11/07/17 Left Forearm   11/07/17     1723    Forearm   less than 1          Labs/Imaging Results for orders placed or performed during the hospital encounter of 11/07/17 (from the past 48 hour(s))  Basic metabolic panel     Status: Abnormal   Collection Time: 11/07/17  5:22 PM  Result Value Ref Range   Sodium 136 135 - 145 mmol/L   Potassium 4.3 3.5 - 5.1 mmol/L   Chloride 101 101 - 111 mmol/L   CO2 24 22 - 32 mmol/L   Glucose, Bld 99 65 - 99 mg/dL   BUN 9 6 - 20 mg/dL   Creatinine, Ser 0.97 0.61 - 1.24 mg/dL   Calcium 8.8 (L) 8.9 - 10.3 mg/dL   GFR calc non Af Amer >60 >60 mL/min   GFR calc Af Amer >60 >60 mL/min    Comment: (NOTE) The eGFR has been calculated using the CKD EPI equation. This calculation has not been validated in all clinical situations. eGFR's persistently <60 mL/min signify possible Chronic Kidney Disease.    Anion gap 11 5 - 15    Comment: Performed at [facility], [place] [address], [place], [place] [address]  CBC WITH DIFFERENTIAL     Status: Abnormal   Collection Time: 11/07/17  5:22 PM  Result Value Ref Range   WBC 9.6 4.0 - 10.5 K/uL   RBC 5.24 4.22 - 5.81 MIL/uL   Hemoglobin 16.7 13.0 - 17.0 g/dL   HCT 51.3 39.0 - 52.0 %  MCV 97.9 78.0 - 100.0 fL   MCH 31.9 26.0 - 34.0 pg   MCHC 32.6 30.0 - 36.0 g/dL   RDW 14.0 11.5 - 15.5 %   Platelets 146 (L) 150 - 400 K/uL   Neutrophils Relative % 81 %   Neutro Abs 7.8 (H) 1.7 - 7.7 K/uL   Lymphocytes Relative 12 %   Lymphs Abs 1.2 0.7 - 4.0 K/uL   Monocytes Relative 6 %   Monocytes Absolute 0.6 0.1 - 1.0 K/uL   Eosinophils Relative 1 %   Eosinophils Absolute 0.1 0.0 - 0.7 K/uL   Basophils Relative 0 %   Basophils Absolute 0.0 0.0 - 0.1 K/uL    Comment: Performed at [facility], [address] [place], [place] [address]  Protime-INR     Status: None   Collection Time: 11/07/17  5:22 PM  Result Value Ref Range   Prothrombin Time 13.0 11.4 - 15.2 seconds   INR 0.99     Comment: Performed at  [facility], [address] [place], [place] [address]  Type and screen [facility]     Status: None   Collection Time: 11/07/17  5:22 PM  Result Value Ref Range   ABO/RH(D) A POS    Antibody Screen NEG    Sample Expiration      11/10/2017 Performed at [facility], [address] [place], [place] [address]    Dg Chest 1 View  Result Date: 11/07/2017 CLINICAL DATA:  Fall. EXAM: CHEST  1 VIEW COMPARISON:  Chest x-ray dated October 17, 2015. FINDINGS: The heart size and mediastinal contours are within normal limits. Normal pulmonary vascularity. Atherosclerotic calcification of the aortic arch. Bibasilar atelectasis/scarring. No focal consolidation, pleural effusion, or pneumothorax. No acute osseous abnormality. IMPRESSION: No active disease. Electronically Signed   By: William T Derry M.D.   On: 11/07/2017 16:49   Dg Knee 1-2 Views Left  Result Date: 11/07/2017 CLINICAL DATA:  Left knee pain after fall. EXAM: LEFT KNEE - 1-2 VIEW COMPARISON:  None. FINDINGS: No acute fracture or dislocation. No joint effusion. Joint spaces are preserved. Bone mineralization is normal. Vascular calcifications. IMPRESSION: No acute osseous abnormality. Electronically Signed   By: William T Derry M.D.   On: 11/07/2017 16:53   Ct Head Wo Contrast  Result Date: 11/07/2017 CLINICAL DATA:  Fall with neck pain EXAM: CT HEAD WITHOUT CONTRAST CT CERVICAL SPINE WITHOUT CONTRAST TECHNIQUE: Multidetector CT imaging of the head and cervical spine was performed following the standard protocol without intravenous contrast. Multiplanar CT image reconstructions of the cervical spine were also generated. COMPARISON:  CTA head/neck 10/18/2015 FINDINGS: CT HEAD FINDINGS Brain: No mass lesion, intraparenchymal hemorrhage or extra-axial collection. No evidence of acute cortical infarct. There is periventricular hypoattenuation compatible with chronic microvascular disease.  Vascular: Atherosclerotic calcification of the vertebral and internal carotid arteries at the skull base. Skull: Normal visualized skull base, calvarium and extracranial soft tissues. Sinuses/Orbits: No sinus fluid levels or advanced mucosal thickening. No mastoid effusion. Normal orbits. CT CERVICAL SPINE FINDINGS Alignment: No static subluxation. Facets are aligned. Occipital condyles are normally positioned. Skull base and vertebrae: No acute fracture. Soft tissues and spinal canal: No prevertebral fluid or swelling. No visible canal hematoma. Disc levels: Moderate left C3-4 facet hypertrophy mild-to-moderate foraminal stenosis. Severe left C4-5 facet hypertrophy and severe foraminal stenosis. C5-C6 uncovertebral hypertrophy with moderate-to-severe bilateral foraminal stenosis. No bony spinal canal stenosis. Upper chest: No pneumothorax, pulmonary nodule or pleural effusion.   Other: Normal visualized paraspinal cervical soft tissues. IMPRESSION: 1. Chronic small vessel disease without acute intracranial abnormality. 2. No acute fracture or static subluxation of the cervical spine. 3. Multilevel neural foraminal stenosis due to combination of facet disease and uncovertebral hypertrophy. Electronically Signed   By: Ulyses Jarred M.D.   On: 11/07/2017 16:43   Ct Cervical Spine Wo Contrast  Result Date: 11/07/2017 CLINICAL DATA:  Fall with neck pain EXAM: CT HEAD WITHOUT CONTRAST CT CERVICAL SPINE WITHOUT CONTRAST TECHNIQUE: Multidetector CT imaging of the head and cervical spine was performed following the standard protocol without intravenous contrast. Multiplanar CT image reconstructions of the cervical spine were also generated. COMPARISON:  CTA head/neck 10/18/2015 FINDINGS: CT HEAD FINDINGS Brain: No mass lesion, intraparenchymal hemorrhage or extra-axial collection. No evidence of acute cortical infarct. There is periventricular hypoattenuation compatible with chronic microvascular disease. Vascular:  Atherosclerotic calcification of the vertebral and internal carotid arteries at the skull base. Skull: Normal visualized skull base, calvarium and extracranial soft tissues. Sinuses/Orbits: No sinus fluid levels or advanced mucosal thickening. No mastoid effusion. Normal orbits. CT CERVICAL SPINE FINDINGS Alignment: No static subluxation. Facets are aligned. Occipital condyles are normally positioned. Skull base and vertebrae: No acute fracture. Soft tissues and spinal canal: No prevertebral fluid or swelling. No visible canal hematoma. Disc levels: Moderate left C3-4 facet hypertrophy mild-to-moderate foraminal stenosis. Severe left C4-5 facet hypertrophy and severe foraminal stenosis. C5-C6 uncovertebral hypertrophy with moderate-to-severe bilateral foraminal stenosis. No bony spinal canal stenosis. Upper chest: No pneumothorax, pulmonary nodule or pleural effusion. Other: Normal visualized paraspinal cervical soft tissues. IMPRESSION: 1. Chronic small vessel disease without acute intracranial abnormality. 2. No acute fracture or static subluxation of the cervical spine. 3. Multilevel neural foraminal stenosis due to combination of facet disease and uncovertebral hypertrophy. Electronically Signed   By: Ulyses Jarred M.D.   On: 11/07/2017 16:43   Dg Hip Unilat W Or Wo Pelvis 2-3 Views Left  Result Date: 11/07/2017 CLINICAL DATA:  Left hip pain after fall. EXAM: DG HIP (WITH OR WITHOUT PELVIS) 2-3V LEFT COMPARISON:  None. FINDINGS: Acute transcervical versus basicervical fracture of the left femoral neck with external rotation. No dislocation. Increased sclerosis within both femoral heads. No subchondral collapse. The bones are osteopenic. The pubic symphysis and sacroiliac joints are intact. Soft tissues are unremarkable. IMPRESSION: 1. Acute left femoral neck fracture.  No dislocation. 2. 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 Electronically Signed   By: Titus Dubin M.D.   On:  11/07/2017 16:52    Pending Labs Unresulted Labs (From admission, onward)   Start     Ordered   11/07/17 1721  ABO/Rh  Once,   R     11/07/17 1721   Signed and Held  Creatinine, serum  (enoxaparin (LOVENOX)    CrCl < 30 ml/min)  Weekly,   R    Comments:  while on enoxaparin therapy.    Signed and Held   Signed and Held  Basic metabolic panel  Tomorrow morning,   R     Signed and Held      Vitals/Pain Today's Vitals   11/07/17 1907 11/07/17 2000 11/07/17 2034 11/07/17 2036  BP:  128/78 (!) 146/79 (!) 146/79  Pulse: 79 81 79 80  Resp:  _0 Temp:      TempSrc:      SpO2:   93% 95%  Weight:      Height:      PainSc: 10-Worst pain ever  Isolation Precautions No active isolations  Medications Medications  fentaNYL (SUBLIMAZE) injection 50 mcg (50 mcg Intravenous Given 11/07/17 1712)  HYDROmorphone (DILAUDID) injection 1 mg (1 mg Intravenous Given 11/07/17 1915)    Mobility non-ambulatory

## 2017-11-07 NOTE — ED Notes (Signed)
Bed: CS91 Expected date:  Expected time:  Means of arrival:  Comments: EMS/hip fx

## 2017-11-07 NOTE — ED Provider Notes (Signed)
Seen and evaluated.  Fell on his left hip.  Has pain in appearance shortening.  Difficult to determine as patient held the knee in flexion but appears to be shortened and rotated.  Plan x-rays.  The fracture may require transfer to [facility] for inpatient surgical repair.   Tanna Furry, MD 11/07/17 [phone]

## 2017-11-07 NOTE — ED Provider Notes (Signed)
[name] Provider Note   CSN: [id number] Arrival date & time: 11/07/17  1514     History   Chief Complaint Chief Complaint  Patient presents with  . Fall  . Hip Pain    HPI [name] is a 84 y.o. male with a h/o of CAD, AAA, and HTN into the emergency department with a chief complaint of fall from standing that occurred PTA.  The patient's wife states that he was reaching up to grab a bowl out of a cabinet when he lost his balance and fell onto his left side on a linoleum floor. He has been unable to bear weight on the left leg since the fall.   In the ED he complains of left hip and knee pain. Hip pain is severe, radiates down the left thigh, constant, and characterized as "throbbing".  Worse with bearing weight on the left foot and alleviated by nothing.  No LOC, nausea, or emesis.  No left ankle, right lower extremity pain, abdominal pain, chest wall or rib pain, headache, or back pain.  Treatment prior to arrival includes being placed in a c-collar by EMS and 50 fentanyl in route.  Last EF 60-65% on 01/17.   The history is provided by the patient and the spouse. No language interpreter was used.    History reviewed. No pertinent past medical history.  Patient Active Problem List   Diagnosis Date Noted  . Hip fracture ([place]) 11/07/2017  . UTI (urinary tract infection) 10/19/2015  . Altered mental status 10/18/2015  . Acute confusional state   . Stroke-like symptoms   . Psoas hematoma, left, secondary to anticoagulant therapy 10/06/2015  . Iliopsoas muscle hematoma 10/06/2015  . Dizziness 09/13/2015  . Essential hypertension 09/13/2015  . Coronary artery disease involving native coronary artery without angina pectoris 09/13/2015  . Dyslipidemia 09/13/2015  . AAA (abdominal aortic aneurysm) without rupture ([place]) 04/13/2014  . Blood in stool 01/29/2014  . Acute coronary syndrome ([place]) 11/06/2013  . History of anesthesia  reaction 02/27/2012  . CIS (carcinoma in situ of bladder) 08/03/2011  . Hyponatremia 08/02/2011  . Bladder cancer s/p resection 12/6 07/27/2011  . Hypocalcemia 02/21/2011  . DYSPHAGIA, OROPHARYNGEAL PHASE 07/13/2010  . EDEMA- LOCALIZED 03/22/2010  . PALPITATIONS 03/22/2010  . BENIGN POSITIONAL VERTIGO 10/27/2009  . HYPOTENSION, ORTHOSTATIC 10/27/2009  . TORTICOLLIS 10/27/2009  . HYPERTROPHY PROSTATE W/UR OBST & OTH LUTS 06/03/2009  . FASTING HYPERGLYCEMIA 11/24/2008  . GERD 10/06/2008  . 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 ACTION TREMOR 08/12/2007  . MEMORY LOSS 08/12/2007  . HYPERLIPIDEMIA 10/07/2006  . HYPERTENSION 10/07/2006  . DIVERTICULOSIS, COLON 10/07/2006  . COLONIC POLYPS 11/03/2005    Past Surgical History:  Procedure Laterality Date  . CATARACT EXTRACTION W/ INTRAOCULAR LENS  IMPLANT, BILATERAL Bilateral   . COLONOSCOPY  2007   Diverticulosis  . CORONARY ANGIOPLASTY WITH STENT PLACEMENT  11/06/2013   "1"  . CYSTOSCOPY W/ RETROGRADES Bilateral 12/26/2012   Procedure: CYSTOSCOPY WITH Bilateral RETROGRADE PYELOGRAM;  Surgeon: Malka So, MD;  [facility];  Service: Urology;  Laterality: Bilateral;  BLADDER BIOPSY  . ERCP  02/01/2012   Procedure: ENDOSCOPIC RETROGRADE CHOLANGIOPANCREATOGRAPHY (ERCP);  Surgeon: Beryle Beams, MD;  Location: [facility];  Service: Endoscopy;  Laterality: N/A;  . ESOPHAGOGASTRODUODENOSCOPY (EGD) WITH PROPOFOL N/A 01/29/2014   Procedure: ESOPHAGOGASTRODUODENOSCOPY (EGD) WITH PROPOFOL;  Surgeon: Inda Castle, MD;  Location: [facility];  Service: Endoscopy;  Laterality:  N/A;  . FULGURATION OF BLADDER TUMOR N/A 12/26/2012   Procedure: Multiple Bladder Biopsy;  Surgeon: Malka So, MD;  Location: [facility];  Service: Urology;  Laterality: N/A;  . LAPAROSCOPIC CHOLECYSTECTOMY  06-27-2007   Dr Johnathan Hausen  . LEFT HEART CATHETERIZATION WITH CORONARY ANGIOGRAM N/A 11/06/2013    Procedure: LEFT HEART CATHETERIZATION WITH CORONARY ANGIOGRAM;  Surgeon: Clent Demark, MD;  Location: [facility];  Service: Cardiovascular;  Laterality: N/A;  . TONSILLECTOMY    . TRANSURETHRAL RESECTION OF BLADDER TUMOR  07/27/2011   Procedure: TRANSURETHRAL RESECTION OF BLADDER TUMOR (TURBT);  Surgeon: Malka So;  Location: [facility];  Service: Urology;  Laterality: N/A;  Cystoscopy/Transurethral Resection of Bladder Tumor       Home Medications    Prior to Admission medications   Medication Sig Start Date End Date Taking? Authorizing Provider  aspirin EC 81 MG tablet Take 81 mg by mouth daily.   Yes [provider]  atorvastatin (LIPITOR) 40 MG tablet Take 40 mg by mouth daily. 08/20/15  Yes [provider]  CALCIUM PO Take 1 tablet by mouth every evening.    Yes [provider]  losartan (COZAAR) 100 MG tablet Take 50 mg by mouth daily. 10/13/17  Yes [provider]  metoprolol succinate (TOPROL-XL) 50 MG 24 hr tablet Take 50 mg by mouth daily. Take with or immediately following a meal.  12/19/13  Yes Hendricks Limes, MD  triamcinolone (NASACORT ALLERGY 24HR) 55 MCG/ACT AERO nasal inhaler Place 2 sprays into the nose every evening.    Yes [provider]  acetaminophen (TYLENOL 8 HOUR) 650 MG CR tablet Take 1 tablet (650 mg total) by mouth every 8 (eight) hours as needed for pain. Patient not taking: Reported on 11/07/2017 10/08/15   Charolette Forward, MD  bisacodyl (DULCOLAX) 5 MG EC tablet Take 1 tablet (5 mg total) by mouth daily as needed for moderate constipation. Patient not taking: Reported on 11/07/2017 10/08/15   Charolette Forward, MD  ciprofloxacin (CIPRO) 500 MG tablet Take 1 tablet (500 mg total) by mouth 2 (two) times daily. Patient not taking: Reported on 11/07/2017 10/19/15   Caren Griffins, MD  nitroGLYCERIN (NITROSTAT) 0.4 MG SL tablet Place 1 tablet (0.4 mg total) under the tongue every 5 (five) minutes x 3 doses as needed for  chest pain. 11/08/13   Charolette Forward, MD  omeprazole (PRILOSEC) 40 MG capsule Take 1 capsule (40 mg total) by mouth daily. Patient not taking: Reported on 11/07/2017 01/31/16   Esterwood, Amy S, PA-C  valsartan (DIOVAN) 320 MG tablet Take 0.5 tablets (160 mg total) by mouth daily. --- Needs office visit for further refills. Patient not taking: Reported on 11/07/2017 06/18/15   Hendricks Limes, MD  metoprolol (LOPRESSOR) 50 MG tablet TAKE ONE-HALF TABLET BY MOUTH TWICE DAILY 11/24/12 06/25/13  Hendricks Limes, MD  potassium chloride SA (K-DUR,KLOR-CON) 20 MEQ tablet Take 2 tablets (40 mEq total) by mouth 2 (two) times daily. 08/07/11 11/06/11  Nita Sells, MD  zolpidem (AMBIEN) 5 MG tablet Take 1 tablet (5 mg total) by mouth at bedtime as needed for sleep. 08/07/11 11/06/11  Nita Sells, MD    Family History Family History  Problem Relation Age of Onset  . Heart attack Father 24  . Colon cancer Mother 108  . Hypertension Mother   . Breast cancer Maternal Grandmother   . Heart attack Maternal Grandfather        late 31s  .  Heart failure Sister   . Diabetes Neg Hx     Social History Social History   Tobacco Use  . Smoking status: Former Smoker    Packs/day: 1.00    Years: 31.00    Pack years: 31.00    Types: Cigarettes    Last attempt to quit: 08/21/1973    Years since quitting: 44.2  . Smokeless tobacco: Never Used  . Tobacco comment: smoked  1944-1975 , up to 1 ppd  Substance Use Topics  . Alcohol use: Yes    Alcohol/week: 1.2 oz    Types: 2 Glasses of wine per week    Comment: 11/06/2013 "glass of wine maybe twice/wk"  . Drug use: No     Allergies   Morphine and related and Cephalexin   Review of Systems Review of Systems  Constitutional: Negative for appetite change, chills and fever.  Respiratory: Negative for shortness of breath.   Cardiovascular: Negative for chest pain.  Gastrointestinal: Negative for abdominal pain, diarrhea, nausea and vomiting.    Genitourinary: Negative for dysuria.  Musculoskeletal: Positive for arthralgias, gait problem and myalgias. Negative for back pain, neck pain and neck stiffness.  Skin: Negative for rash and wound.  Allergic/Immunologic: Negative for immunocompromised state.  Neurological: Negative for dizziness, weakness, numbness and headaches.  Psychiatric/Behavioral: Negative for confusion.     Physical Exam Updated Vital Signs BP 132/72   Pulse 80   Temp 98.1 F (36.7 C) (Oral)   Resp 14   Ht 5\' 10"  (1.778 m)   Wt 79.4 kg (175 lb)   SpO2 94%   BMI 25.11 kg/m   Physical Exam  Constitutional: He is oriented to person, place, and time. He appears well-developed.  HENT:  Head: Normocephalic.  Eyes: Conjunctivae are normal.  Neck: Neck supple.  Cardiovascular: Normal rate, regular rhythm, normal heart sounds and intact distal pulses. Exam reveals no gallop and no friction rub.  No murmur heard. Pulmonary/Chest: Effort normal and breath sounds normal. No stridor. No respiratory distress. He has no wheezes. He has no rales. He exhibits no tenderness.  Abdominal: Soft. Bowel sounds are normal. He exhibits no distension and no mass. There is no tenderness. There is no rebound and no guarding. No hernia.  No ecchymosis to the abdominal wall.  Musculoskeletal: He exhibits tenderness and deformity. He exhibits no edema.  Head-to-toe exam.   Tender to palpation over the left anterolateral hip, which is currently held in an externally rotated position.  Diffusely tender to palpation over the left knee left ankle is nontender to palpation.  DP pulse is 2+ and sensation is intact throughout.  No tenderness to palpation to the spinous processes of the thoracic or lumbar spine.   Neurological: He is alert and oriented to person, place, and time.  Skin: Skin is warm and dry. Capillary refill takes less than 2 seconds. No erythema.  Psychiatric: His behavior is normal.  Nursing note and vitals  reviewed.    ED Treatments / Results  Labs (all labs ordered are listed, but only abnormal results are displayed) Labs Reviewed  BASIC METABOLIC PANEL - Abnormal; Notable for the following components:      Result Value   Calcium 8.8 (*)    All other components within normal limits  CBC WITH DIFFERENTIAL/PLATELET - Abnormal; Notable for the following components:   Platelets 146 (*)    Neutro Abs 7.8 (*)    All other components within normal limits  SURGICAL PCR SCREEN  PROTIME-INR  BASIC METABOLIC  PANEL  TYPE AND SCREEN  ABO/RH    EKG  EKG Interpretation  Date/Time:  Wednesday November 07 2017 15:31:15 EDT Ventricular Rate:  78 PR Interval:    QRS Duration: 102 QT Interval:  397 QTC Calculation: 453 R Axis:   77 Text Interpretation:  Sinus rhythm Borderline prolonged PR interval Low voltage, precordial leads Baseline wander in lead(s) II III aVF agree. no STEMI. no sig change from previous Confirmed by Charlesetta Shanks [phone]) on 11/07/2017 11:25:55 PM       Radiology Dg Chest 1 View  Result Date: 11/07/2017 CLINICAL DATA:  Fall. EXAM: CHEST  1 VIEW COMPARISON:  Chest x-ray dated October 17, 2015. FINDINGS: The heart size and mediastinal contours are within normal limits. Normal pulmonary vascularity. Atherosclerotic calcification of the aortic arch. Bibasilar atelectasis/scarring. No focal consolidation, pleural effusion, or pneumothorax. No acute osseous abnormality. IMPRESSION: No active disease. Electronically Signed   By: Titus Dubin M.D.   On: 11/07/2017 16:49   Dg Knee 1-2 Views Left  Result Date: 11/07/2017 CLINICAL DATA:  Left knee pain after fall. EXAM: LEFT KNEE - 1-2 VIEW COMPARISON:  None. FINDINGS: No acute fracture or dislocation. No joint effusion. Joint spaces are preserved. Bone mineralization is normal. Vascular calcifications. IMPRESSION: No acute osseous abnormality. Electronically Signed   By: Titus Dubin M.D.   On: 11/07/2017 16:53   Ct Head Wo  Contrast  Result Date: 11/07/2017 CLINICAL DATA:  Fall with neck pain EXAM: CT HEAD WITHOUT CONTRAST CT CERVICAL SPINE WITHOUT CONTRAST TECHNIQUE: Multidetector CT imaging of the head and cervical spine was performed following the standard protocol without intravenous contrast. Multiplanar CT image reconstructions of the cervical spine were also generated. COMPARISON:  CTA head/neck 10/18/2015 FINDINGS: CT HEAD FINDINGS Brain: No mass lesion, intraparenchymal hemorrhage or extra-axial collection. No evidence of acute cortical infarct. There is periventricular hypoattenuation compatible with chronic microvascular disease. Vascular: Atherosclerotic calcification of the vertebral and internal carotid arteries at the skull base. Skull: Normal visualized skull base, calvarium and extracranial soft tissues. Sinuses/Orbits: No sinus fluid levels or advanced mucosal thickening. No mastoid effusion. Normal orbits. CT CERVICAL SPINE FINDINGS Alignment: No static subluxation. Facets are aligned. Occipital condyles are normally positioned. Skull base and vertebrae: No acute fracture. Soft tissues and spinal canal: No prevertebral fluid or swelling. No visible canal hematoma. Disc levels: Moderate left C3-4 facet hypertrophy mild-to-moderate foraminal stenosis. Severe left C4-5 facet hypertrophy and severe foraminal stenosis. C5-C6 uncovertebral hypertrophy with moderate-to-severe bilateral foraminal stenosis. No bony spinal canal stenosis. Upper chest: No pneumothorax, pulmonary nodule or pleural effusion. Other: Normal visualized paraspinal cervical soft tissues. IMPRESSION: 1. Chronic small vessel disease without acute intracranial abnormality. 2. No acute fracture or static subluxation of the cervical spine. 3. Multilevel neural foraminal stenosis due to combination of facet disease and uncovertebral hypertrophy. Electronically Signed   By: Ulyses Jarred M.D.   On: 11/07/2017 16:43   Ct Cervical Spine Wo  Contrast  Result Date: 11/07/2017 CLINICAL DATA:  Fall with neck pain EXAM: CT HEAD WITHOUT CONTRAST CT CERVICAL SPINE WITHOUT CONTRAST TECHNIQUE: Multidetector CT imaging of the head and cervical spine was performed following the standard protocol without intravenous contrast. Multiplanar CT image reconstructions of the cervical spine were also generated. COMPARISON:  CTA head/neck 10/18/2015 FINDINGS: CT HEAD FINDINGS Brain: No mass lesion, intraparenchymal hemorrhage or extra-axial collection. No evidence of acute cortical infarct. There is periventricular hypoattenuation compatible with chronic microvascular disease. Vascular: Atherosclerotic calcification of the vertebral and internal carotid arteries at the  skull base. Skull: Normal visualized skull base, calvarium and extracranial soft tissues. Sinuses/Orbits: No sinus fluid levels or advanced mucosal thickening. No mastoid effusion. Normal orbits. CT CERVICAL SPINE FINDINGS Alignment: No static subluxation. Facets are aligned. Occipital condyles are normally positioned. Skull base and vertebrae: No acute fracture. Soft tissues and spinal canal: No prevertebral fluid or swelling. No visible canal hematoma. Disc levels: Moderate left C3-4 facet hypertrophy mild-to-moderate foraminal stenosis. Severe left C4-5 facet hypertrophy and severe foraminal stenosis. C5-C6 uncovertebral hypertrophy with moderate-to-severe bilateral foraminal stenosis. No bony spinal canal stenosis. Upper chest: No pneumothorax, pulmonary nodule or pleural effusion. Other: Normal visualized paraspinal cervical soft tissues. IMPRESSION: 1. Chronic small vessel disease without acute intracranial abnormality. 2. No acute fracture or static subluxation of the cervical spine. 3. Multilevel neural foraminal stenosis due to combination of facet disease and uncovertebral hypertrophy. Electronically Signed   By: Ulyses Jarred M.D.   On: 11/07/2017 16:43   Dg Hip Unilat W Or Wo Pelvis 2-3  Views Left  Result Date: 11/07/2017 CLINICAL DATA:  Left hip pain after fall. EXAM: DG HIP (WITH OR WITHOUT PELVIS) 2-3V LEFT COMPARISON:  None. FINDINGS: Acute transcervical versus basicervical fracture of the left femoral neck with external rotation. No dislocation. Increased sclerosis within both femoral heads. No subchondral collapse. The bones are osteopenic. The pubic symphysis and sacroiliac joints are intact. Soft tissues are unremarkable. IMPRESSION: 1. Acute left femoral neck fracture.  No dislocation. 2. 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 Electronically Signed   By: Titus Dubin M.D.   On: 11/07/2017 16:52    Procedures Procedures (including critical care time)  Medications Ordered in ED Medications  atorvastatin (LIPITOR) tablet 40 mg (not administered)  losartan (COZAAR) tablet 50 mg (not administered)  metoprolol succinate (TOPROL-XL) 24 hr tablet 50 mg (not administered)  nitroGLYCERIN (NITROSTAT) SL tablet 0.4 mg (not administered)  triamcinolone (NASACORT) nasal inhaler 2 spray (not administered)  enoxaparin (LOVENOX) injection 30 mg (not administered)  sodium chloride flush (NS) 0.9 % injection 3 mL (not administered)  sodium chloride flush (NS) 0.9 % injection 3 mL (not administered)  0.9 %  sodium chloride infusion (not administered)  acetaminophen (TYLENOL) tablet 650 mg (not administered)    Or  acetaminophen (TYLENOL) suppository 650 mg (not administered)  oxyCODONE (Oxy IR/ROXICODONE) immediate release tablet 5 mg (not administered)  morphine 2 MG/ML injection 2 mg (not administered)  ondansetron (ZOFRAN) tablet 4 mg (not administered)    Or  ondansetron (ZOFRAN) injection 4 mg (not administered)  zolpidem (AMBIEN) tablet 5 mg (not administered)  albuterol (PROVENTIL) (2.5 MG/3ML) 0.083% nebulizer solution 2.5 mg (not administered)  albuterol (PROVENTIL) (2.5 MG/3ML) 0.083% nebulizer solution 2.5 mg (not administered)  ipratropium  (ATROVENT) nebulizer solution 0.5 mg (not administered)  povidone-iodine 10 % swab 2 application (not administered)  clindamycin (CLEOCIN) IVPB 900 mg (not administered)  chlorhexidine (HIBICLENS) 4 % liquid 4 application (not administered)  tranexamic acid (CYKLOKAPRON) 1,000 mg in sodium chloride 0.9 % 100 mL IVPB (not administered)  fentaNYL (SUBLIMAZE) injection 50 mcg (50 mcg Intravenous Given 11/07/17 1712)  HYDROmorphone (DILAUDID) injection 1 mg (1 mg Intravenous Given 11/07/17 1915)  HYDROmorphone (DILAUDID) injection 1 mg (1 mg Intravenous Given 11/07/17 2204)     Initial Impression / Assessment and Plan / ED Course  I have reviewed the triage vital signs and the nursing notes.  Pertinent labs & imaging results that were available during my care of the patient were reviewed by me and considered in  my medical decision making (see chart for details).     84 year old male presenting to the ED from home after a mechanical fall from standing.  The patient was seen and evaluated with Dr. Jeneen Rinks, attending physician.  He hit his head.  He has left hip and knee pain.  No LOC, nausea, or emesis.  X-ray of the left hip with nondisplaced  femoral neck fracture. Imaging is otherwise unremarkable.  Labs are reassuring.  EKG with no acute changes.  Pain controlled with Dilaudid.  Consulted orthopedic surgery and spoke with Dr. Tamera Punt who recommended admitting the patient at [facility] for intraoperative treatment tomorrow.  Consult to the hospitalist team and Dr. Laren Everts who will admit the patient. The patient appears reasonably stabilized for admission considering the current resources, flow, and capabilities available in the ED at this time, and I doubt any other [facility] requiring further screening and/or treatment in the ED prior to admission.  Final Clinical Impressions(s) / ED Diagnoses   Final diagnoses:  Closed fracture of neck of left femur, initial encounter [facility])    ED Discharge Orders     None       Joanne Gavel, PA-C 11/07/17 2331    Tanna Furry, MD 11/08/17 [phone]

## 2017-11-07 NOTE — H&P (Signed)
[facility]                                                                                    Patient Demographics  [name], is a 84 y.o. male  CSN: [id number]  MRN: [id number]  DOB - 1925-12-24  Admit Date - 11/07/2017  Outpatient Primary MD for the patient is Charolette Forward, MD   With History of -  History reviewed. No pertinent past medical history.    Past Surgical History:  Procedure Laterality Date  . CATARACT EXTRACTION W/ INTRAOCULAR LENS  IMPLANT, BILATERAL Bilateral   . COLONOSCOPY  2007   Diverticulosis  . CORONARY ANGIOPLASTY WITH STENT PLACEMENT  11/06/2013   "1"  . CYSTOSCOPY W/ RETROGRADES Bilateral 12/26/2012   Procedure: CYSTOSCOPY WITH Bilateral RETROGRADE PYELOGRAM;  Surgeon: Malka So, MD;  Location: [facility];  Service: Urology;  Laterality: Bilateral;  BLADDER BIOPSY  . ERCP  02/01/2012   Procedure: ENDOSCOPIC RETROGRADE CHOLANGIOPANCREATOGRAPHY (ERCP);  Surgeon: Beryle Beams, MD;  Location: [facility];  Service: Endoscopy;  Laterality: N/A;  . ESOPHAGOGASTRODUODENOSCOPY (EGD) WITH PROPOFOL N/A 01/29/2014   Procedure: ESOPHAGOGASTRODUODENOSCOPY (EGD) WITH PROPOFOL;  Surgeon: Inda Castle, MD;  Location: [facility];  Service: Endoscopy;  Laterality: N/A;  . FULGURATION OF BLADDER TUMOR N/A 12/26/2012   Procedure: Multiple Bladder Biopsy;  Surgeon: Malka So, MD;  Location: [facility];  Service: Urology;  Laterality: N/A;  . LAPAROSCOPIC CHOLECYSTECTOMY  06-27-2007   Dr Johnathan Hausen  . LEFT HEART CATHETERIZATION WITH CORONARY ANGIOGRAM N/A 11/06/2013   Procedure: LEFT HEART CATHETERIZATION WITH CORONARY ANGIOGRAM;  Surgeon: Clent Demark, MD;  Location: [facility];  Service: Cardiovascular;  Laterality: N/A;  . TONSILLECTOMY    . TRANSURETHRAL RESECTION OF BLADDER TUMOR  07/27/2011   Procedure: TRANSURETHRAL RESECTION OF BLADDER TUMOR (TURBT);  Surgeon: Malka So;  Location: [facility];   Service: Urology;  Laterality: N/A;  Cystoscopy/Transurethral Resection of Bladder Tumor    in for   Chief Complaint  Patient presents with  . Fall  . Hip Pain     HPI  [name]  is a 84 y.o. male, with past medical history significant for coronary artery disease status post angioplasty and stent placement in the past , presenting status post fall with left hip pain and left leg shortening.  Patient denies any preceding chest pains or shortness of breath.  He denies any nausea vomiting or diarrhea.  His last heart attack was around 3 years ago according to the wife who was at bedside.    Review of Systems    In addition to the HPI above,  No Fever-chills, No Headache, No changes with Vision or hearing, No problems swallowing food or Liquids, No Chest pain, Cough or Shortness of Breath, No Abdominal pain, No Nausea or Vommitting, Bowel movements are regular, No Blood in stool or Urine, No dysuria, No new skin rashes or bruises, No new joints pains-aches,  No new weakness, tingling, numbness in any extremity, No recent weight gain or loss,   A full 10 point Review of Systems was done, except as stated above, all other  Review of Systems were negative.   Social History Social History   Tobacco Use  . Smoking status: Former Smoker    Packs/day: 1.00    Years: 31.00    Pack years: 31.00    Types: Cigarettes    Last attempt to quit: 08/21/1973    Years since quitting: 44.2  . Smokeless tobacco: Never Used  . Tobacco comment: smoked  1944-1975 , up to 1 ppd  Substance Use Topics  . Alcohol use: Yes    Alcohol/week: 1.2 oz    Types: 2 Glasses of wine per week    Comment: 11/06/2013 "glass of wine maybe twice/wk"     Family History Family History  Problem Relation Age of Onset  . Heart attack Father 18  . Colon cancer Mother 30  . Hypertension Mother   . Breast cancer Maternal Grandmother   . Heart attack Maternal Grandfather        late 79s  . Heart failure  Sister   . Diabetes Neg Hx      Prior to Admission medications   Medication Sig Start Date End Date Taking? Authorizing Provider  aspirin EC 81 MG tablet Take 81 mg by mouth daily.   Yes [provider]  atorvastatin (LIPITOR) 40 MG tablet Take 40 mg by mouth daily. 08/20/15  Yes [provider]  CALCIUM PO Take 1 tablet by mouth every evening.    Yes [provider]  losartan (COZAAR) 100 MG tablet Take 50 mg by mouth daily. 10/13/17  Yes [provider]  metoprolol succinate (TOPROL-XL) 50 MG 24 hr tablet Take 50 mg by mouth daily. Take with or immediately following a meal.  12/19/13  Yes Hendricks Limes, MD  triamcinolone (NASACORT ALLERGY 24HR) 55 MCG/ACT AERO nasal inhaler Place 2 sprays into the nose every evening.    Yes [provider]  acetaminophen (TYLENOL 8 HOUR) 650 MG CR tablet Take 1 tablet (650 mg total) by mouth every 8 (eight) hours as needed for pain. Patient not taking: Reported on 11/07/2017 10/08/15   Charolette Forward, MD  bisacodyl (DULCOLAX) 5 MG EC tablet Take 1 tablet (5 mg total) by mouth daily as needed for moderate constipation. Patient not taking: Reported on 11/07/2017 10/08/15   Charolette Forward, MD  ciprofloxacin (CIPRO) 500 MG tablet Take 1 tablet (500 mg total) by mouth 2 (two) times daily. Patient not taking: Reported on 11/07/2017 10/19/15   Caren Griffins, MD  nitroGLYCERIN (NITROSTAT) 0.4 MG SL tablet Place 1 tablet (0.4 mg total) under the tongue every 5 (five) minutes x 3 doses as needed for chest pain. 11/08/13   Charolette Forward, MD  omeprazole (PRILOSEC) 40 MG capsule Take 1 capsule (40 mg total) by mouth daily. Patient not taking: Reported on 11/07/2017 01/31/16   Esterwood, Amy S, PA-C  valsartan (DIOVAN) 320 MG tablet Take 0.5 tablets (160 mg total) by mouth daily. --- Needs office visit for further refills. Patient not taking: Reported on 11/07/2017 06/18/15   Hendricks Limes, MD  metoprolol (LOPRESSOR) 50 MG  tablet TAKE ONE-HALF TABLET BY MOUTH TWICE DAILY 11/24/12 06/25/13  Hendricks Limes, MD  potassium chloride SA (K-DUR,KLOR-CON) 20 MEQ tablet Take 2 tablets (40 mEq total) by mouth 2 (two) times daily. 08/07/11 11/06/11  Nita Sells, MD  zolpidem (AMBIEN) 5 MG tablet Take 1 tablet (5 mg total) by mouth at bedtime as needed for sleep. 08/07/11 11/06/11  Nita Sells, MD    Allergies  Allergen Reactions  .  Morphine And Related Other (See Comments)    Chest pain   . Cephalexin Other (See Comments)    Bloody loose stools    Physical Exam  Vitals  Blood pressure (!) 147/79, pulse 88, temperature 98.1 F (36.7 C), temperature source Oral, resp. rate 20, height 5\' 10"  (1.778 m), weight 79.4 kg (175 lb), SpO2 97 %.   1. General elderly gentleman, well-developed, well-nourished, slightly confused  2. Normal affect and insight, Not Suicidal or Homicidal, Awake Alert, Oriented X 3.  3. No F.N deficits, patient moving all extremities.  4. Ears and Eyes appear Normal, Conjunctivae clear, PERRLA. Moist Oral Mucosa.  5. Supple Neck, No JVD,   6. Symmetrical Chest wall movement, Good air movement bilaterally, CTAB.  7. RRR, No Gallops, Rubs or Murmurs, No Parasternal Heave.  8. Positive Bowel Sounds, Abdomen Soft, Non tender, No organomegaly appriciated,No rebound -guarding or rigidity.  9.  No Cyanosis, Normal Skin Turgor, No Skin Rash or Bruise.  10. Good muscle tone,  joints appear normal , no effusions, Normal ROM.    Data Review  CBC Recent Labs  Lab 11/07/17 1722  WBC 9.6  HGB 16.7  HCT 51.3  PLT 146*  MCV 97.9  MCH 31.9  MCHC 32.6  RDW 14.0  LYMPHSABS 1.2  MONOABS 0.6  EOSABS 0.1  BASOSABS 0.0   ------------------------------------------------------------------------------------------------------------------  Chemistries  Recent Labs  Lab 11/07/17 1722  NA 136  K 4.3  CL 101  CO2 24  GLUCOSE 99  BUN 9  CREATININE 0.97  CALCIUM 8.8*    ------------------------------------------------------------------------------------------------------------------ estimated creatinine clearance is 52.3 mL/min (by C-G formula based on SCr of 0.97 mg/dL). ------------------------------------------------------------------------------------------------------------------ No results for input(s): TSH, T4TOTAL, T3FREE, THYROIDAB in the last 72 hours.  Invalid input(s): FREET3   Coagulation profile Recent Labs  Lab 11/07/17 1722  INR 0.99   ------------------------------------------------------------------------------------------------------------------- No results for input(s): DDIMER in the last 72 hours. -------------------------------------------------------------------------------------------------------------------  Cardiac Enzymes No results for input(s): CKMB, TROPONINI, MYOGLOBIN in the last 168 hours.  Invalid input(s): CK ------------------------------------------------------------------------------------------------------------------ Invalid input(s): POCBNP   ---------------------------------------------------------------------------------------------------------------  Urinalysis    Component Value Date/Time   COLORURINE AMBER (A) 10/17/2015 2311   APPEARANCEUR CLOUDY (A) 10/17/2015 2311   LABSPEC 1.025 10/17/2015 2311   PHURINE 6.0 10/17/2015 2311   GLUCOSEU NEGATIVE 10/17/2015 2311   HGBUR NEGATIVE 10/17/2015 2311   HGBUR trace-lysed 12/30/2009 1201   BILIRUBINUR SMALL (A) 10/17/2015 2311   BILIRUBINUR Neg 05/31/2011 1528   KETONESUR NEGATIVE 10/17/2015 2311   PROTEINUR NEGATIVE 10/17/2015 2311   UROBILINOGEN 2.0 (H) 01/30/2012 1156   NITRITE NEGATIVE 10/17/2015 2311   LEUKOCYTESUR TRACE (A) 10/17/2015 2311    ----------------------------------------------------------------------------------------------------------------  Imaging results:   Dg Chest 1 View  Result Date: 11/07/2017 CLINICAL DATA:   Fall. EXAM: CHEST  1 VIEW COMPARISON:  Chest x-ray dated October 17, 2015. FINDINGS: The heart size and mediastinal contours are within normal limits. Normal pulmonary vascularity. Atherosclerotic calcification of the aortic arch. Bibasilar atelectasis/scarring. No focal consolidation, pleural effusion, or pneumothorax. No acute osseous abnormality. IMPRESSION: No active disease. Electronically Signed   By: Titus Dubin M.D.   On: 11/07/2017 16:49   Dg Knee 1-2 Views Left  Result Date: 11/07/2017 CLINICAL DATA:  Left knee pain after fall. EXAM: LEFT KNEE - 1-2 VIEW COMPARISON:  None. FINDINGS: No acute fracture or dislocation. No joint effusion. Joint spaces are preserved. Bone mineralization is normal. Vascular calcifications. IMPRESSION: No acute osseous abnormality. Electronically Signed   By: Orville Govern.D.  On: 11/07/2017 16:53   Ct Head Wo Contrast  Result Date: 11/07/2017 CLINICAL DATA:  Fall with neck pain EXAM: CT HEAD WITHOUT CONTRAST CT CERVICAL SPINE WITHOUT CONTRAST TECHNIQUE: Multidetector CT imaging of the head and cervical spine was performed following the standard protocol without intravenous contrast. Multiplanar CT image reconstructions of the cervical spine were also generated. COMPARISON:  CTA head/neck 10/18/2015 FINDINGS: CT HEAD FINDINGS Brain: No mass lesion, intraparenchymal hemorrhage or extra-axial collection. No evidence of acute cortical infarct. There is periventricular hypoattenuation compatible with chronic microvascular disease. Vascular: Atherosclerotic calcification of the vertebral and internal carotid arteries at the skull base. Skull: Normal visualized skull base, calvarium and extracranial soft tissues. Sinuses/Orbits: No sinus fluid levels or advanced mucosal thickening. No mastoid effusion. Normal orbits. CT CERVICAL SPINE FINDINGS Alignment: No static subluxation. Facets are aligned. Occipital condyles are normally positioned. Skull base and vertebrae: No  acute fracture. Soft tissues and spinal canal: No prevertebral fluid or swelling. No visible canal hematoma. Disc levels: Moderate left C3-4 facet hypertrophy mild-to-moderate foraminal stenosis. Severe left C4-5 facet hypertrophy and severe foraminal stenosis. C5-C6 uncovertebral hypertrophy with moderate-to-severe bilateral foraminal stenosis. No bony spinal canal stenosis. Upper chest: No pneumothorax, pulmonary nodule or pleural effusion. Other: Normal visualized paraspinal cervical soft tissues. IMPRESSION: 1. Chronic small vessel disease without acute intracranial abnormality. 2. No acute fracture or static subluxation of the cervical spine. 3. Multilevel neural foraminal stenosis due to combination of facet disease and uncovertebral hypertrophy. Electronically Signed   By: Ulyses Jarred M.D.   On: 11/07/2017 16:43   Ct Cervical Spine Wo Contrast  Result Date: 11/07/2017 CLINICAL DATA:  Fall with neck pain EXAM: CT HEAD WITHOUT CONTRAST CT CERVICAL SPINE WITHOUT CONTRAST TECHNIQUE: Multidetector CT imaging of the head and cervical spine was performed following the standard protocol without intravenous contrast. Multiplanar CT image reconstructions of the cervical spine were also generated. COMPARISON:  CTA head/neck 10/18/2015 FINDINGS: CT HEAD FINDINGS Brain: No mass lesion, intraparenchymal hemorrhage or extra-axial collection. No evidence of acute cortical infarct. There is periventricular hypoattenuation compatible with chronic microvascular disease. Vascular: Atherosclerotic calcification of the vertebral and internal carotid arteries at the skull base. Skull: Normal visualized skull base, calvarium and extracranial soft tissues. Sinuses/Orbits: No sinus fluid levels or advanced mucosal thickening. No mastoid effusion. Normal orbits. CT CERVICAL SPINE FINDINGS Alignment: No static subluxation. Facets are aligned. Occipital condyles are normally positioned. Skull base and vertebrae: No acute fracture.  Soft tissues and spinal canal: No prevertebral fluid or swelling. No visible canal hematoma. Disc levels: Moderate left C3-4 facet hypertrophy mild-to-moderate foraminal stenosis. Severe left C4-5 facet hypertrophy and severe foraminal stenosis. C5-C6 uncovertebral hypertrophy with moderate-to-severe bilateral foraminal stenosis. No bony spinal canal stenosis. Upper chest: No pneumothorax, pulmonary nodule or pleural effusion. Other: Normal visualized paraspinal cervical soft tissues. IMPRESSION: 1. Chronic small vessel disease without acute intracranial abnormality. 2. No acute fracture or static subluxation of the cervical spine. 3. Multilevel neural foraminal stenosis due to combination of facet disease and uncovertebral hypertrophy. Electronically Signed   By: Ulyses Jarred M.D.   On: 11/07/2017 16:43   Dg Hip Unilat W Or Wo Pelvis 2-3 Views Left  Result Date: 11/07/2017 CLINICAL DATA:  Left hip pain after fall. EXAM: DG HIP (WITH OR WITHOUT PELVIS) 2-3V LEFT COMPARISON:  None. FINDINGS: Acute transcervical versus basicervical fracture of the left femoral neck with external rotation. No dislocation. Increased sclerosis within both femoral heads. No subchondral collapse. The bones are osteopenic. The pubic symphysis and sacroiliac  joints are intact. Soft tissues are unremarkable. IMPRESSION: 1. Acute left femoral neck fracture.  No dislocation. 2. 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 Electronically Signed   By: Titus Dubin M.D.   On: 11/07/2017 16:52    My personal review of EKG: Rhythm NSR, at 78 bpm with no acute changes    Assessment & Plan  1.  Acute left femoral neck fracture 2.  History of hypertension 3.  History of coronary artery disease status post stent placements in the past and?  MI around 3 years ago  Plan  Admit to [facility] Continue same medication Full liquid diet N.p.o. after midnight Orthopedics saw the patient in  ER, Dr. Tamera Punt    DVT  Prophylaxis Lovenox  AM Labs Ordered, also please review Full Orders  Family Communication: Admission, patients condition and plan of care including tests being ordered have been discussed with the patient and his wife who indicate understanding and agree with the plan and Code Status.  Code Status full  Disposition Plan: Undetermined  Time spent in minutes : 34 minutes  Condition fair   @SIGNATURE @

## 2017-11-07 NOTE — ED Triage Notes (Signed)
Unwitnessed, mechanical fall topday c/o left hip pain and external rotation obvious shortening. No LOC, no blood thinner. Hx dementia. AOx4 with EMS. 50 fentanyl IM en route.

## 2017-11-07 NOTE — Consult Note (Signed)
Reason for Consult: L hip fracture Referring Physician: Scot Shiraishi is an 84 y.o. male.  HPI: The patient is a 84 year old male who was trying to adjust the clock on a high shelf and fell.  He had immediate left hip pain and was unable to ambulate.  He presented to the emergency department and was diagnosed with left displaced femoral neck fracture.  I was consult in for evaluation and management.  Pain is in the left hip and groin area, worse of movement, better with rest.  Denies other injuries with the fall.  History reviewed. No pertinent past medical history.  Past Surgical History:  Procedure Laterality Date  . CATARACT EXTRACTION W/ INTRAOCULAR LENS  IMPLANT, BILATERAL Bilateral   . COLONOSCOPY  2007   Diverticulosis  . CORONARY ANGIOPLASTY WITH STENT PLACEMENT  11/06/2013   "1"  . CYSTOSCOPY W/ RETROGRADES Bilateral 12/26/2012   Procedure: CYSTOSCOPY WITH Bilateral RETROGRADE PYELOGRAM;  Surgeon: Malka So, MD;  Location: [facility];  Service: Urology;  Laterality: Bilateral;  BLADDER BIOPSY  . ERCP  02/01/2012   Procedure: ENDOSCOPIC RETROGRADE CHOLANGIOPANCREATOGRAPHY (ERCP);  Surgeon: Beryle Beams, MD;  Location: [facility];  Service: Endoscopy;  Laterality: N/A;  . ESOPHAGOGASTRODUODENOSCOPY (EGD) WITH PROPOFOL N/A 01/29/2014   Procedure: ESOPHAGOGASTRODUODENOSCOPY (EGD) WITH PROPOFOL;  Surgeon: Inda Castle, MD;  Location: [facility];  Service: Endoscopy;  Laterality: N/A;  . FULGURATION OF BLADDER TUMOR N/A 12/26/2012   Procedure: Multiple Bladder Biopsy;  Surgeon: Malka So, MD;  Location: [facility];  Service: Urology;  Laterality: N/A;  . LAPAROSCOPIC CHOLECYSTECTOMY  06-27-2007   Dr Johnathan Hausen  . LEFT HEART CATHETERIZATION WITH CORONARY ANGIOGRAM N/A 11/06/2013   Procedure: LEFT HEART CATHETERIZATION WITH CORONARY ANGIOGRAM;  Surgeon: Clent Demark, MD;  Location: [facility];  Service: Cardiovascular;  Laterality:  N/A;  . TONSILLECTOMY    . TRANSURETHRAL RESECTION OF BLADDER TUMOR  07/27/2011   Procedure: TRANSURETHRAL RESECTION OF BLADDER TUMOR (TURBT);  Surgeon: Malka So;  Location: [facility];  Service: Urology;  Laterality: N/A;  Cystoscopy/Transurethral Resection of Bladder Tumor    Family History  Problem Relation Age of Onset  . Heart attack Father 11  . Colon cancer Mother 66  . Hypertension Mother   . Breast cancer Maternal Grandmother   . Heart attack Maternal Grandfather        late 106s  . Heart failure Sister   . Diabetes Neg Hx     Social History:  reports that he quit smoking about 44 years ago. His smoking use included cigarettes. He has a 31.00 pack-year smoking history. he has never used smokeless tobacco. He reports that he drinks about 1.2 oz of alcohol per week. He reports that he does not use drugs.  Allergies:  Allergies  Allergen Reactions  . Morphine And Related Other (See Comments)    Chest pain   . Cephalexin Other (See Comments)    Bloody loose stools    Medications: I have reviewed the patient's current medications.  Results for orders placed or performed during the hospital encounter of 11/07/17 (from the past 48 hour(s))  Basic metabolic panel     Status: Abnormal   Collection Time: 11/07/17  5:22 PM  Result Value Ref Range   Sodium 136 135 - 145 mmol/L   Potassium 4.3 3.5 - 5.1 mmol/L   Chloride 101 101 - 111 mmol/L   CO2 24 22 - 32 mmol/L  Glucose, Bld 99 65 - 99 mg/dL   BUN 9 6 - 20 mg/dL   Creatinine, Ser 0.97 0.61 - 1.24 mg/dL   Calcium 8.8 (L) 8.9 - 10.3 mg/dL   GFR calc non Af Amer >60 >60 mL/min   GFR calc Af Amer >60 >60 mL/min    Comment: (NOTE) The eGFR has been calculated using the CKD EPI equation. This calculation has not been validated in all clinical situations. eGFR's persistently <60 mL/min signify possible Chronic Kidney Disease.    Anion gap 11 5 - 15    Comment: Performed at [facility], [place] [address], [place], [place] [address]  CBC WITH DIFFERENTIAL     Status: Abnormal   Collection Time: 11/07/17  5:22 PM  Result Value Ref Range   WBC 9.6 4.0 - 10.5 K/uL   RBC 5.24 4.22 - 5.81 MIL/uL   Hemoglobin 16.7 13.0 - 17.0 g/dL   HCT 51.3 39.0 - 52.0 %   MCV 97.9 78.0 - 100.0 fL   MCH 31.9 26.0 - 34.0 pg   MCHC 32.6 30.0 - 36.0 g/dL   RDW 14.0 11.5 - 15.5 %   Platelets 146 (L) 150 - 400 K/uL   Neutrophils Relative % 81 %   Neutro Abs 7.8 (H) 1.7 - 7.7 K/uL   Lymphocytes Relative 12 %   Lymphs Abs 1.2 0.7 - 4.0 K/uL   Monocytes Relative 6 %   Monocytes Absolute 0.6 0.1 - 1.0 K/uL   Eosinophils Relative 1 %   Eosinophils Absolute 0.1 0.0 - 0.7 K/uL   Basophils Relative 0 %   Basophils Absolute 0.0 0.0 - 0.1 K/uL    Comment: Performed at [facility], [place] [address], [place], [place] [address]  Protime-INR     Status: None   Collection Time: 11/07/17  5:22 PM  Result Value Ref Range   Prothrombin Time 13.0 11.4 - 15.2 seconds   INR 0.99     Comment: Performed at [facility], [place] [address] [place], [place] [address]  Type and screen [place]     Status: None   Collection Time: 11/07/17  5:22 PM  Result Value Ref Range   ABO/RH(D) A POS    Antibody Screen NEG    Sample Expiration      11/10/2017 Performed at [facility], [place] [address] [place], [place] [address]     Dg Chest 1 View  Result Date: 11/07/2017 CLINICAL DATA:  Fall. EXAM: CHEST  1 VIEW COMPARISON:  Chest x-ray dated October 17, 2015. FINDINGS: The heart size and mediastinal contours are within normal limits. Normal pulmonary vascularity. Atherosclerotic calcification of the aortic arch. Bibasilar atelectasis/scarring. No focal consolidation, pleural effusion, or pneumothorax. No acute osseous abnormality. IMPRESSION: No active disease. Electronically Signed   By: Titus Dubin M.D.   On: 11/07/2017 16:49   Dg Knee 1-2 Views  Left  Result Date: 11/07/2017 CLINICAL DATA:  Left knee pain after fall. EXAM: LEFT KNEE - 1-2 VIEW COMPARISON:  None. FINDINGS: No acute fracture or dislocation. No joint effusion. Joint spaces are preserved. Bone mineralization is normal. Vascular calcifications. IMPRESSION: No acute osseous abnormality. Electronically Signed   By: Titus Dubin M.D.   On: 11/07/2017 16:53   Ct Head Wo Contrast  Result Date: 11/07/2017 CLINICAL DATA:  Fall with neck pain EXAM: CT HEAD WITHOUT CONTRAST CT CERVICAL SPINE WITHOUT CONTRAST TECHNIQUE: Multidetector CT imaging of the head and cervical  spine was performed following the standard protocol without intravenous contrast. Multiplanar CT image reconstructions of the cervical spine were also generated. COMPARISON:  CTA head/neck 10/18/2015 FINDINGS: CT HEAD FINDINGS Brain: No mass lesion, intraparenchymal hemorrhage or extra-axial collection. No evidence of acute cortical infarct. There is periventricular hypoattenuation compatible with chronic microvascular disease. Vascular: Atherosclerotic calcification of the vertebral and internal carotid arteries at the skull base. Skull: Normal visualized skull base, calvarium and extracranial soft tissues. Sinuses/Orbits: No sinus fluid levels or advanced mucosal thickening. No mastoid effusion. Normal orbits. CT CERVICAL SPINE FINDINGS Alignment: No static subluxation. Facets are aligned. Occipital condyles are normally positioned. Skull base and vertebrae: No acute fracture. Soft tissues and spinal canal: No prevertebral fluid or swelling. No visible canal hematoma. Disc levels: Moderate left C3-4 facet hypertrophy mild-to-moderate foraminal stenosis. Severe left C4-5 facet hypertrophy and severe foraminal stenosis. C5-C6 uncovertebral hypertrophy with moderate-to-severe bilateral foraminal stenosis. No bony spinal canal stenosis. Upper chest: No pneumothorax, pulmonary nodule or pleural effusion. Other: Normal visualized  paraspinal cervical soft tissues. IMPRESSION: 1. Chronic small vessel disease without acute intracranial abnormality. 2. No acute fracture or static subluxation of the cervical spine. 3. Multilevel neural foraminal stenosis due to combination of facet disease and uncovertebral hypertrophy. Electronically Signed   By: Ulyses Jarred M.D.   On: 11/07/2017 16:43   Ct Cervical Spine Wo Contrast  Result Date: 11/07/2017 CLINICAL DATA:  Fall with neck pain EXAM: CT HEAD WITHOUT CONTRAST CT CERVICAL SPINE WITHOUT CONTRAST TECHNIQUE: Multidetector CT imaging of the head and cervical spine was performed following the standard protocol without intravenous contrast. Multiplanar CT image reconstructions of the cervical spine were also generated. COMPARISON:  CTA head/neck 10/18/2015 FINDINGS: CT HEAD FINDINGS Brain: No mass lesion, intraparenchymal hemorrhage or extra-axial collection. No evidence of acute cortical infarct. There is periventricular hypoattenuation compatible with chronic microvascular disease. Vascular: Atherosclerotic calcification of the vertebral and internal carotid arteries at the skull base. Skull: Normal visualized skull base, calvarium and extracranial soft tissues. Sinuses/Orbits: No sinus fluid levels or advanced mucosal thickening. No mastoid effusion. Normal orbits. CT CERVICAL SPINE FINDINGS Alignment: No static subluxation. Facets are aligned. Occipital condyles are normally positioned. Skull base and vertebrae: No acute fracture. Soft tissues and spinal canal: No prevertebral fluid or swelling. No visible canal hematoma. Disc levels: Moderate left C3-4 facet hypertrophy mild-to-moderate foraminal stenosis. Severe left C4-5 facet hypertrophy and severe foraminal stenosis. C5-C6 uncovertebral hypertrophy with moderate-to-severe bilateral foraminal stenosis. No bony spinal canal stenosis. Upper chest: No pneumothorax, pulmonary nodule or pleural effusion. Other: Normal visualized paraspinal  cervical soft tissues. IMPRESSION: 1. Chronic small vessel disease without acute intracranial abnormality. 2. No acute fracture or static subluxation of the cervical spine. 3. Multilevel neural foraminal stenosis due to combination of facet disease and uncovertebral hypertrophy. Electronically Signed   By: Ulyses Jarred M.D.   On: 11/07/2017 16:43   Dg Hip Unilat W Or Wo Pelvis 2-3 Views Left  Result Date: 11/07/2017 CLINICAL DATA:  Left hip pain after fall. EXAM: DG HIP (WITH OR WITHOUT PELVIS) 2-3V LEFT COMPARISON:  None. FINDINGS: Acute transcervical versus basicervical fracture of the left femoral neck with external rotation. No dislocation. Increased sclerosis within both femoral heads. No subchondral collapse. The bones are osteopenic. The pubic symphysis and sacroiliac joints are intact. Soft tissues are unremarkable. IMPRESSION: 1. Acute left femoral neck fracture.  No dislocation. 2. 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 Electronically Signed   By: Titus Dubin M.D.   On: 11/07/2017  16:52    Review of Systems  All other systems reviewed and are negative.  Blood pressure (!) 147/79, pulse 88, temperature 98.1 F (36.7 C), temperature source Oral, resp. rate 20, height '5\' 10"'$  (1.778 m), weight 79.4 kg (175 lb), SpO2 97 %. Physical Exam  Constitutional: He is oriented to person, place, and time. He appears well-developed and well-nourished.  HENT:  Head: Atraumatic.  Eyes: EOM are normal.  Cardiovascular: Intact distal pulses.  Respiratory: Effort normal.  Musculoskeletal:  LLE shortened and externally rotated. NVID.  Knee non TTP. L hip TTP.  Bilat UEs no pain with ROM or TTP.   Neurological: He is alert and oriented to person, place, and time.  Skin: Skin is warm and dry.  Psychiatric: He has a normal mood and affect.    Assessment/Plan: Left hip displaced femoral neck fracture I spoke at length with the patient and his family regarding diagnosis prognosis  and treatment options.  My recommendation would be for left hip hemiarthroplasty to allow for early pain relief and early ambulation to try and prevent complications of bedrest and get him up and moving quickly.  Talked about risks benefits and alternatives procedure and all agree that this would be the best course for him.  All questions were welcomed and answered. We will get him transferred over to [facility] and will remain n.p.o. After midnight for surgery tomorrow. I'm going to discuss this case with colleagues in hopes that someone may have time to get this done sooner than I would be able to get to it tomorrow for him.  Isabella Stalling 11/07/2017, 8:01 PM

## 2017-11-08 ENCOUNTER — Inpatient Hospital Stay (HOSPITAL_COMMUNITY): Payer: Medicare Other | Admitting: Certified Registered Nurse Anesthetist

## 2017-11-08 ENCOUNTER — Encounter (HOSPITAL_COMMUNITY)
Admission: EM | Disposition: A | Discharge status: Home Health | Payer: Self-pay | Source: Home / Self Care | Attending: Internal Medicine

## 2017-11-08 ENCOUNTER — Inpatient Hospital Stay (HOSPITAL_COMMUNITY): Payer: Medicare Other

## 2017-11-08 ENCOUNTER — Encounter (HOSPITAL_COMMUNITY): Payer: Self-pay | Admitting: *Deleted

## 2017-11-08 DIAGNOSIS — S72002A Fracture of unspecified part of neck of left femur, initial encounter for closed fracture: Secondary | ICD-10-CM

## 2017-11-08 DIAGNOSIS — F0390 Unspecified dementia without behavioral disturbance: Secondary | ICD-10-CM

## 2017-11-08 DIAGNOSIS — I10 Essential (primary) hypertension: Secondary | ICD-10-CM

## 2017-11-08 DIAGNOSIS — I2510 Atherosclerotic heart disease of native coronary artery without angina pectoris: Secondary | ICD-10-CM

## 2017-11-08 HISTORY — PX: TOTAL HIP ARTHROPLASTY: SHX124

## 2017-11-08 LAB — CBC
HEMATOCRIT: 47.2 % (ref 39.0–52.0)
HEMOGLOBIN: 16.1 g/dL (ref 13.0–17.0)
MCH: 32.8 pg (ref 26.0–34.0)
MCHC: 34.1 g/dL (ref 30.0–36.0)
MCV: 96.1 fL (ref 78.0–100.0)
Platelets: 133 10*3/uL — ABNORMAL LOW (ref 150–400)
RBC: 4.91 MIL/uL (ref 4.22–5.81)
RDW: 13.6 % (ref 11.5–15.5)
WBC: 10.8 10*3/uL — ABNORMAL HIGH (ref 4.0–10.5)

## 2017-11-08 LAB — BASIC METABOLIC PANEL
ANION GAP: 8 (ref 5–15)
BUN: 12 mg/dL (ref 6–20)
CHLORIDE: 103 mmol/L (ref 101–111)
CO2: 25 mmol/L (ref 22–32)
Calcium: 8.8 mg/dL — ABNORMAL LOW (ref 8.9–10.3)
Creatinine, Ser: 1.12 mg/dL (ref 0.61–1.24)
GFR calc non Af Amer: 56 mL/min — ABNORMAL LOW (ref >60)
Glucose, Bld: 133 mg/dL — ABNORMAL HIGH (ref 65–99)
POTASSIUM: 4.3 mmol/L (ref 3.5–5.1)
SODIUM: 136 mmol/L (ref 135–145)

## 2017-11-08 LAB — ABO/RH: ABO/RH(D): A POS

## 2017-11-08 LAB — SURGICAL PCR SCREEN
MRSA, PCR: NEGATIVE
Staphylococcus aureus: NEGATIVE

## 2017-11-08 SURGERY — ARTHROPLASTY, HIP, TOTAL, ANTERIOR APPROACH
Anesthesia: General | Site: Hip | Laterality: Left

## 2017-11-08 SURGERY — ARTHROPLASTY, HIP, TOTAL, ANTERIOR APPROACH
Anesthesia: Choice | Laterality: Left

## 2017-11-08 MED ORDER — ROCURONIUM BROMIDE 10 MG/ML (PF) SYRINGE
PREFILLED_SYRINGE | INTRAVENOUS | Status: AC
  Filled 2017-11-08: qty 10

## 2017-11-08 MED ORDER — CELECOXIB 200 MG PO CAPS
200.0000 mg | ORAL_CAPSULE | Freq: Two times a day (BID) | ORAL | Status: DC
  Administered 2017-11-08: 200 mg via ORAL
  Filled 2017-11-08: qty 1

## 2017-11-08 MED ORDER — SUGAMMADEX SODIUM 500 MG/5ML IV SOLN
INTRAVENOUS | Status: AC
  Filled 2017-11-08: qty 5

## 2017-11-08 MED ORDER — HYDROCODONE-ACETAMINOPHEN 7.5-325 MG PO TABS: 1.0000 | ORAL_TABLET | ORAL | Status: DC | PRN

## 2017-11-08 MED ORDER — SODIUM CHLORIDE 0.9 % IV SOLN
INTRAVENOUS | Status: DC
  Administered 2017-11-08: 19:00:00 via INTRAVENOUS

## 2017-11-08 MED ORDER — ALUM & MAG HYDROXIDE-SIMETH 200-200-20 MG/5ML PO SUSP: 30.0000 mL | ORAL | Status: DC | PRN

## 2017-11-08 MED ORDER — LACTATED RINGERS IV SOLN
INTRAVENOUS | Status: DC
  Administered 2017-11-08: 13:00:00 via INTRAVENOUS

## 2017-11-08 MED ORDER — MENTHOL 3 MG MT LOZG: 1.0000 | LOZENGE | OROMUCOSAL | Status: DC | PRN

## 2017-11-08 MED ORDER — VANCOMYCIN HCL 1000 MG IV SOLR
INTRAVENOUS | Status: AC
  Filled 2017-11-08: qty 1000

## 2017-11-08 MED ORDER — EPHEDRINE SULFATE 50 MG/ML IJ SOLN
INTRAMUSCULAR | Status: DC | PRN
  Administered 2017-11-08 (×5): 5 mg via INTRAVENOUS
  Administered 2017-11-08: 10 mg via INTRAVENOUS

## 2017-11-08 MED ORDER — ENOXAPARIN SODIUM 40 MG/0.4ML ~~LOC~~ SOLN
40.0000 mg | SUBCUTANEOUS | Status: DC
  Administered 2017-11-09 – 2017-11-11 (×3): 40 mg via SUBCUTANEOUS
  Filled 2017-11-08 (×3): qty 0.4

## 2017-11-08 MED ORDER — PHENOL 1.4 % MT LIQD: 1.0000 | OROMUCOSAL | Status: DC | PRN

## 2017-11-08 MED ORDER — VANCOMYCIN HCL 1000 MG IV SOLR
INTRAVENOUS | Status: DC | PRN
  Administered 2017-11-08: 1000 mg

## 2017-11-08 MED ORDER — SUGAMMADEX SODIUM 200 MG/2ML IV SOLN
INTRAVENOUS | Status: DC | PRN
  Administered 2017-11-08: 200 mg via INTRAVENOUS

## 2017-11-08 MED ORDER — HALOPERIDOL LACTATE 5 MG/ML IJ SOLN
INTRAMUSCULAR | Status: AC
  Administered 2017-11-08: 5 mg
  Filled 2017-11-08: qty 1

## 2017-11-08 MED ORDER — FENTANYL CITRATE (PF) 100 MCG/2ML IJ SOLN
25.0000 ug | INTRAMUSCULAR | Status: DC | PRN
  Administered 2017-11-08: 50 ug via INTRAVENOUS

## 2017-11-08 MED ORDER — ONDANSETRON HCL 4 MG/2ML IJ SOLN
INTRAMUSCULAR | Status: AC
  Filled 2017-11-08: qty 4

## 2017-11-08 MED ORDER — METOCLOPRAMIDE HCL 5 MG PO TABS: 5.0000 mg | ORAL_TABLET | Freq: Three times a day (TID) | ORAL | Status: DC | PRN

## 2017-11-08 MED ORDER — ENOXAPARIN SODIUM 40 MG/0.4ML ~~LOC~~ SOLN: 40.0000 mg | Freq: Every day | SUBCUTANEOUS | 0 refills | Status: AC

## 2017-11-08 MED ORDER — OXYCODONE-ACETAMINOPHEN 5-325 MG PO TABS: 1.0000 | ORAL_TABLET | ORAL | 0 refills | Status: AC | PRN

## 2017-11-08 MED ORDER — SODIUM CHLORIDE 0.9 % IV SOLN
2000.0000 mg | INTRAVENOUS | Status: AC
  Administered 2017-11-08: 2000 mg via TOPICAL
  Filled 2017-11-08: qty 20

## 2017-11-08 MED ORDER — LIDOCAINE HCL (CARDIAC) 20 MG/ML IV SOLN
INTRAVENOUS | Status: AC
  Filled 2017-11-08: qty 10

## 2017-11-08 MED ORDER — ONDANSETRON HCL 4 MG/2ML IJ SOLN
4.0000 mg | Freq: Four times a day (QID) | INTRAMUSCULAR | Status: DC | PRN
  Administered 2017-11-08: 4 mg via INTRAVENOUS

## 2017-11-08 MED ORDER — SUGAMMADEX SODIUM 200 MG/2ML IV SOLN
INTRAVENOUS | Status: AC
  Filled 2017-11-08: qty 2

## 2017-11-08 MED ORDER — FENTANYL CITRATE (PF) 100 MCG/2ML IJ SOLN
INTRAMUSCULAR | Status: AC
  Filled 2017-11-08: qty 2

## 2017-11-08 MED ORDER — VANCOMYCIN HCL IN DEXTROSE 1-5 GM/200ML-% IV SOLN
1000.0000 mg | Freq: Two times a day (BID) | INTRAVENOUS | Status: AC
  Administered 2017-11-09: 1000 mg via INTRAVENOUS
  Filled 2017-11-08 (×2): qty 200

## 2017-11-08 MED ORDER — 0.9 % SODIUM CHLORIDE (POUR BTL) OPTIME
TOPICAL | Status: DC | PRN
  Administered 2017-11-08: 1000 mL

## 2017-11-08 MED ORDER — LIDOCAINE 2% (20 MG/ML) 5 ML SYRINGE
INTRAMUSCULAR | Status: DC | PRN
  Administered 2017-11-08: 60 mg via INTRAVENOUS

## 2017-11-08 MED ORDER — DOCUSATE SODIUM 100 MG PO CAPS
100.0000 mg | ORAL_CAPSULE | Freq: Two times a day (BID) | ORAL | Status: DC
  Administered 2017-11-08 – 2017-11-12 (×7): 100 mg via ORAL
  Filled 2017-11-08 (×8): qty 1

## 2017-11-08 MED ORDER — FENTANYL CITRATE (PF) 250 MCG/5ML IJ SOLN
INTRAMUSCULAR | Status: AC
  Filled 2017-11-08: qty 5

## 2017-11-08 MED ORDER — FENTANYL CITRATE (PF) 250 MCG/5ML IJ SOLN
INTRAMUSCULAR | Status: DC | PRN
  Administered 2017-11-08 (×3): 50 ug via INTRAVENOUS

## 2017-11-08 MED ORDER — DEXAMETHASONE SODIUM PHOSPHATE 10 MG/ML IJ SOLN
INTRAMUSCULAR | Status: AC
  Filled 2017-11-08: qty 1

## 2017-11-08 MED ORDER — ONDANSETRON HCL 4 MG/2ML IJ SOLN
INTRAMUSCULAR | Status: DC | PRN
  Administered 2017-11-08: 4 mg via INTRAVENOUS

## 2017-11-08 MED ORDER — ACETAMINOPHEN 325 MG PO TABS
325.0000 mg | ORAL_TABLET | Freq: Four times a day (QID) | ORAL | Status: DC | PRN
  Administered 2017-11-08 – 2017-11-09 (×2): 650 mg via ORAL
  Filled 2017-11-08 (×3): qty 2

## 2017-11-08 MED ORDER — EPHEDRINE 5 MG/ML INJ
INTRAVENOUS | Status: AC
  Filled 2017-11-08: qty 10

## 2017-11-08 MED ORDER — HYDROCODONE-ACETAMINOPHEN 5-325 MG PO TABS: 1.0000 | ORAL_TABLET | ORAL | Status: DC | PRN

## 2017-11-08 MED ORDER — PROPOFOL 10 MG/ML IV BOLUS
INTRAVENOUS | Status: DC | PRN
  Administered 2017-11-08: 50 mg via INTRAVENOUS

## 2017-11-08 MED ORDER — SODIUM CHLORIDE 0.9 % IR SOLN
Status: DC | PRN
  Administered 2017-11-08: 3000 mL

## 2017-11-08 MED ORDER — PROPOFOL 10 MG/ML IV BOLUS
INTRAVENOUS | Status: AC
  Filled 2017-11-08: qty 20

## 2017-11-08 MED ORDER — POVIDONE-IODINE 10 % EX SWAB: 2.0000 "application " | Freq: Once | CUTANEOUS | Status: DC

## 2017-11-08 MED ORDER — METOCLOPRAMIDE HCL 5 MG/ML IJ SOLN: 5.0000 mg | Freq: Three times a day (TID) | INTRAMUSCULAR | Status: DC | PRN

## 2017-11-08 MED ORDER — LACTATED RINGERS IV SOLN
INTRAVENOUS | Status: DC | PRN
  Administered 2017-11-08 (×2): via INTRAVENOUS

## 2017-11-08 MED ORDER — ONDANSETRON HCL 4 MG PO TABS: 4.0000 mg | ORAL_TABLET | Freq: Four times a day (QID) | ORAL | Status: DC | PRN

## 2017-11-08 MED ORDER — TRANEXAMIC ACID 1000 MG/10ML IV SOLN: 1000.0000 mg | INTRAVENOUS | Status: DC

## 2017-11-08 MED ORDER — VANCOMYCIN HCL IN DEXTROSE 1-5 GM/200ML-% IV SOLN: 1000.0000 mg | INTRAVENOUS | Status: DC

## 2017-11-08 MED ORDER — DEXAMETHASONE SODIUM PHOSPHATE 10 MG/ML IJ SOLN
INTRAMUSCULAR | Status: DC | PRN
Start: 2017-11-08 — End: 2017-11-08
  Administered 2017-11-08: 10 mg via INTRAVENOUS

## 2017-11-08 MED ORDER — PHENYLEPHRINE HCL 10 MG/ML IJ SOLN
INTRAVENOUS | Status: DC | PRN
  Administered 2017-11-08: 25 ug/min via INTRAVENOUS

## 2017-11-08 MED ORDER — ROCURONIUM BROMIDE 10 MG/ML (PF) SYRINGE
PREFILLED_SYRINGE | INTRAVENOUS | Status: DC | PRN
  Administered 2017-11-08: 40 mg via INTRAVENOUS
  Administered 2017-11-08: 20 mg via INTRAVENOUS

## 2017-11-08 SURGICAL SUPPLY — 49 items
BAG DECANTER FOR FLEXI CONT (MISCELLANEOUS) ×3 IMPLANT
CAPT HIP HEMI 2 ×2 IMPLANT
CELLS DAT CNTRL 66122 CELL SVR (MISCELLANEOUS) IMPLANT
COVER SURGICAL LIGHT HANDLE (MISCELLANEOUS) ×3 IMPLANT
DRAPE C-ARM 42X72 X-RAY (DRAPES) ×3 IMPLANT
DRAPE POUCH INSTRU U-SHP 10X18 (DRAPES) ×3 IMPLANT
DRAPE STERI IOBAN 125X83 (DRAPES) ×3 IMPLANT
DRAPE U-SHAPE 47X51 STRL (DRAPES) ×6 IMPLANT
DRSG AQUACEL AG ADV 3.5X10 (GAUZE/BANDAGES/DRESSINGS) ×1 IMPLANT
DRSG MEPILEX BORDER 4X8 (GAUZE/BANDAGES/DRESSINGS) ×2 IMPLANT
DURAPREP 26ML APPLICATOR (WOUND CARE) ×3 IMPLANT
ELECT BLADE 4.0 EZ CLEAN MEGAD (MISCELLANEOUS) ×3
ELECT REM PT RETURN 9FT ADLT (ELECTROSURGICAL) ×3
ELECTRODE BLDE 4.0 EZ CLN MEGD (MISCELLANEOUS) ×1 IMPLANT
ELECTRODE REM PT RTRN 9FT ADLT (ELECTROSURGICAL) ×1 IMPLANT
GAUZE XEROFORM 5X9 LF (GAUZE/BANDAGES/DRESSINGS) ×2 IMPLANT
GLOVE BIOGEL PI IND STRL 7.0 (GLOVE) ×1 IMPLANT
GLOVE BIOGEL PI INDICATOR 7.0 (GLOVE) ×2
GLOVE ECLIPSE 7.0 STRL STRAW (GLOVE) ×3 IMPLANT
GLOVE SKINSENSE NS SZ7.5 (GLOVE) ×2
GLOVE SKINSENSE STRL SZ7.5 (GLOVE) ×1 IMPLANT
GLOVE SURG SYN 7.5  E (GLOVE) ×4
GLOVE SURG SYN 7.5 E (GLOVE) ×2 IMPLANT
GLOVE SURG SYN 7.5 PF PI (GLOVE) ×2 IMPLANT
GOWN SRG XL XLNG 56XLVL 4 (GOWN DISPOSABLE) ×1 IMPLANT
GOWN STRL NON-REIN XL XLG LVL4 (GOWN DISPOSABLE) ×3
GOWN STRL REUS W/ TWL LRG LVL3 (GOWN DISPOSABLE) IMPLANT
GOWN STRL REUS W/TWL LRG LVL3 (GOWN DISPOSABLE)
HANDPIECE INTERPULSE COAX TIP (DISPOSABLE) ×3
HOOD PEEL AWAY FLYTE STAYCOOL (MISCELLANEOUS) ×6 IMPLANT
IV NS IRRIG 3000ML ARTHROMATIC (IV SOLUTION) ×3 IMPLANT
KIT BASIN OR (CUSTOM PROCEDURE TRAY) ×3 IMPLANT
MARKER SKIN DUAL TIP RULER LAB (MISCELLANEOUS) ×3 IMPLANT
PACK TOTAL JOINT (CUSTOM PROCEDURE TRAY) ×3 IMPLANT
PACK UNIVERSAL I (CUSTOM PROCEDURE TRAY) ×3 IMPLANT
RETRACTOR WND ALEXIS 18 MED (MISCELLANEOUS) ×1 IMPLANT
RTRCTR WOUND ALEXIS 18CM MED (MISCELLANEOUS)
SAW OSC TIP CART 19.5X105X1.3 (SAW) ×3 IMPLANT
SET HNDPC FAN SPRY TIP SCT (DISPOSABLE) ×1 IMPLANT
STAPLER VISISTAT 35W (STAPLE) IMPLANT
SUT ETHIBOND 2 V 37 (SUTURE) ×3 IMPLANT
SUT ETHILON 2 0 FS 18 (SUTURE) ×6 IMPLANT
SUT VIC AB 1 CT1 27 (SUTURE) ×3
SUT VIC AB 1 CT1 27XBRD ANBCTR (SUTURE) ×1 IMPLANT
SUT VIC AB 2-0 CT1 27 (SUTURE) ×3
SUT VIC AB 2-0 CT1 TAPERPNT 27 (SUTURE) ×1 IMPLANT
TOWEL OR 17X26 10 PK STRL BLUE (TOWEL DISPOSABLE) ×3 IMPLANT
TRAY CATH 16FR W/PLASTIC CATH (SET/KITS/TRAYS/PACK) ×1 IMPLANT
YANKAUER SUCT BULB TIP NO VENT (SUCTIONS) ×3 IMPLANT

## 2017-11-08 NOTE — Progress Notes (Signed)
PROGRESS NOTE    [name]   MCN:[id number]  DOB: 06/15/1926  DOA: 11/07/2017 PCP: Charolette Forward, MD   Brief Narrative:  DEMARRIUS Burns is a 84 y.o. male, with past medical history significant for coronary artery disease status post angioplasty and stent placement, dementia who lives at home with wife presented status post fall with left hip pain and is found to have a left hip fracture. Evaluated by Ortho. Plans for left hip hemiarthroplasty.   Subjective: His main complaint is pain in left hip. ROS: no complaints of nausea, vomiting, constipation diarrhea, cough, dyspnea or dysuria. No other complaints.   Assessment & Plan:   Active Problems: Left Hip fracture  - per ortho  H/o CAD, HTN - on Lipitor, Metoprolol, Cozaar as he takes at home- follow BP - ASA 81 mg on hold - no noted chest pain or dyspnea recently by patient of wife- EKG shows NSR  Grade 1dCHF per ECHO in 1/17 - follow fluid status  Mild acute thrombocytopenia - follow  Mild dementia - wife is a better historian- follow for behavioral disturbances  DVT prophylaxis: per ortho Code Status: Full code Family Communication: with his wife Disposition Plan: f/u post op need Consultants:   ortho Procedures:    Antimicrobials:  Anti-infectives (From admission, onward)   Start     Dose/Rate Route Frequency Ordered Stop   11/08/17 1400  clindamycin (CLEOCIN) IVPB 900 mg     900 mg 100 mL/hr over 30 Minutes Intravenous To Surgery 11/07/17 2241 11/09/17 1400   11/08/17 0600  clindamycin (CLEOCIN) IVPB 900 mg  Status:  Discontinued     900 mg 100 mL/hr over 30 Minutes Intravenous On call to O.R. 11/07/17 2241 11/07/17 2245       Objective: Vitals:   11/07/17 2130 11/07/17 2205 11/07/17 2310 11/08/17 0504  BP: 136/72 132/72 (!) 151/73   Pulse: 80 80 80 81  Resp: 14 14 16 16   Temp:   98.9 F (37.2 C) 98.8 F (37.1 C)  TempSrc:   Oral Oral  SpO2:  94% 93% 96%  Weight:      Height:         Intake/Output Summary (Last 24 hours) at 11/08/2017 1054 Last data filed at 11/08/2017 6294 Gross per 24 hour  Intake -  Output 300 ml  Net -300 ml   Filed Weights   11/07/17 1531  Weight: 79.4 kg (175 lb)    Examination: General exam: Appears comfortable  HEENT: PERRLA, oral mucosa moist, no sclera icterus or thrush Respiratory system: Clear to auscultation. Respiratory effort normal. Cardiovascular system: S1 & S2 heard, RRR.  No murmurs  Gastrointestinal system: Abdomen soft, non-tender, nondistended. Normal bowel sound. No organomegaly Central nervous system: Alert and oriented. No focal neurological deficits. Extremities: No cyanosis, clubbing or edema Skin: No rashes or ulcers Psychiatry:  Mood & affect appropriate.     Data Reviewed: I have personally reviewed following labs and imaging studies  CBC: Recent Labs  Lab 11/07/17 1722 11/08/17 0820  WBC 9.6 10.8*  NEUTROABS 7.8*  --   HGB 16.7 16.1  HCT 51.3 47.2  MCV 97.9 96.1  PLT 146* 765*   Basic Metabolic Panel: Recent Labs  Lab 11/07/17 1722 11/08/17 0820  NA 136 136  K 4.3 4.3  CL 101 103  CO2 24 25  GLUCOSE 99 133*  BUN 9 12  CREATININE 0.97 1.12  CALCIUM 8.8* 8.8*   GFR: Estimated Creatinine Clearance: 45.3 mL/min (by C-G  formula based on SCr of 1.12 mg/dL). Liver Function Tests: No results for input(s): AST, ALT, ALKPHOS, BILITOT, PROT, ALBUMIN in the last 168 hours. No results for input(s): LIPASE, AMYLASE in the last 168 hours. No results for input(s): AMMONIA in the last 168 hours. Coagulation Profile: Recent Labs  Lab 11/07/17 1722  INR 0.99   Cardiac Enzymes: No results for input(s): CKTOTAL, CKMB, CKMBINDEX, TROPONINI in the last 168 hours. BNP (last 3 results) No results for input(s): PROBNP in the last 8760 hours. HbA1C: No results for input(s): HGBA1C in the last 72 hours. CBG: No results for input(s): GLUCAP in the last 168 hours. Lipid Profile: No results for  input(s): CHOL, HDL, LDLCALC, TRIG, CHOLHDL, LDLDIRECT in the last 72 hours. Thyroid Function Tests: No results for input(s): TSH, T4TOTAL, FREET4, T3FREE, THYROIDAB in the last 72 hours. Anemia Panel: No results for input(s): VITAMINB12, FOLATE, FERRITIN, TIBC, IRON, RETICCTPCT in the last 72 hours. Urine analysis:    Component Value Date/Time   COLORURINE AMBER (A) 10/17/2015 2311   APPEARANCEUR CLOUDY (A) 10/17/2015 2311   LABSPEC 1.025 10/17/2015 2311   PHURINE 6.0 10/17/2015 2311   GLUCOSEU NEGATIVE 10/17/2015 2311   HGBUR NEGATIVE 10/17/2015 2311   HGBUR trace-lysed 12/30/2009 1201   BILIRUBINUR SMALL (A) 10/17/2015 2311   BILIRUBINUR Neg 05/31/2011 1528   KETONESUR NEGATIVE 10/17/2015 2311   PROTEINUR NEGATIVE 10/17/2015 2311   UROBILINOGEN 2.0 (H) 01/30/2012 1156   NITRITE NEGATIVE 10/17/2015 2311   LEUKOCYTESUR TRACE (A) 10/17/2015 2311   Sepsis Labs: @LABRCNTIP (procalcitonin:4,lacticidven:4) ) Recent Results (from the past 240 hour(s))  Surgical pcr screen     Status: None   Collection Time: 11/07/17 11:10 PM  Result Value Ref Range Status   MRSA, PCR NEGATIVE NEGATIVE Final   Staphylococcus aureus NEGATIVE NEGATIVE Final    Comment: (NOTE) The Xpert SA Assay (FDA approved for NASAL specimens in patients 27 years of age and older), is one component of a comprehensive surveillance program. It is not intended to diagnose infection nor to guide or monitor treatment. Performed at [facility], [place] [address], [place], [place] [address]          Radiology Studies: Dg Chest 1 View  Result Date: 11/07/2017 CLINICAL DATA:  Fall. EXAM: CHEST  1 VIEW COMPARISON:  Chest x-ray dated October 17, 2015. FINDINGS: The heart size and mediastinal contours are within normal limits. Normal pulmonary vascularity. Atherosclerotic calcification of the aortic arch. Bibasilar atelectasis/scarring. No focal consolidation, pleural effusion, or pneumothorax. No acute osseous  abnormality. IMPRESSION: No active disease. Electronically Signed   By: Titus Dubin M.D.   On: 11/07/2017 16:49   Dg Knee 1-2 Views Left  Result Date: 11/07/2017 CLINICAL DATA:  Left knee pain after fall. EXAM: LEFT KNEE - 1-2 VIEW COMPARISON:  None. FINDINGS: No acute fracture or dislocation. No joint effusion. Joint spaces are preserved. Bone mineralization is normal. Vascular calcifications. IMPRESSION: No acute osseous abnormality. Electronically Signed   By: Titus Dubin M.D.   On: 11/07/2017 16:53   Ct Head Wo Contrast  Result Date: 11/07/2017 CLINICAL DATA:  Fall with neck pain EXAM: CT HEAD WITHOUT CONTRAST CT CERVICAL SPINE WITHOUT CONTRAST TECHNIQUE: Multidetector CT imaging of the head and cervical spine was performed following the standard protocol without intravenous contrast. Multiplanar CT image reconstructions of the cervical spine were also generated. COMPARISON:  CTA head/neck 10/18/2015 FINDINGS: CT HEAD FINDINGS Brain: No mass lesion, intraparenchymal hemorrhage or extra-axial collection. No evidence of acute cortical infarct.  There is periventricular hypoattenuation compatible with chronic microvascular disease. Vascular: Atherosclerotic calcification of the vertebral and internal carotid arteries at the skull base. Skull: Normal visualized skull base, calvarium and extracranial soft tissues. Sinuses/Orbits: No sinus fluid levels or advanced mucosal thickening. No mastoid effusion. Normal orbits. CT CERVICAL SPINE FINDINGS Alignment: No static subluxation. Facets are aligned. Occipital condyles are normally positioned. Skull base and vertebrae: No acute fracture. Soft tissues and spinal canal: No prevertebral fluid or swelling. No visible canal hematoma. Disc levels: Moderate left C3-4 facet hypertrophy mild-to-moderate foraminal stenosis. Severe left C4-5 facet hypertrophy and severe foraminal stenosis. C5-C6 uncovertebral hypertrophy with moderate-to-severe bilateral foraminal  stenosis. No bony spinal canal stenosis. Upper chest: No pneumothorax, pulmonary nodule or pleural effusion. Other: Normal visualized paraspinal cervical soft tissues. IMPRESSION: 1. Chronic small vessel disease without acute intracranial abnormality. 2. No acute fracture or static subluxation of the cervical spine. 3. Multilevel neural foraminal stenosis due to combination of facet disease and uncovertebral hypertrophy. Electronically Signed   By: Ulyses Jarred M.D.   On: 11/07/2017 16:43   Ct Cervical Spine Wo Contrast  Result Date: 11/07/2017 CLINICAL DATA:  Fall with neck pain EXAM: CT HEAD WITHOUT CONTRAST CT CERVICAL SPINE WITHOUT CONTRAST TECHNIQUE: Multidetector CT imaging of the head and cervical spine was performed following the standard protocol without intravenous contrast. Multiplanar CT image reconstructions of the cervical spine were also generated. COMPARISON:  CTA head/neck 10/18/2015 FINDINGS: CT HEAD FINDINGS Brain: No mass lesion, intraparenchymal hemorrhage or extra-axial collection. No evidence of acute cortical infarct. There is periventricular hypoattenuation compatible with chronic microvascular disease. Vascular: Atherosclerotic calcification of the vertebral and internal carotid arteries at the skull base. Skull: Normal visualized skull base, calvarium and extracranial soft tissues. Sinuses/Orbits: No sinus fluid levels or advanced mucosal thickening. No mastoid effusion. Normal orbits. CT CERVICAL SPINE FINDINGS Alignment: No static subluxation. Facets are aligned. Occipital condyles are normally positioned. Skull base and vertebrae: No acute fracture. Soft tissues and spinal canal: No prevertebral fluid or swelling. No visible canal hematoma. Disc levels: Moderate left C3-4 facet hypertrophy mild-to-moderate foraminal stenosis. Severe left C4-5 facet hypertrophy and severe foraminal stenosis. C5-C6 uncovertebral hypertrophy with moderate-to-severe bilateral foraminal stenosis. No  bony spinal canal stenosis. Upper chest: No pneumothorax, pulmonary nodule or pleural effusion. Other: Normal visualized paraspinal cervical soft tissues. IMPRESSION: 1. Chronic small vessel disease without acute intracranial abnormality. 2. No acute fracture or static subluxation of the cervical spine. 3. Multilevel neural foraminal stenosis due to combination of facet disease and uncovertebral hypertrophy. Electronically Signed   By: Ulyses Jarred M.D.   On: 11/07/2017 16:43   Dg Hip Unilat W Or Wo Pelvis 2-3 Views Left  Result Date: 11/07/2017 CLINICAL DATA:  Left hip pain after fall. EXAM: DG HIP (WITH OR WITHOUT PELVIS) 2-3V LEFT COMPARISON:  None. FINDINGS: Acute transcervical versus basicervical fracture of the left femoral neck with external rotation. No dislocation. Increased sclerosis within both femoral heads. No subchondral collapse. The bones are osteopenic. The pubic symphysis and sacroiliac joints are intact. Soft tissues are unremarkable. IMPRESSION: 1. Acute left femoral neck fracture.  No dislocation. 2. 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 Electronically Signed   By: Titus Dubin M.D.   On: 11/07/2017 16:52      Scheduled Meds: . atorvastatin  40 mg Oral Q2000  . chlorhexidine  60 mL Topical Once  . enoxaparin (LOVENOX) injection  30 mg Subcutaneous Q24H  . losartan  50 mg Oral Daily  . metoprolol succinate  50 mg Oral Daily  . povidone-iodine  2 application Topical Once  . povidone-iodine  2 application Topical Once  . sodium chloride flush  3 mL Intravenous Q12H  . triamcinolone  2 spray Nasal QPM   Continuous Infusions: . sodium chloride    . clindamycin (CLEOCIN) IV    . tranexamic acid       LOS: 1 day    Time spent in minutes: 35    Debbe Odea, MD [facility] Pager: [email or web address] Password [facility] 11/08/2017, 10:54 AM

## 2017-11-08 NOTE — Op Note (Signed)
HEMI HIP ARTHROPLASTY ANTERIOR APPROACH  Procedure Note [name]   [id number]  Pre-op Diagnosis: left femoral neck fracture     Post-op Diagnosis: same   Operative Procedures  1. Prosthetic replacement for femoral neck fracture. CPT [phone]  Personnel  Surgeon(s): Leandrew Koyanagi, MD  ASSIST: Madalyn Rob, PA-C; necessary for the timely completion of procedure and due to complexity of procedure.   Anesthesia: general  Prosthesis: depuy Femur: corail KA 14 Head: 51 mm size: +1.5 Bearing Type: bipolar  Hip Hemiarthroplasty (Anterior Approach) Op Note:  After informed consent was obtained and the operative extremity marked in the holding area, the patient was brought back to the operating room and placed supine on the HANA table. Next, the operative extremity was prepped and draped in normal sterile fashion. Surgical timeout occurred verifying patient identification, surgical site, surgical procedure and administration of antibiotics.  A modified anterior Smith-Peterson approach to the hip was performed, using the interval between tensor fascia lata and sartorius.  Dissection was carried bluntly down onto the anterior hip capsule. The lateral femoral circumflex vessels were identified and coagulated. A capsulotomy was performed and the capsular flaps tagged for later repair.  Fluoroscopy was utilized to prepare for the femoral neck cut. The neck osteotomy was performed. The femoral head was removed and found a 51 mm head was the appropriate fit.    We then turned our attention to the femur.  After placing the femoral hook, the leg was taken to externally rotated, extended and adducted position taking care to perform soft tissue releases to allow for adequate mobilization of the femur. Soft tissue was cleared from the shoulder of the greater trochanter and the hook elevator used to improve exposure of the proximal femur. Sequential broaching performed up to a size 14. Trial neck  and head were placed. The leg was brought back up to neutral and the construct reduced. The position and sizing of components, offset and leg lengths were checked using fluoroscopy. Stability of the construct was checked in extension and external rotation without any subluxation or impingement of prosthesis. We dislocated the prosthesis, dropped the leg back into position, removed trial components, and irrigated copiously. The final stem and head was then placed, the leg brought back up, the system reduced and fluoroscopy used to verify positioning.  We irrigated, obtained hemostasis and closed the capsule using #2 ethibond suture.  The fascia was closed with #1 vicryl plus, the deep fat layer was closed with 0 vicryl, the subcutaneous layers closed with 2.0 Vicryl Plus and the skin closed with staples. A sterile dressing was applied. The patient was awakened in the operating room and taken to recovery in stable condition. All sponge, needle, and instrument counts were correct at the end of the case.   Position: supine  Complications: none.  Time Out: performed   Drains/Packing: none  Estimated blood loss: 150 cc  Returned to Recovery Room: in good condition.   Antibiotics: yes   Mechanical VTE (DVT) Prophylaxis: sequential compression devices, TED thigh-high  Chemical VTE (DVT) Prophylaxis: lovenox  Fluid Replacement: Crystalloid: see anesthesia record  Specimens Removed: 1 to pathology   Sponge and Instrument Count Correct? yes   PACU: portable radiograph - low AP   Admission: inpatient status, start PT & OT POD#1  Plan/RTC: Return in 2 weeks for staple removal. Return in 6 weeks to see MD.  Weight Bearing/Load Lower Extremity: full  Hip precautions: none Suture Removal: 10-14 days  Betadine to  incision twice daily once dressing is removed on POD#7  N. Eduard Roux, MD [place] 4:41 PM    Implant Name Type Inv. Item Serial No. Manufacturer Lot No.  LRB No. Used  HIP BALL ARTICU DEPUY - [id number] Hips HIP BALL ARTICU DEPUY  DEPUY SYNTHES [id number] Left 1  STEM CORAIL KA14 - [id number] Stem STEM CORAIL KA14  DEPUY SYNTHES [id number] Left 1  BIPOLAR DEPUY 51MM - [id number] Hips BIPOLAR DEPUY 51MM  DEPUY SYNTHES [id number] Left 1

## 2017-11-08 NOTE — Anesthesia Procedure Notes (Addendum)
Procedure Name: Intubation Date/Time: 11/08/2017 3:16 PM Performed by: Bryson Corona, CRNA Pre-anesthesia Checklist: Patient identified, Emergency Drugs available, Suction available and Patient being monitored Patient Re-evaluated:Patient Re-evaluated prior to induction Oxygen Delivery Method: Circle System Utilized Preoxygenation: Pre-oxygenation with 100% oxygen Induction Type: IV induction Ventilation: Two handed mask ventilation required and Oral airway inserted - appropriate to patient size Laryngoscope Size: Mac and 3 Grade View: Grade III Tube type: Oral Tube size: 7.0 mm Number of attempts: 1 Airway Equipment and Method: Stylet and Oral airway Placement Confirmation: ETT inserted through vocal cords under direct vision,  positive ETCO2 and breath sounds checked- equal and bilateral Secured at: 21 cm Tube secured with: Tape Dental Injury: Teeth and Oropharynx as per pre-operative assessment  Difficulty Due To: Difficult Airway- due to anterior larynx, Difficult Airway- due to limited oral opening, Difficult Airway- due to dentition and Difficult Airway- due to reduced neck mobility

## 2017-11-08 NOTE — Anesthesia Preprocedure Evaluation (Signed)
Anesthesia Evaluation  Patient identified by MRN, date of birth, ID band Patient awake    Reviewed: Allergy & Precautions, NPO status , Patient's Chart, lab work & pertinent test results, reviewed documented beta blocker date and time   History of Anesthesia Complications Negative for: history of anesthetic complications  Airway Mallampati: II  TM Distance: >3 FB Neck ROM: Full    Dental  (+) Dental Advisory Given, Chipped   Pulmonary former smoker,    breath sounds clear to auscultation       Cardiovascular hypertension, Pt. on medications and Pt. 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 "cuckoo") negative neurological ROS     GI/Hepatic Neg liver ROS, GERD  Medicated,  Endo/Other  negative endocrine ROS  Renal/GU negative Renal ROS     Musculoskeletal   Abdominal   Peds  Hematology negative hematology ROS (+)   Anesthesia Other Findings   Reproductive/Obstetrics                             Anesthesia Physical Anesthesia Plan  ASA: III  Anesthesia Plan: General   Post-op Pain Management:    Induction: Intravenous  PONV Risk Score and Plan: 2 and Ondansetron and Dexamethasone  Airway Management Planned: Oral ETT  Additional Equipment:   Intra-op Plan:   Post-operative Plan: Extubation in OR  Informed Consent: I have reviewed the patients History and Physical, chart, labs and discussed the procedure including the risks, benefits and alternatives for the proposed anesthesia with the patient or authorized representative who has indicated his/her understanding and acceptance.   Dental advisory given  Plan Discussed with: CRNA and Surgeon  Anesthesia Plan Comments: (Plan routine monitors, GETA)        Anesthesia Quick Evaluation

## 2017-11-08 NOTE — Consult Note (Signed)
ORTHOPAEDIC CONSULTATION  REQUESTING PHYSICIAN: Debbe Odea, MD  Chief Complaint: Left femoral neck hip fracture  HPI: [name] is a 84 y.o. male who presents with left hip fracture s/p mechanical fall PTA.  The patient endorses severe pain in the left hip, that does not radiate, grinding in quality, worse with any movement, better with immobilization.  Denies LOC/fever/chills/nausea/vomiting.  Denies LOC, neck pain, abd pain.  History reviewed. No pertinent past medical history. Past Surgical History:  Procedure Laterality Date  . CATARACT EXTRACTION W/ INTRAOCULAR LENS  IMPLANT, BILATERAL Bilateral   . COLONOSCOPY  2007   Diverticulosis  . CORONARY ANGIOPLASTY WITH STENT PLACEMENT  11/06/2013   "1"  . CYSTOSCOPY W/ RETROGRADES Bilateral 12/26/2012   Procedure: CYSTOSCOPY WITH Bilateral RETROGRADE PYELOGRAM;  Surgeon: Malka So, MD;  Location: [facility];  Service: Urology;  Laterality: Bilateral;  BLADDER BIOPSY  . ERCP  02/01/2012   Procedure: ENDOSCOPIC RETROGRADE CHOLANGIOPANCREATOGRAPHY (ERCP);  Surgeon: Beryle Beams, MD;  Location: [facility];  Service: Endoscopy;  Laterality: N/A;  . ESOPHAGOGASTRODUODENOSCOPY (EGD) WITH PROPOFOL N/A 01/29/2014   Procedure: ESOPHAGOGASTRODUODENOSCOPY (EGD) WITH PROPOFOL;  Surgeon: Inda Castle, MD;  Location: [facility];  Service: Endoscopy;  Laterality: N/A;  . FULGURATION OF BLADDER TUMOR N/A 12/26/2012   Procedure: Multiple Bladder Biopsy;  Surgeon: Malka So, MD;  Location[facility];  Service: Urology;  Laterality: N/A;  . LAPAROSCOPIC CHOLECYSTECTOMY  06-27-2007   Dr Johnathan Hausen  . LEFT HEART CATHETERIZATION WITH CORONARY ANGIOGRAM N/A 11/06/2013   Procedure: LEFT HEART CATHETERIZATION WITH CORONARY ANGIOGRAM;  Surgeon: Clent Demark, MD;  Location: [facility];  Service: Cardiovascular;  Laterality: N/A;  . TONSILLECTOMY    . TRANSURETHRAL RESECTION OF BLADDER TUMOR  07/27/2011   Procedure: TRANSURETHRAL RESECTION OF BLADDER TUMOR (TURBT);  Surgeon: Malka So;  Location: [facility];  Service: Urology;  Laterality: N/A;  Cystoscopy/Transurethral Resection of Bladder Tumor   Social History   Socioeconomic History  . Marital status: Married    Spouse name: [name]  . Number of children: 3  . Years of education: Not on file  . Highest education level: Not on file  Occupational History  . 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 Financial resource strain: Not on file  . Food insecurity:    Worry: Not on file    Inability: Not on file  . Transportation needs:    Medical: Not on file    Non-medical: Not on file  Tobacco Use  . Smoking status: Former Smoker    Packs/day: 1.00    Years: 31.00    Pack years: 31.00    Types: Cigarettes    Last attempt to quit: 08/21/1973    Years since quitting: 44.2  . Smokeless tobacco: Never Used  . Tobacco comment: smoked  1944-1975 , up to 1 ppd  Substance and Sexual Activity  . Alcohol use: Yes    Alcohol/week: 1.2 oz    Types: 2 Glasses of wine per week    Comment: 11/06/2013 "glass of wine maybe twice/wk"  . Drug use: No  . Sexual activity: Never  Lifestyle  . Physical activity:    Days per week: Not on file    Minutes per session: Not on file  . Stress: Not on file  Relationships  . Social connections:    Talks on phone: Not on file    Gets together: Not on file  Attends religious service: Not on file    Active member of club or organization: Not on file    Attends meetings of clubs or organizations: Not on file    Relationship status: Not on file  Other Topics Concern  . Not on file  Social History Narrative   No special diet.   No regular exercise   Family History  Problem Relation Age of Onset  . Heart attack Father 42  . Colon cancer Mother 61  . Hypertension Mother   . Breast cancer Maternal Grandmother   . Heart attack Maternal Grandfather        late 41s  . Heart failure  Sister   . Diabetes Neg Hx    Allergies  Allergen Reactions  . Morphine And Related Other (See Comments)    Chest pain   . Cephalexin Other (See Comments)    Bloody loose stools   Prior to Admission medications   Medication Sig Start Date End Date Taking? Authorizing Provider  aspirin EC 81 MG tablet Take 81 mg by mouth daily.   Yes [provider]  atorvastatin (LIPITOR) 40 MG tablet Take 40 mg by mouth daily. 08/20/15  Yes [provider]  CALCIUM PO Take 1 tablet by mouth every evening.    Yes [provider]  losartan (COZAAR) 100 MG tablet Take 50 mg by mouth daily. 10/13/17  Yes [provider]  metoprolol succinate (TOPROL-XL) 50 MG 24 hr tablet Take 50 mg by mouth daily. Take with or immediately following a meal.  12/19/13  Yes Hendricks Limes, MD  triamcinolone (NASACORT ALLERGY 24HR) 55 MCG/ACT AERO nasal inhaler Place 2 sprays into the nose every evening.    Yes [provider]  acetaminophen (TYLENOL 8 HOUR) 650 MG CR tablet Take 1 tablet (650 mg total) by mouth every 8 (eight) hours as needed for pain. Patient not taking: Reported on 11/07/2017 10/08/15   Charolette Forward, MD  bisacodyl (DULCOLAX) 5 MG EC tablet Take 1 tablet (5 mg total) by mouth daily as needed for moderate constipation. Patient not taking: Reported on 11/07/2017 10/08/15   Charolette Forward, MD  ciprofloxacin (CIPRO) 500 MG tablet Take 1 tablet (500 mg total) by mouth 2 (two) times daily. Patient not taking: Reported on 11/07/2017 10/19/15   Caren Griffins, MD  nitroGLYCERIN (NITROSTAT) 0.4 MG SL tablet Place 1 tablet (0.4 mg total) under the tongue every 5 (five) minutes x 3 doses as needed for chest pain. 11/08/13   Charolette Forward, MD  omeprazole (PRILOSEC) 40 MG capsule Take 1 capsule (40 mg total) by mouth daily. Patient not taking: Reported on 11/07/2017 01/31/16   Esterwood, Amy S, PA-C  metoprolol (LOPRESSOR) 50 MG tablet TAKE ONE-HALF TABLET BY MOUTH TWICE DAILY  11/24/12 06/25/13  Hendricks Limes, MD  potassium chloride SA (K-DUR,KLOR-CON) 20 MEQ tablet Take 2 tablets (40 mEq total) by mouth 2 (two) times daily. 08/07/11 11/06/11  Nita Sells, MD  zolpidem (AMBIEN) 5 MG tablet Take 1 tablet (5 mg total) by mouth at bedtime as needed for sleep. 08/07/11 11/06/11  Nita Sells, MD   Dg Chest 1 View  Result Date: 11/07/2017 CLINICAL DATA:  Fall. EXAM: CHEST  1 VIEW COMPARISON:  Chest x-ray dated October 17, 2015. FINDINGS: The heart size and mediastinal contours are within normal limits. Normal pulmonary vascularity. Atherosclerotic calcification of the aortic arch. Bibasilar atelectasis/scarring. No focal consolidation, pleural effusion, or pneumothorax. No acute osseous abnormality. IMPRESSION: No active disease. Electronically Signed  By: Titus Dubin M.D.   On: 11/07/2017 16:49   Dg Knee 1-2 Views Left  Result Date: 11/07/2017 CLINICAL DATA:  Left knee pain after fall. EXAM: LEFT KNEE - 1-2 VIEW COMPARISON:  None. FINDINGS: No acute fracture or dislocation. No joint effusion. Joint spaces are preserved. Bone mineralization is normal. Vascular calcifications. IMPRESSION: No acute osseous abnormality. Electronically Signed   By: Titus Dubin M.D.   On: 11/07/2017 16:53   Ct Head Wo Contrast  Result Date: 11/07/2017 CLINICAL DATA:  Fall with neck pain EXAM: CT HEAD WITHOUT CONTRAST CT CERVICAL SPINE WITHOUT CONTRAST TECHNIQUE: Multidetector CT imaging of the head and cervical spine was performed following the standard protocol without intravenous contrast. Multiplanar CT image reconstructions of the cervical spine were also generated. COMPARISON:  CTA head/neck 10/18/2015 FINDINGS: CT HEAD FINDINGS Brain: No mass lesion, intraparenchymal hemorrhage or extra-axial collection. No evidence of acute cortical infarct. There is periventricular hypoattenuation compatible with chronic microvascular disease. Vascular: Atherosclerotic calcification  of the vertebral and internal carotid arteries at the skull base. Skull: Normal visualized skull base, calvarium and extracranial soft tissues. Sinuses/Orbits: No sinus fluid levels or advanced mucosal thickening. No mastoid effusion. Normal orbits. CT CERVICAL SPINE FINDINGS Alignment: No static subluxation. Facets are aligned. Occipital condyles are normally positioned. Skull base and vertebrae: No acute fracture. Soft tissues and spinal canal: No prevertebral fluid or swelling. No visible canal hematoma. Disc levels: Moderate left C3-4 facet hypertrophy mild-to-moderate foraminal stenosis. Severe left C4-5 facet hypertrophy and severe foraminal stenosis. C5-C6 uncovertebral hypertrophy with moderate-to-severe bilateral foraminal stenosis. No bony spinal canal stenosis. Upper chest: No pneumothorax, pulmonary nodule or pleural effusion. Other: Normal visualized paraspinal cervical soft tissues. IMPRESSION: 1. Chronic small vessel disease without acute intracranial abnormality. 2. No acute fracture or static subluxation of the cervical spine. 3. Multilevel neural foraminal stenosis due to combination of facet disease and uncovertebral hypertrophy. Electronically Signed   By: Ulyses Jarred M.D.   On: 11/07/2017 16:43   Ct Cervical Spine Wo Contrast  Result Date: 11/07/2017 CLINICAL DATA:  Fall with neck pain EXAM: CT HEAD WITHOUT CONTRAST CT CERVICAL SPINE WITHOUT CONTRAST TECHNIQUE: Multidetector CT imaging of the head and cervical spine was performed following the standard protocol without intravenous contrast. Multiplanar CT image reconstructions of the cervical spine were also generated. COMPARISON:  CTA head/neck 10/18/2015 FINDINGS: CT HEAD FINDINGS Brain: No mass lesion, intraparenchymal hemorrhage or extra-axial collection. No evidence of acute cortical infarct. There is periventricular hypoattenuation compatible with chronic microvascular disease. Vascular: Atherosclerotic calcification of the vertebral  and internal carotid arteries at the skull base. Skull: Normal visualized skull base, calvarium and extracranial soft tissues. Sinuses/Orbits: No sinus fluid levels or advanced mucosal thickening. No mastoid effusion. Normal orbits. CT CERVICAL SPINE FINDINGS Alignment: No static subluxation. Facets are aligned. Occipital condyles are normally positioned. Skull base and vertebrae: No acute fracture. Soft tissues and spinal canal: No prevertebral fluid or swelling. No visible canal hematoma. Disc levels: Moderate left C3-4 facet hypertrophy mild-to-moderate foraminal stenosis. Severe left C4-5 facet hypertrophy and severe foraminal stenosis. C5-C6 uncovertebral hypertrophy with moderate-to-severe bilateral foraminal stenosis. No bony spinal canal stenosis. Upper chest: No pneumothorax, pulmonary nodule or pleural effusion. Other: Normal visualized paraspinal cervical soft tissues. IMPRESSION: 1. Chronic small vessel disease without acute intracranial abnormality. 2. No acute fracture or static subluxation of the cervical spine. 3. Multilevel neural foraminal stenosis due to combination of facet disease and uncovertebral hypertrophy. Electronically Signed   By: Cletus Gash.D.  On: 11/07/2017 16:43   Dg Hip Unilat W Or Wo Pelvis 2-3 Views Left  Result Date: 11/07/2017 CLINICAL DATA:  Left hip pain after fall. EXAM: DG HIP (WITH OR WITHOUT PELVIS) 2-3V LEFT COMPARISON:  None. FINDINGS: Acute transcervical versus basicervical fracture of the left femoral neck with external rotation. No dislocation. Increased sclerosis within both femoral heads. No subchondral collapse. The bones are osteopenic. The pubic symphysis and sacroiliac joints are intact. Soft tissues are unremarkable. IMPRESSION: 1. Acute left femoral neck fracture.  No dislocation. 2. 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 Electronically Signed   By: Titus Dubin M.D.   On: 11/07/2017 16:52    All pertinent xrays, MRI, CT  independently reviewed and interpreted  Positive ROS: All other systems have been reviewed and were otherwise negative with the exception of those mentioned in the HPI and as above.  Physical Exam: General: Alert, no acute distress Cardiovascular: No pedal edema Respiratory: No cyanosis, no use of accessory musculature GI: No organomegaly, abdomen is soft and non-tender Skin: No lesions in the area of chief complaint Neurologic: Sensation intact distally Psychiatric: Patient is competent for consent with normal mood and affect Lymphatic: No axillary or cervical lymphadenopathy  MUSCULOSKELETAL:  - pain with movement of the hip and extremity - skin intact - NVI distally - compartments soft  Assessment: Left femoral neck hip fracture  Plan: - partial hip replacement is recommended to allow for pain, quality of life and early ambulation, patient and family are aware of r/b/a and wish to proceed - consent obtained - medical optimization per primary team - surgery is planned for Thursday afternoon  Thank you for the consult and the opportunity to see Mr. Benjamin Jun. Eduard Roux, MD [place] [phone] 2:28 PM

## 2017-11-08 NOTE — Transfer of Care (Signed)
Immediate Anesthesia Transfer of Care Note  Patient: [name]  Procedure(s) Performed: HEMI HIP ARTHROPLASTY ANTERIOR APPROACH (Left Hip)  Patient Location: PACU  Anesthesia Type:General  Level of Consciousness: drowsy and patient cooperative  Airway & Oxygen Therapy: Patient Spontanous Breathing and Patient connected to nasal cannula oxygen  Post-op Assessment: Report given to RN and Post -op Vital signs reviewed and stable  Post vital signs: Reviewed and stable  Last Vitals:  Vitals Value Taken Time  BP 125/70 11/08/2017  5:11 PM  Temp    Pulse 83 11/08/2017  5:13 PM  Resp 21 11/08/2017  5:13 PM  SpO2 99 % 11/08/2017  5:13 PM  Vitals shown include unvalidated device data.  Last Pain:  Vitals:   11/08/17 1230  TempSrc:   PainSc: 6       Patients Stated Pain Goal: 2 (24/82/50 0370)  Complications: No apparent anesthesia complications

## 2017-11-09 ENCOUNTER — Encounter (HOSPITAL_COMMUNITY): Payer: Self-pay

## 2017-11-09 ENCOUNTER — Inpatient Hospital Stay (HOSPITAL_COMMUNITY): Payer: Medicare Other

## 2017-11-09 DIAGNOSIS — R0989 Other specified symptoms and signs involving the circulatory and respiratory systems: Secondary | ICD-10-CM

## 2017-11-09 DIAGNOSIS — R7989 Other specified abnormal findings of blood chemistry: Secondary | ICD-10-CM

## 2017-11-09 DIAGNOSIS — F0391 Unspecified dementia with behavioral disturbance: Secondary | ICD-10-CM

## 2017-11-09 LAB — BASIC METABOLIC PANEL
ANION GAP: 11 (ref 5–15)
BUN: 15 mg/dL (ref 6–20)
CALCIUM: 8.1 mg/dL — AB (ref 8.9–10.3)
CO2: 21 mmol/L — AB (ref 22–32)
Chloride: 101 mmol/L (ref 101–111)
Creatinine, Ser: 1.28 mg/dL — ABNORMAL HIGH (ref 0.61–1.24)
GFR calc non Af Amer: 48 mL/min — ABNORMAL LOW (ref >60)
GFR, EST AFRICAN AMERICAN: 55 mL/min — AB (ref >60)
GLUCOSE: 161 mg/dL — AB (ref 65–99)
POTASSIUM: 3.9 mmol/L (ref 3.5–5.1)
Sodium: 133 mmol/L — ABNORMAL LOW (ref 135–145)

## 2017-11-09 LAB — CBC
HEMATOCRIT: 43.4 % (ref 39.0–52.0)
HEMOGLOBIN: 14 g/dL (ref 13.0–17.0)
MCH: 31.2 pg (ref 26.0–34.0)
MCHC: 32.3 g/dL (ref 30.0–36.0)
MCV: 96.7 fL (ref 78.0–100.0)
Platelets: 140 10*3/uL — ABNORMAL LOW (ref 150–400)
RBC: 4.49 MIL/uL (ref 4.22–5.81)
RDW: 13.9 % (ref 11.5–15.5)
WBC: 12.3 10*3/uL — AB (ref 4.0–10.5)

## 2017-11-09 NOTE — Progress Notes (Signed)
Per report this am, pt with safety sitter.  Pt is currently up to chair with wife at chair side.  Pt is alert, oriented, appropriate and following commands this shift.  Wife agrees that pt is at bedside.  MD notified and safety sitter dc'd at this time.  [id number]

## 2017-11-09 NOTE — Progress Notes (Signed)
Subjective: 1 Day Post-Op Procedure(s) (LRB): HEMI HIP ARTHROPLASTY ANTERIOR APPROACH (Left) Patient reports pain as mild.  Comfortably sitting in bed eating breakfast  Objective: Vital signs in last 24 hours: Temp:  [97 F (36.1 C)-98.3 F (36.8 C)] 98.3 F (36.8 C) (03/22 0534) Pulse Rate:  [81-94] 92 (03/22 0534) Resp:  [14-22] 22 (03/22 0534) BP: (125-138)/(49-70) 138/67 (03/22 0534) SpO2:  [92 %-97 %] 95 % (03/22 0534) Weight:  [175 lb (79.4 kg)] 175 lb (79.4 kg) (03/21 1314)  Intake/Output from previous day: 03/21 0701 - 03/22 0700 In: 2450 [P.O.:100; I.V.:2350] Out: 900 [Urine:800; Blood:100] Intake/Output this shift: No intake/output data recorded.  Recent Labs    11/07/17 1722 11/08/17 0820 11/09/17 0600  HGB 16.7 16.1 14.0   Recent Labs    11/08/17 0820 11/09/17 0600  WBC 10.8* 12.3*  RBC 4.91 4.49  HCT 47.2 43.4  PLT 133* 140*   Recent Labs    11/08/17 0820 11/09/17 0600  NA 136 133*  K 4.3 3.9  CL 103 101  CO2 25 21*  BUN 12 15  CREATININE 1.12 1.28*  GLUCOSE 133* 161*  CALCIUM 8.8* 8.1*   Recent Labs    11/07/17 1722  INR 0.99    Neurologically intact Neurovascular intact Sensation intact distally Intact pulses distally Dorsiflexion/Plantar flexion intact Incision: dressing C/D/I No cellulitis present Compartment soft  Assessment/Plan: 1 Day Post-Op Procedure(s) (LRB): HEMI HIP ARTHROPLASTY ANTERIOR APPROACH (Left) Advance diet Up with therapy  Dry dressing change prn WBAT LLE Continue plan per hospitalist  [name] 11/09/2017, 7:54 AM

## 2017-11-09 NOTE — Anesthesia Postprocedure Evaluation (Signed)
Anesthesia Post Note  Patient: [name]  Procedure(s) Performed: HEMI HIP ARTHROPLASTY ANTERIOR APPROACH (Left Hip)     Patient location during evaluation: PACU Anesthesia Type: General Level of consciousness: awake and alert Pain management: pain level controlled Vital Signs Assessment: post-procedure vital signs reviewed and stable Respiratory status: spontaneous breathing, nonlabored ventilation and respiratory function stable Cardiovascular status: blood pressure returned to baseline and stable Postop Assessment: no apparent nausea or vomiting Anesthetic complications: no    Last Vitals:  Vitals Value Taken Time  BP    Temp    Pulse    Resp    SpO2      Last Pain:  Vitals:   11/09/17 0534  TempSrc: Oral  PainSc:                  Brodric Schauer,W. EDMOND

## 2017-11-09 NOTE — Progress Notes (Signed)
PROGRESS NOTE    [name]   NFA:[id number]  DOB: 01/08/1926  DOA: 11/07/2017 PCP: Charolette Forward, MD   Brief Narrative:  [name] is a 84 y.o. male, with past medical history significant for coronary artery disease status post angioplasty and stent placement, dementia who lives at home with wife presented status post fall with left hip pain and is found to have a left hip fracture. Evaluated by Ortho. Plans for left hip hemiarthroplasty.   Subjective: His main complaint is pain in left hip. ROS: no complaints of nausea, vomiting, constipation diarrhea, cough, dyspnea or dysuria. No other complaints.   Assessment & Plan:   Active Problems: Left Hip fracture  - per ortho - 3/12 underwent left him hemiarthroplasty - plan to go home with home health per PT eval - DVT prophylaxis: Lovenox (spoke with wife who is ok with doing Lovenox)  -further ortho recommendations:  Return in 2 weeks for staple removal. Return in 6 weeks to see MD.  Weight Bearing/Load Lower Extremity: full  Hip precautions: none Suture Removal: 10-14 days  Betadine to incision twice daily once dressing is removed on POD#7  H/o CAD, HTN - on Lipitor, Metoprolol, Cozaar as he takes at home- follow BP - ASA 81 mg on hold - no noted chest pain or dyspnea recently by patient of wife- EKG shows NSR  AKI - post op- follow- has coarse crackles at bases-  CXR shows only bilateral atelectasis - hold Cozaar  Grade 1dCHF per ECHO in 1/17 - follow fluid status  Mild acute thrombocytopenia - follow  Mild dementia - wife is a better historian - had sitter for behavioral disturbances on 3/21- he is much more calm today and therefore will d/c sitter.  DVT prophylaxis: per ortho Code Status: Full code Family Communication: with his wife Disposition Plan: f/u post op needs Consultants:   ortho Procedures:    Antimicrobials:  Anti-infectives (From admission, onward)   Start     Dose/Rate Route  Frequency Ordered Stop   11/09/17 0600  vancomycin (VANCOCIN) IVPB 1000 mg/200 mL premix  Status:  Discontinued     1,000 mg 200 mL/hr over 60 Minutes Intravenous On call to O.R. 11/08/17 1806 11/08/17 1836   11/09/17 0400  vancomycin (VANCOCIN) IVPB 1000 mg/200 mL premix     1,000 mg 200 mL/hr over 60 Minutes Intravenous Every 12 hours 11/08/17 1806 11/10/17 0359   11/08/17 1642  vancomycin (VANCOCIN) powder  Status:  Discontinued       As needed 11/08/17 1642 11/08/17 1711   11/08/17 1400  clindamycin (CLEOCIN) IVPB 900 mg     900 mg 100 mL/hr over 30 Minutes Intravenous To Surgery 11/07/17 2241 11/08/17 1548   11/08/17 0600  clindamycin (CLEOCIN) IVPB 900 mg  Status:  Discontinued     900 mg 100 mL/hr over 30 Minutes Intravenous On call to O.R. 11/07/17 2241 11/07/17 2245       Objective: Vitals:   11/08/17 1809 11/08/17 1811 11/08/17 2044 11/09/17 0534  BP:  138/66 132/70 138/67  Pulse: 82 82 94 92  Resp: 20 (!) 22 20 (!) 22  Temp: (!) 97 F (36.1 C)  (!) 97.3 F (36.3 C) 98.3 F (36.8 C)  TempSrc:    Oral  SpO2: 93% 93% 92% 95%  Weight:      Height:        Intake/Output Summary (Last 24 hours) at 11/09/2017 1047 Last data filed at 11/09/2017 0809 Gross per 24 hour  Intake 2690 ml  Output 700 ml  Net 1990 ml   Filed Weights   11/07/17 1531 11/08/17 1314  Weight: 79.4 kg (175 lb) 79.4 kg (175 lb)    Examination: General exam: Appears comfortable  HEENT: PERRLA, oral mucosa moist, no sclera icterus or thrush Respiratory system: Clear to auscultation. Respiratory effort normal. Cardiovascular system: S1 & S2 heard, RRR.  No murmurs  Gastrointestinal system: Abdomen soft, non-tender, nondistended. Normal bowel sound. No organomegaly Central nervous system: Alert and oriented. No focal neurological deficits. Extremities: No cyanosis, clubbing or edema Skin: No rashes or ulcers Psychiatry:  Mood & affect appropriate.     Data Reviewed: I have personally  reviewed following labs and imaging studies  CBC: Recent Labs  Lab 11/07/17 1722 11/08/17 0820 11/09/17 0600  WBC 9.6 10.8* 12.3*  NEUTROABS 7.8*  --   --   HGB 16.7 16.1 14.0  HCT 51.3 47.2 43.4  MCV 97.9 96.1 96.7  PLT 146* 133* 875*   Basic Metabolic Panel: Recent Labs  Lab 11/07/17 1722 11/08/17 0820 11/09/17 0600  NA 136 136 133*  K 4.3 4.3 3.9  CL 101 103 101  CO2 24 25 21*  GLUCOSE 99 133* 161*  BUN 9 12 15   CREATININE 0.97 1.12 1.28*  CALCIUM 8.8* 8.8* 8.1*   GFR: Estimated Creatinine Clearance: 39.6 mL/min (A) (by C-G formula based on SCr of 1.28 mg/dL (H)). Liver Function Tests: No results for input(s): AST, ALT, ALKPHOS, BILITOT, PROT, ALBUMIN in the last 168 hours. No results for input(s): LIPASE, AMYLASE in the last 168 hours. No results for input(s): AMMONIA in the last 168 hours. Coagulation Profile: Recent Labs  Lab 11/07/17 1722  INR 0.99   Cardiac Enzymes: No results for input(s): CKTOTAL, CKMB, CKMBINDEX, TROPONINI in the last 168 hours. BNP (last 3 results) No results for input(s): PROBNP in the last 8760 hours. HbA1C: No results for input(s): HGBA1C in the last 72 hours. CBG: No results for input(s): GLUCAP in the last 168 hours. Lipid Profile: No results for input(s): CHOL, HDL, LDLCALC, TRIG, CHOLHDL, LDLDIRECT in the last 72 hours. Thyroid Function Tests: No results for input(s): TSH, T4TOTAL, FREET4, T3FREE, THYROIDAB in the last 72 hours. Anemia Panel: No results for input(s): VITAMINB12, FOLATE, FERRITIN, TIBC, IRON, RETICCTPCT in the last 72 hours. Urine analysis:    Component Value Date/Time   COLORURINE AMBER (A) 10/17/2015 2311   APPEARANCEUR CLOUDY (A) 10/17/2015 2311   LABSPEC 1.025 10/17/2015 2311   PHURINE 6.0 10/17/2015 2311   GLUCOSEU NEGATIVE 10/17/2015 2311   HGBUR NEGATIVE 10/17/2015 2311   HGBUR trace-lysed 12/30/2009 1201   BILIRUBINUR SMALL (A) 10/17/2015 2311   BILIRUBINUR Neg 05/31/2011 1528   KETONESUR  NEGATIVE 10/17/2015 2311   PROTEINUR NEGATIVE 10/17/2015 2311   UROBILINOGEN 2.0 (H) 01/30/2012 1156   NITRITE NEGATIVE 10/17/2015 2311   LEUKOCYTESUR TRACE (A) 10/17/2015 2311   Sepsis Labs: @LABRCNTIP (procalcitonin:4,lacticidven:4) ) Recent Results (from the past 240 hour(s))  Surgical pcr screen     Status: None   Collection Time: 11/07/17 11:10 PM  Result Value Ref Range Status   MRSA, PCR NEGATIVE NEGATIVE Final   Staphylococcus aureus NEGATIVE NEGATIVE Final    Comment: (NOTE) The Xpert SA Assay (FDA approved for NASAL specimens in patients 68 years of age and older), is one component of a comprehensive surveillance program. It is not intended to diagnose infection nor to guide or monitor treatment. Performed at [facility], [place] [address] [place], [place]  [address]          Radiology Studies: Dg Chest 1 View  Result Date: 11/07/2017 CLINICAL DATA:  Fall. EXAM: CHEST  1 VIEW COMPARISON:  Chest x-ray dated October 17, 2015. FINDINGS: The heart size and mediastinal contours are within normal limits. Normal pulmonary vascularity. Atherosclerotic calcification of the aortic arch. Bibasilar atelectasis/scarring. No focal consolidation, pleural effusion, or pneumothorax. No acute osseous abnormality. IMPRESSION: No active disease. Electronically Signed   By: Titus Dubin M.D.   On: 11/07/2017 16:49   Dg Knee 1-2 Views Left  Result Date: 11/07/2017 CLINICAL DATA:  Left knee pain after fall. EXAM: LEFT KNEE - 1-2 VIEW COMPARISON:  None. FINDINGS: No acute fracture or dislocation. No joint effusion. Joint spaces are preserved. Bone mineralization is normal. Vascular calcifications. IMPRESSION: No acute osseous abnormality. Electronically Signed   By: Titus Dubin M.D.   On: 11/07/2017 16:53   Ct Head Wo Contrast  Result Date: 11/07/2017 CLINICAL DATA:  Fall with neck pain EXAM: CT HEAD WITHOUT CONTRAST CT CERVICAL SPINE WITHOUT CONTRAST TECHNIQUE: Multidetector CT  imaging of the head and cervical spine was performed following the standard protocol without intravenous contrast. Multiplanar CT image reconstructions of the cervical spine were also generated. COMPARISON:  CTA head/neck 10/18/2015 FINDINGS: CT HEAD FINDINGS Brain: No mass lesion, intraparenchymal hemorrhage or extra-axial collection. No evidence of acute cortical infarct. There is periventricular hypoattenuation compatible with chronic microvascular disease. Vascular: Atherosclerotic calcification of the vertebral and internal carotid arteries at the skull base. Skull: Normal visualized skull base, calvarium and extracranial soft tissues. Sinuses/Orbits: No sinus fluid levels or advanced mucosal thickening. No mastoid effusion. Normal orbits. CT CERVICAL SPINE FINDINGS Alignment: No static subluxation. Facets are aligned. Occipital condyles are normally positioned. Skull base and vertebrae: No acute fracture. Soft tissues and spinal canal: No prevertebral fluid or swelling. No visible canal hematoma. Disc levels: Moderate left C3-4 facet hypertrophy mild-to-moderate foraminal stenosis. Severe left C4-5 facet hypertrophy and severe foraminal stenosis. C5-C6 uncovertebral hypertrophy with moderate-to-severe bilateral foraminal stenosis. No bony spinal canal stenosis. Upper chest: No pneumothorax, pulmonary nodule or pleural effusion. Other: Normal visualized paraspinal cervical soft tissues. IMPRESSION: 1. Chronic small vessel disease without acute intracranial abnormality. 2. No acute fracture or static subluxation of the cervical spine. 3. Multilevel neural foraminal stenosis due to combination of facet disease and uncovertebral hypertrophy. Electronically Signed   By: Ulyses Jarred M.D.   On: 11/07/2017 16:43   Ct Cervical Spine Wo Contrast  Result Date: 11/07/2017 CLINICAL DATA:  Fall with neck pain EXAM: CT HEAD WITHOUT CONTRAST CT CERVICAL SPINE WITHOUT CONTRAST TECHNIQUE: Multidetector CT imaging of the  head and cervical spine was performed following the standard protocol without intravenous contrast. Multiplanar CT image reconstructions of the cervical spine were also generated. COMPARISON:  CTA head/neck 10/18/2015 FINDINGS: CT HEAD FINDINGS Brain: No mass lesion, intraparenchymal hemorrhage or extra-axial collection. No evidence of acute cortical infarct. There is periventricular hypoattenuation compatible with chronic microvascular disease. Vascular: Atherosclerotic calcification of the vertebral and internal carotid arteries at the skull base. Skull: Normal visualized skull base, calvarium and extracranial soft tissues. Sinuses/Orbits: No sinus fluid levels or advanced mucosal thickening. No mastoid effusion. Normal orbits. CT CERVICAL SPINE FINDINGS Alignment: No static subluxation. Facets are aligned. Occipital condyles are normally positioned. Skull base and vertebrae: No acute fracture. Soft tissues and spinal canal: No prevertebral fluid or swelling. No visible canal hematoma. Disc levels: Moderate left C3-4 facet hypertrophy mild-to-moderate foraminal stenosis. Severe left C4-5 facet hypertrophy and  severe foraminal stenosis. C5-C6 uncovertebral hypertrophy with moderate-to-severe bilateral foraminal stenosis. No bony spinal canal stenosis. Upper chest: No pneumothorax, pulmonary nodule or pleural effusion. Other: Normal visualized paraspinal cervical soft tissues. IMPRESSION: 1. Chronic small vessel disease without acute intracranial abnormality. 2. No acute fracture or static subluxation of the cervical spine. 3. Multilevel neural foraminal stenosis due to combination of facet disease and uncovertebral hypertrophy. Electronically Signed   By: Ulyses Jarred M.D.   On: 11/07/2017 16:43   Dg Pelvis Portable  Result Date: 11/09/2017 CLINICAL DATA:  Hip replacement. EXAM: PORTABLE PELVIS 1-2 VIEWS COMPARISON:  11/08/2017. FINDINGS: Total left hip replacement scratched it left hip replacement. Hardware  intact. Anatomic alignment. No acute bony abnormality. IMPRESSION: Left hip replacement.  Anatomic alignment. Electronically Signed   By: Marcello Moores  Register   On: 11/09/2017 09:58   Dg C-arm 1-60 Min  Result Date: 11/08/2017 CLINICAL DATA:  Intraoperative radiographs from left hip hemiarthroplasty. EXAM: DG C-ARM 61-120 MIN; OPERATIVE LEFT HIP WITH PELVIS COMPARISON:  11/07/2017 FINDINGS: Three intraoperative radiographs from left hip hemiarthroplasty demonstrate placement of prosthetic femoral head with long stem intramedullary femoral component. No evidence of fractures. Fluoroscopy time is recorded as 20 seconds. IMPRESSION: Intraoperative radiographs from left hip hemiarthroplasty without evidence of immediate complications. Electronically Signed   By: Fidela Salisbury M.D.   On: 11/08/2017 16:54   Dg Hip Operative Unilat W Or W/o Pelvis Left  Result Date: 11/08/2017 CLINICAL DATA:  Intraoperative radiographs from left hip hemiarthroplasty. EXAM: DG C-ARM 61-120 MIN; OPERATIVE LEFT HIP WITH PELVIS COMPARISON:  11/07/2017 FINDINGS: Three intraoperative radiographs from left hip hemiarthroplasty demonstrate placement of prosthetic femoral head with long stem intramedullary femoral component. No evidence of fractures. Fluoroscopy time is recorded as 20 seconds. IMPRESSION: Intraoperative radiographs from left hip hemiarthroplasty without evidence of immediate complications. Electronically Signed   By: Fidela Salisbury M.D.   On: 11/08/2017 16:54   Dg Hip Unilat W Or Wo Pelvis 2-3 Views Left  Result Date: 11/07/2017 CLINICAL DATA:  Left hip pain after fall. EXAM: DG HIP (WITH OR WITHOUT PELVIS) 2-3V LEFT COMPARISON:  None. FINDINGS: Acute transcervical versus basicervical fracture of the left femoral neck with external rotation. No dislocation. Increased sclerosis within both femoral heads. No subchondral collapse. The bones are osteopenic. The pubic symphysis and sacroiliac joints are intact. Soft  tissues are unremarkable. IMPRESSION: 1. Acute left femoral neck fracture.  No dislocation. 2. 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 Electronically Signed   By: Titus Dubin M.D.   On: 11/07/2017 16:52      Scheduled Meds: . atorvastatin  40 mg Oral Q2000  . docusate sodium  100 mg Oral BID  . enoxaparin (LOVENOX) injection  40 mg Subcutaneous Q24H  . metoprolol succinate  50 mg Oral Daily  . sodium chloride flush  3 mL Intravenous Q12H  . triamcinolone  2 spray Nasal QPM   Continuous Infusions: . sodium chloride    . sodium chloride 125 mL/hr at 11/08/17 1831  . lactated ringers 10 mL/hr at 11/08/17 1318  . vancomycin 1,000 mg (11/09/17 0316)     LOS: 2 days    Time spent in minutes: 35    Debbe Odea, MD [facility] Pager: [email or web address] Password [facility] 11/09/2017, 10:47 AM

## 2017-11-09 NOTE — Evaluation (Signed)
Physical Therapy Evaluation Patient Details Name: [name] MRN: [id number] DOB: 20-Jan-1926 Today's Date: 11/09/2017   History of Present Illness  Pt is a 84 y.o. male, with past medical history significant for coronary artery disease status post angioplasty and stent placement, and dementia.  He lives at home with wife.  He presented status post fall with left hip pain and was found to have a left hip fracture.  He underwent L hip hemiarthroplasty 11-08-17.     Clinical Impression  Pt admitted with above diagnosis. Pt currently with functional limitations due to the deficits listed below (see PT Problem List). PTA pt lived at home with his wife, daughter and grandson. He was independent with functional mobility in the house. He only leaves the house for MD appts and utilizes a cane for ambulation in the community. He does has a h/o dementia and functions better in a familiar environment. On eval, he required mod assist bed mobility, mod assist transfers +2 safety, and min assist ambulation +2 safety 5 feet with RW. Pt will benefit from skilled PT to increase their independence and safety with mobility to allow discharge to the venue listed below.  Wife's desire is to take pt home, primarily due to his dementia and ability to function best in his home environment. She reports herself and grandson are able to provide 24-hour assist. Pt is an excellent candidate for [facility] program.      Follow Up Recommendations Home health PT;Supervision/Assistance - 24 hour(Pt is an excellent candidate for [facility] program. )    Equipment Recommendations  None recommended by PT    Recommendations for Other Services       Precautions / Restrictions Precautions Precautions: Fall Restrictions Weight Bearing Restrictions: Yes LLE Weight Bearing: Weight bearing as tolerated      Mobility  Bed Mobility Overal bed mobility: Needs Assistance Bed Mobility: Supine to Sit;Sit to Supine      Supine to sit: Mod assist;HOB elevated Sit to supine: Mod assist;HOB elevated   General bed mobility comments: +rail, increased time and effort, verbal cues for sequencing  Transfers Overall transfer level: Needs assistance Equipment used: Rolling walker (2 wheeled) Transfers: Sit to/from Stand Sit to Stand: Mod assist;+2 safety/equipment         General transfer comment: verbal cues for hand placement, assist to power up and stabilize initial standing balance  Ambulation/Gait Ambulation/Gait assistance: +2 safety/equipment;Min assist Ambulation Distance (Feet): 5 Feet Assistive device: Rolling walker (2 wheeled) Gait Pattern/deviations: Step-through pattern;Decreased stride length Gait velocity: decreased Gait velocity interpretation: Below normal speed for age/gender General Gait Details: assist with RW management and to stabilize balance, verbal cues for sequencing  Stairs            Wheelchair Mobility    Modified Rankin (Stroke Patients Only)       Balance Overall balance assessment: Needs assistance Sitting-balance support: No upper extremity supported;Feet supported Sitting balance-Leahy Scale: Good     Standing balance support: Bilateral upper extremity supported;During functional activity Standing balance-Leahy Scale: Poor                               Pertinent Vitals/Pain Pain Assessment: Faces Faces Pain Scale: Hurts a little bit Pain Location: LLE Pain Descriptors / Indicators: Guarding;Grimacing Pain Intervention(s): Monitored during session;Repositioned    Home Living Family/patient expects to be discharged to:: Private residence Living Arrangements: Spouse/significant other;Other relatives;Children(grandson) Available Help at Discharge:  Family;Available 24 hours/day Type of Home: House Home Access: Stairs to enter Entrance Stairs-Rails: Psychiatric nurse of Steps: 2 Home Layout: One level Home  Equipment: Walker - 2 wheels;Cane - single point;Shower seat      Prior Function Level of Independence: Needs assistance   Gait / Transfers Assistance Needed: ambulates independently in the house, no AD. Mostly sedentary  ADL's / Homemaking Assistance Needed: Wife provides simple meal prep. Pt able to dress and bathe modified independent   Comments: Only leaves the house for MD appts. Ambulates with a cane in the community.      Hand Dominance        Extremity/Trunk Assessment   Upper Extremity Assessment Upper Extremity Assessment: Defer to OT evaluation    Lower Extremity Assessment Lower Extremity Assessment: LLE deficits/detail LLE Deficits / Details: expected strength/ROM deficits following surgery LLE: Unable to fully assess due to pain    Cervical / Trunk Assessment Cervical / Trunk Assessment: Kyphotic  Communication   Communication: Expressive difficulties  Cognition Arousal/Alertness: Awake/alert Behavior During Therapy: WFL for tasks assessed/performed Overall Cognitive Status: Impaired/Different from baseline Area of Impairment: Safety/judgement;Problem solving;Orientation;Memory;Following commands                 Orientation Level: Disoriented to;Situation   Memory: Decreased short-term memory Following Commands: Follows one step commands with increased time Safety/Judgement: Decreased awareness of deficits;Decreased awareness of safety   Problem Solving: Slow processing;Difficulty sequencing;Requires verbal cues General Comments: Wife reports pt has increased confusion with hospitalizations but becomes more clear when he returns home to a familiar environment.      General Comments      Exercises     Assessment/Plan    PT Assessment Patient needs continued PT services  PT Problem List Decreased strength;Decreased mobility;Decreased range of motion;Decreased activity tolerance;Decreased balance;Decreased knowledge of use of  DME;Pain;Decreased cognition;Decreased safety awareness       PT Treatment Interventions DME instruction;Therapeutic activities;Cognitive remediation;Gait training;Therapeutic exercise;Patient/family education;Stair training;Balance training;Functional mobility training    PT Goals (Current goals can be found in the Care Plan section)  Acute Rehab PT Goals Patient Stated Goal: home per wife PT Goal Formulation: With patient/family Time For Goal Achievement: 11/23/17 Potential to Achieve Goals: Good    Frequency Min 5X/week   Barriers to discharge        Co-evaluation               AM-PAC PT "6 Clicks" Daily Activity  Outcome Measure Difficulty turning over in bed (including adjusting bedclothes, sheets and blankets)?: A Lot Difficulty moving from lying on back to sitting on the side of the bed? : Unable Difficulty sitting down on and standing up from a chair with arms (e.g., wheelchair, bedside commode, etc,.)?: Unable Help needed moving to and from a bed to chair (including a wheelchair)?: A Little Help needed walking in hospital room?: A Little Help needed climbing 3-5 steps with a railing? : A Lot 6 Click Score: 12    End of Session Equipment Utilized During Treatment: Gait belt Activity Tolerance: Patient tolerated treatment well Patient left: in chair;with call bell/phone within reach;with nursing/sitter in room;with family/visitor present;with chair alarm set Nurse Communication: Mobility status PT Visit Diagnosis: Unsteadiness on feet (R26.81);History of falling (Z91.81);Other abnormalities of gait and mobility (R26.89)    Time: 7371-0626 PT Time Calculation (min) (ACUTE ONLY): 31 min   Charges:   PT Evaluation $PT Eval Moderate Complexity: 1 Mod PT Treatments $Therapeutic Activity: 8-22 mins   PT G Codes:  Lorrin Goodell, PT  Office # [phone] Pager [phone]   Lorriane Shire 11/09/2017, 10:06 AM

## 2017-11-09 NOTE — Care Management Note (Signed)
Case Management Note  Patient Details  Name: [name] MRN: [id number] Date of Birth: 07-16-1926  Subjective/Objective:   84 yr old gentleman s/p fall with left hip fracture. Patient underwent a left hip hemiarthroplasty.                Action/Plan: Case manager spke with patient and his wife concerning discharge plan and DME. Mrs. [name] says her husband will return home, not go to SNF. CM offered choice for [place], referral was called to [name], [facility] Liaison. Patient will have family supoprt at discharge.    Expected Discharge Date:   11/10/17               Expected Discharge Plan:  [place]  In-House Referral:  NA  Discharge planning Services  CM Consult  Post Acute Care Choice:  Home Health Choice offered to:  Spouse, Patient  DME Arranged:  N/A(has all DME at home) DME Agency:  NA  HH Arranged:  PT, OT HH Agency:  [place]  Status of Service:  Completed, signed off  If discussed at [place] of Stay Meetings, dates discussed:    Additional Comments:  Ninfa Meeker, RN 11/09/2017, 10:35 AM

## 2017-11-10 DIAGNOSIS — D62 Acute posthemorrhagic anemia: Secondary | ICD-10-CM

## 2017-11-10 DIAGNOSIS — D696 Thrombocytopenia, unspecified: Secondary | ICD-10-CM

## 2017-11-10 LAB — CBC
HCT: 37.8 % — ABNORMAL LOW (ref 39.0–52.0)
Hemoglobin: 12.4 g/dL — ABNORMAL LOW (ref 13.0–17.0)
MCH: 30.9 pg (ref 26.0–34.0)
MCHC: 32.8 g/dL (ref 30.0–36.0)
MCV: 94.3 fL (ref 78.0–100.0)
PLATELETS: 120 10*3/uL — AB (ref 150–400)
RBC: 4.01 MIL/uL — ABNORMAL LOW (ref 4.22–5.81)
RDW: 13.7 % (ref 11.5–15.5)
WBC: 11.3 10*3/uL — AB (ref 4.0–10.5)

## 2017-11-10 LAB — BASIC METABOLIC PANEL
ANION GAP: 8 (ref 5–15)
BUN: 16 mg/dL (ref 6–20)
CALCIUM: 7.5 mg/dL — AB (ref 8.9–10.3)
CO2: 22 mmol/L (ref 22–32)
CREATININE: 0.95 mg/dL (ref 0.61–1.24)
Chloride: 101 mmol/L (ref 101–111)
GLUCOSE: 101 mg/dL — AB (ref 65–99)
Potassium: 3.9 mmol/L (ref 3.5–5.1)
Sodium: 131 mmol/L — ABNORMAL LOW (ref 135–145)

## 2017-11-10 MED ORDER — SODIUM CHLORIDE 0.9% FLUSH: 3.0000 mL | INTRAVENOUS | Status: DC | PRN

## 2017-11-10 MED ORDER — SODIUM CHLORIDE 0.9 % IV SOLN: 250.0000 mL | INTRAVENOUS | Status: DC | PRN

## 2017-11-10 MED ORDER — SODIUM CHLORIDE 0.9% FLUSH
3.0000 mL | Freq: Two times a day (BID) | INTRAVENOUS | Status: DC
  Administered 2017-11-10 – 2017-11-12 (×5): 3 mL via INTRAVENOUS

## 2017-11-10 MED ORDER — ASPIRIN EC 81 MG PO TBEC
81.0000 mg | DELAYED_RELEASE_TABLET | Freq: Every day | ORAL | Status: DC
  Administered 2017-11-10 – 2017-11-12 (×3): 81 mg via ORAL
  Filled 2017-11-10 (×3): qty 1

## 2017-11-10 MED ORDER — HYDROCODONE-ACETAMINOPHEN 5-325 MG PO TABS
1.0000 | ORAL_TABLET | ORAL | Status: DC | PRN
Start: 2017-11-10 — End: 2017-11-12

## 2017-11-10 NOTE — Progress Notes (Addendum)
Physical Therapy Treatment Patient Details Name: [name] MRN: [id number] DOB: Dec 23, 1925 Today's Date: 11/10/2017    History of Present Illness Pt is a 84 y.o. male, with past medical history significant for coronary artery disease status post angioplasty and stent placement, and dementia.  He lives at home with wife.  He presented status post fall with left hip pain and was found to have a left hip fracture.  He underwent L hip hemiarthroplasty 11-08-17.     PT Comments    Pt's d/c plan updated to SNF for pt safety. At this time pt is not progressing towards goals secondary to lethargy, pain, and confusion. Spoke at length with wife about pt's d/c. Wife agrees she is not in a position to give pt the assist he needs but wishes to take him home as he becomes agitated in unfamiler environments. Wife reports they have a w/c and can use it to transfer pt up 2 steps to enter house. She states her grandson lives next door and would be available to assist. Plan to reassess tomorrow when grandson is present HHPT vs SNF. Considering pt's h/o dementia, home would be the ideal environment for recovery, however safety needs to be the priority.   Follow Up Recommendations  SNF(Pt is an excellent candidate for [facility] program. )     Equipment Recommendations  None recommended by PT    Recommendations for Other Services       Precautions / Restrictions Precautions Precautions: Fall Restrictions Weight Bearing Restrictions: Yes LLE Weight Bearing: Weight bearing as tolerated    Mobility  Bed Mobility Overal bed mobility: Needs Assistance Bed Mobility: Supine to Sit     Supine to sit: Mod assist;HOB elevated;+2 for physical assistance     General bed mobility comments: Pt using rail to elevate trunk. Mod A +2 to progress hips and LEs to EOB and fully elevate trunk.  Transfers Overall transfer level: Needs assistance Equipment used: Rolling walker (2 wheeled) Transfers: Sit  to/from [facility] Sit to Stand: Mod assist;+2 safety/equipment Stand pivot transfers: Mod assist;+2 physical assistance       General transfer comment: Cues for hand placement and assist to power up and stabalize. Frequent cueing for posture throughout transfer.  Ambulation/Gait             General Gait Details: unable to at this time   Stairs            Wheelchair Mobility    Modified Rankin (Stroke Patients Only)       Balance Overall balance assessment: Needs assistance Sitting-balance support: No upper extremity supported;Feet supported Sitting balance-Leahy Scale: Good     Standing balance support: Bilateral upper extremity supported;During functional activity Standing balance-Leahy Scale: Poor                              Cognition Arousal/Alertness: Awake/alert Behavior During Therapy: WFL for tasks assessed/performed Overall Cognitive Status: Impaired/Different from baseline Area of Impairment: Safety/judgement;Problem solving;Memory;Following commands                     Memory: Decreased short-term memory Following Commands: Follows one step commands with increased time Safety/Judgement: Decreased awareness of deficits;Decreased awareness of safety   Problem Solving: Slow processing;Difficulty sequencing;Requires verbal cues General Comments: Wife reports pt has increased confusion with hospitalizations but becomes more clear when he returns home to a familiar environment.  Exercises      General Comments General comments (skin integrity, edema, etc.): Spoke at length with wife present about pt's d/c. Wife agrees she is not in a position to give pt the assist he needs but wishes to take him home as he becomes agitated in unfamiler envoriments.  Wife reports they have a w/c and can use it to transfer pt up 2 steps to enter house. She states her grandson lives next door and would be available to assist.        Pertinent Vitals/Pain Pain Assessment: Faces Faces Pain Scale: Hurts little more Pain Location: LLE Pain Descriptors / Indicators: Guarding;Grimacing Pain Intervention(s): Monitored during session;Limited activity within patient's tolerance;Repositioned    Home Living                      Prior Function            PT Goals (current goals can now be found in the care plan section) Acute Rehab PT Goals Patient Stated Goal: home per wife PT Goal Formulation: With patient/family Time For Goal Achievement: 11/23/17 Potential to Achieve Goals: Good Progress towards PT goals: Not progressing toward goals - comment(limited by lethargy and confusion)    Frequency    Min 5X/week      PT Plan Discharge plan needs to be updated    Co-evaluation              AM-PAC PT "6 Clicks" Daily Activity  Outcome Measure  Difficulty turning over in bed (including adjusting bedclothes, sheets and blankets)?: Unable Difficulty moving from lying on back to sitting on the side of the bed? : Unable Difficulty sitting down on and standing up from a chair with arms (e.g., wheelchair, bedside commode, etc,.)?: Unable Help needed moving to and from a bed to chair (including a wheelchair)?: A Lot Help needed walking in hospital room?: A Lot Help needed climbing 3-5 steps with a railing? : Total 6 Click Score: 8    End of Session Equipment Utilized During Treatment: Gait belt Activity Tolerance: Patient limited by fatigue Patient left: in chair;with call bell/phone within reach;with family/visitor present;with chair alarm set Nurse Communication: Mobility status PT Visit Diagnosis: Unsteadiness on feet (R26.81);History of falling (Z91.81);Other abnormalities of gait and mobility (R26.89)     Time: 1340-1410 PT Time Calculation (min) (ACUTE ONLY): 30 min  Charges:  $Therapeutic Activity: 8-22 mins                    G Codes:       Benjiman Core, [place] Pager [phone] Acute  Rehab   Allena Katz 11/10/2017, 2:27 PM

## 2017-11-10 NOTE — Progress Notes (Signed)
PROGRESS NOTE    [name]   HAL:[id number]  DOB: 04/28/1926  DOA: 11/07/2017 PCP: Charolette Forward, MD   Brief Narrative:  [name] is a 84 y.o. male, with past medical history significant for coronary artery disease status post angioplasty and stent placement, dementia who lives at home with wife presented status post fall with left hip pain and is found to have a left hip fracture. Evaluated by Ortho. Plans for left hip hemiarthroplasty.   Subjective: He has no complaints today. Sitting up eating in bed.     Assessment & Plan:   Active Problems: Left Hip fracture  - per ortho - 3/12 underwent left hip hemiarthroplasty - plan to go home with home health per PT eval - DVT prophylaxis: Lovenox (spoke with wife who is ok with doing Lovenox)  -further ortho recommendations:  Return in 2 weeks for staple removal. Return in 6 weeks to see MD.  Weight Bearing/Load Lower Extremity: full  Hip precautions: none Suture Removal: 10-14 days  Betadine to incision twice daily once dressing is removed on POD#7  H/o CAD, HTN - on Lipitor- Cozaar on hold - Metoprolol held today as BP borderline with noted 2 gm drop in Hb from yesterday - ASA 81 mg on hold- can resume now - no noted chest pain or dyspnea recently by patient of wife- EKG shows NSR  AKI - post op- follow- has coarse crackles at bases-  CXR shows only bilateral atelectasis - holding Cozaar - Cr better today  Grade 1dCHF per ECHO in 1/17 - follow fluid status  Mild acute thrombocytopenia - platelets cont to drop - cont to follow in hospital  Anemia due to acute blood loss - from fracture - Hb dropping from 16 on admission to 12 today- cont to follow in hospital until stable  Mild dementia - wife is a better historian - 3/22: had sitter for behavioral disturbances on 3/21- he is much more calm today and therefore will d/c sitter. - 3/23- remains quite calm   DVT prophylaxis: per ortho Code Status: Full  code Family Communication: with his wife Disposition Plan: f/u HB/ [facility] Consultants:   ortho Procedures:  left hip hemiarthroplasty Antimicrobials:  Anti-infectives (From admission, onward)   Start     Dose/Rate Route Frequency Ordered Stop   11/09/17 0600  vancomycin (VANCOCIN) IVPB 1000 mg/200 mL premix  Status:  Discontinued     1,000 mg 200 mL/hr over 60 Minutes Intravenous On call to O.R. 11/08/17 1806 11/08/17 1836   11/09/17 0400  vancomycin (VANCOCIN) IVPB 1000 mg/200 mL premix     1,000 mg 200 mL/hr over 60 Minutes Intravenous Every 12 hours 11/08/17 1806 11/10/17 0359   11/08/17 1642  vancomycin (VANCOCIN) powder  Status:  Discontinued       As needed 11/08/17 1642 11/08/17 1711   11/08/17 1400  clindamycin (CLEOCIN) IVPB 900 mg     900 mg 100 mL/hr over 30 Minutes Intravenous To Surgery 11/07/17 2241 11/08/17 1548   11/08/17 0600  clindamycin (CLEOCIN) IVPB 900 mg  Status:  Discontinued     900 mg 100 mL/hr over 30 Minutes Intravenous On call to O.R. 11/07/17 2241 11/07/17 2245       Objective: Vitals:   11/10/17 0624 11/10/17 0857 11/10/17 0934 11/10/17 1245  BP: (!) 121/51 133/65 125/70 136/75  Pulse: 91 93 90 97  Resp:  17 18 18   Temp: 98.9 F (37.2 C) 99.6 F (37.6 C) 98.4 F (36.9 C)  99.5 F (37.5 C)  TempSrc: Oral Oral Oral Oral  SpO2: 92% 93% 94% 91%  Weight:      Height:        Intake/Output Summary (Last 24 hours) at 11/10/2017 1402 Last data filed at 11/10/2017 0500 Gross per 24 hour  Intake 690 ml  Output 600 ml  Net 90 ml   Filed Weights   11/07/17 1531 11/08/17 1314  Weight: 79.4 kg (175 lb) 79.4 kg (175 lb)    Examination: General exam: Appears comfortable  HEENT: PERRLA, oral mucosa moist, no sclera icterus or thrush Respiratory system: mild crackles at bases Respiratory effort normal. Cardiovascular system: S1 & S2 heard, RRR.  No murmurs  Gastrointestinal system: Abdomen soft, non-tender, nondistended. Normal bowel sound.  No organomegaly Central nervous system: Alert - confused to situation- No focal neurological deficits. Extremities: No cyanosis, clubbing - dressing on left hip with thigh edema noted Skin: No rashes or ulcers Psychiatry:  Mood & affect appropriate.     Data Reviewed: I have personally reviewed following labs and imaging studies  CBC: Recent Labs  Lab 11/07/17 1722 11/08/17 0820 11/09/17 0600 11/10/17 0727  WBC 9.6 10.8* 12.3* 11.3*  NEUTROABS 7.8*  --   --   --   HGB 16.7 16.1 14.0 12.4*  HCT 51.3 47.2 43.4 37.8*  MCV 97.9 96.1 96.7 94.3  PLT 146* 133* 140* 725*   Basic Metabolic Panel: Recent Labs  Lab 11/07/17 1722 11/08/17 0820 11/09/17 0600 11/10/17 0727  NA 136 136 133* 131*  K 4.3 4.3 3.9 3.9  CL 101 103 101 101  CO2 24 25 21* 22  GLUCOSE 99 133* 161* 101*  BUN 9 12 15 16   CREATININE 0.97 1.12 1.28* 0.95  CALCIUM 8.8* 8.8* 8.1* 7.5*   GFR: Estimated Creatinine Clearance: 53.4 mL/min (by C-G formula based on SCr of 0.95 mg/dL). Liver Function Tests: No results for input(s): AST, ALT, ALKPHOS, BILITOT, PROT, ALBUMIN in the last 168 hours. No results for input(s): LIPASE, AMYLASE in the last 168 hours. No results for input(s): AMMONIA in the last 168 hours. Coagulation Profile: Recent Labs  Lab 11/07/17 1722  INR 0.99   Cardiac Enzymes: No results for input(s): CKTOTAL, CKMB, CKMBINDEX, TROPONINI in the last 168 hours. BNP (last 3 results) No results for input(s): PROBNP in the last 8760 hours. HbA1C: No results for input(s): HGBA1C in the last 72 hours. CBG: No results for input(s): GLUCAP in the last 168 hours. Lipid Profile: No results for input(s): CHOL, HDL, LDLCALC, TRIG, CHOLHDL, LDLDIRECT in the last 72 hours. Thyroid Function Tests: No results for input(s): TSH, T4TOTAL, FREET4, T3FREE, THYROIDAB in the last 72 hours. Anemia Panel: No results for input(s): VITAMINB12, FOLATE, FERRITIN, TIBC, IRON, RETICCTPCT in the last 72 hours. Urine  analysis:    Component Value Date/Time   COLORURINE AMBER (A) 10/17/2015 2311   APPEARANCEUR CLOUDY (A) 10/17/2015 2311   LABSPEC 1.025 10/17/2015 2311   PHURINE 6.0 10/17/2015 2311   GLUCOSEU NEGATIVE 10/17/2015 2311   HGBUR NEGATIVE 10/17/2015 2311   HGBUR trace-lysed 12/30/2009 1201   BILIRUBINUR SMALL (A) 10/17/2015 2311   BILIRUBINUR Neg 05/31/2011 1528   KETONESUR NEGATIVE 10/17/2015 2311   PROTEINUR NEGATIVE 10/17/2015 2311   UROBILINOGEN 2.0 (H) 01/30/2012 1156   NITRITE NEGATIVE 10/17/2015 2311   LEUKOCYTESUR TRACE (A) 10/17/2015 2311   Sepsis Labs: @LABRCNTIP (procalcitonin:4,lacticidven:4) ) Recent Results (from the past 240 hour(s))  Surgical pcr screen     Status: None   Collection Time:  11/07/17 11:10 PM  Result Value Ref Range Status   MRSA, PCR NEGATIVE NEGATIVE Final   Staphylococcus aureus NEGATIVE NEGATIVE Final    Comment: (NOTE) The Xpert SA Assay (FDA approved for NASAL specimens in patients 35 years of age and older), is one component of a comprehensive surveillance program. It is not intended to diagnose infection nor to guide or monitor treatment. Performed at [facility], [place] [address], [place], [place] [address]          Radiology Studies: Dg Pelvis Portable  Result Date: 11/09/2017 CLINICAL DATA:  Hip replacement. EXAM: PORTABLE PELVIS 1-2 VIEWS COMPARISON:  11/08/2017. FINDINGS: Total left hip replacement scratched it left hip replacement. Hardware intact. Anatomic alignment. No acute bony abnormality. IMPRESSION: Left hip replacement.  Anatomic alignment. Electronically Signed   By: Marcello Moores  Register   On: 11/09/2017 09:58   Dg Chest Port 1 View  Result Date: 11/09/2017 CLINICAL DATA:  Bibasilar crackles and hypoxia. EXAM: PORTABLE CHEST 1 VIEW COMPARISON:  11/07/2017. FINDINGS: Poor inspiration. No significant change in bibasilar linear densities. Thoracic spine degenerative changes. Cholecystectomy clips. IMPRESSION: Poor  inspiration with no significant change in linear atelectasis or scarring at both lung bases. Electronically Signed   By: Claudie Revering M.D.   On: 11/09/2017 11:40   Dg C-arm 1-60 Min  Result Date: 11/08/2017 CLINICAL DATA:  Intraoperative radiographs from left hip hemiarthroplasty. EXAM: DG C-ARM 61-120 MIN; OPERATIVE LEFT HIP WITH PELVIS COMPARISON:  11/07/2017 FINDINGS: Three intraoperative radiographs from left hip hemiarthroplasty demonstrate placement of prosthetic femoral head with long stem intramedullary femoral component. No evidence of fractures. Fluoroscopy time is recorded as 20 seconds. IMPRESSION: Intraoperative radiographs from left hip hemiarthroplasty without evidence of immediate complications. Electronically Signed   By: Fidela Salisbury M.D.   On: 11/08/2017 16:54   Dg Hip Operative Unilat W Or W/o Pelvis Left  Result Date: 11/08/2017 CLINICAL DATA:  Intraoperative radiographs from left hip hemiarthroplasty. EXAM: DG C-ARM 61-120 MIN; OPERATIVE LEFT HIP WITH PELVIS COMPARISON:  11/07/2017 FINDINGS: Three intraoperative radiographs from left hip hemiarthroplasty demonstrate placement of prosthetic femoral head with long stem intramedullary femoral component. No evidence of fractures. Fluoroscopy time is recorded as 20 seconds. IMPRESSION: Intraoperative radiographs from left hip hemiarthroplasty without evidence of immediate complications. Electronically Signed   By: Fidela Salisbury M.D.   On: 11/08/2017 16:54      Scheduled Meds: . atorvastatin  40 mg Oral Q2000  . docusate sodium  100 mg Oral BID  . enoxaparin (LOVENOX) injection  40 mg Subcutaneous Q24H  . metoprolol succinate  50 mg Oral Daily  . sodium chloride flush  3 mL Intravenous Q12H  . sodium chloride flush  3 mL Intravenous Q12H  . triamcinolone  2 spray Nasal QPM   Continuous Infusions: . sodium chloride       LOS: 3 days    Time spent in minutes: 35    Debbe Odea, MD [facility] Pager: [email or web address] Password [facility] 11/10/2017, 2:02 PM

## 2017-11-10 NOTE — Progress Notes (Signed)
Dr. Wynelle Cleveland, in to see pt. Verbal order given to hold Tylenol. Pain level per pt 3/10. Metoprolol XL 50mg  held per MD order, due to drop in Hgb, BP 125/70. Notified Harrietta Guardian, RN. See Comment with each medication.

## 2017-11-10 NOTE — Evaluation (Signed)
Occupational Therapy Evaluation Patient Details Name: [name] MRN: [id number] DOB: 1926-03-15 Today's Date: 11/10/2017    History of Present Illness Pt is a 84 y.o. male, with past medical history significant for coronary artery disease status post angioplasty and stent placement, and dementia.  He lives at home with wife.  He presented status post fall with left hip pain and was found to have a left hip fracture.  He underwent L hip hemiarthroplasty 11-08-17.    Clinical Impression   Per wife, pt mod I with BADL PTA. Currently pt requires mod assist +2 for stand pivot transfers and mod-total assist overall for ADL. Wife would like for pt to d/c home due to hx of cognitive deficits, however, at this time feel pt is not safe to go home with wife and family assist at his current functional level. Recommending SNF for follow up to maximize independence and safety with ADL and functional mobility prior to return home. Pending progress may be able to return home with wife and [facility] therapies. Pt would benefit from continued skilled OT to address established goals.    Follow Up Recommendations  SNF;Supervision/Assistance - 24 hour    Equipment Recommendations  None recommended by OT    Recommendations for Other Services       Precautions / Restrictions Precautions Precautions: Fall Restrictions Weight Bearing Restrictions: Yes LLE Weight Bearing: Weight bearing as tolerated      Mobility Bed Mobility Overal bed mobility: Needs Assistance Bed Mobility: Supine to Sit     Supine to sit: Mod assist;HOB elevated;+2 for physical assistance     General bed mobility comments: Pt using rail to elevate trunk. Mod A +2 to progress hips and LEs to EOB and fully elevate trunk.  Transfers Overall transfer level: Needs assistance Equipment used: Rolling walker (2 wheeled) Transfers: Sit to/from [facility] Sit to Stand: Mod assist;+2 safety/equipment Stand pivot transfers:  Mod assist;+2 physical assistance       General transfer comment: Cues for hand placement and assist to power up and stabalize. Frequent cueing for posture throughout transfer.    Balance Overall balance assessment: Needs assistance Sitting-balance support: No upper extremity supported;Feet supported Sitting balance-Leahy Scale: Good     Standing balance support: Bilateral upper extremity supported;During functional activity Standing balance-Leahy Scale: Poor                             ADL either performed or assessed with clinical judgement   ADL Overall ADL's : Needs assistance/impaired Eating/Feeding: Minimal assistance;Sitting   Grooming: Minimal assistance;Sitting   Upper Body Bathing: Moderate assistance;Sitting   Lower Body Bathing: Total assistance;Sit to/from stand;+2 for physical assistance   Upper Body Dressing : Moderate assistance;Sitting   Lower Body Dressing: Total assistance;+2 for physical assistance;Sit to/from stand   Toilet Transfer: Moderate assistance;+2 for physical assistance;Stand-pivot;RW Toilet Transfer Details (indicate cue type and reason): Simulated by stand pivot EOB to chair. Max cues for sequencing and safety         Functional mobility during ADLs: Moderate assistance;+2 for physical assistance;Rolling walker(for stand pivot only)       Vision         Perception     Praxis      Pertinent Vitals/Pain Pain Assessment: Faces Faces Pain Scale: Hurts little more Pain Location: LLE Pain Descriptors / Indicators: Guarding;Grimacing Pain Intervention(s): Monitored during session;Limited activity within patient's tolerance;Repositioned     Hand Dominance  Extremity/Trunk Assessment Upper Extremity Assessment Upper Extremity Assessment: Generalized weakness   Lower Extremity Assessment Lower Extremity Assessment: Defer to PT evaluation   Cervical / Trunk Assessment Cervical / Trunk Assessment: Kyphotic    Communication Communication Communication: Expressive difficulties   Cognition Arousal/Alertness: Awake/alert Behavior During Therapy: WFL for tasks assessed/performed Overall Cognitive Status: Impaired/Different from baseline Area of Impairment: Safety/judgement;Problem solving;Memory;Following commands                     Memory: Decreased short-term memory Following Commands: Follows one step commands with increased time Safety/Judgement: Decreased awareness of deficits;Decreased awareness of safety   Problem Solving: Slow processing;Difficulty sequencing;Requires verbal cues General Comments: Wife reports pt has increased confusion with hospitalizations but becomes more clear when he returns home to a familiar environment.   General Comments  Spoke at length with wife present about pt's d/c. Wife agrees she is not in a position to give pt the assist he needs but wishes to take him home as he becomes agitated in unfamiler envoriments.  Wife reports they have a w/c and can use it to transfer pt up 2 steps to enter house. She states her grandson lives next door and would be available to assist.     Exercises     Shoulder Instructions      Home Living Family/patient expects to be discharged to:: Private residence Living Arrangements: Spouse/significant other;Other relatives;Children Available Help at Discharge: Family;Available 24 hours/day Type of Home: House Home Access: Stairs to enter [employer] of Steps: 2 Entrance Stairs-Rails: Right;Left Home Layout: One level     Bathroom Shower/Tub: Occupational psychologist: Standard     Home Equipment: Environmental consultant - 2 wheels;Cane - single point;Shower seat;Wheelchair - manual;Bedside commode          Prior Functioning/Environment Level of Independence: Needs assistance  Gait / Transfers Assistance Needed: ambulates independently in the house, no AD. Mostly sedentary ADL's / Homemaking Assistance  Needed: Wife provides simple meal prep. Pt able to dress and bathe modified independent             OT Problem List: Decreased strength;Decreased range of motion;Decreased activity tolerance;Impaired balance (sitting and/or standing);Decreased cognition;Decreased safety awareness;Decreased knowledge of use of DME or AE;Decreased knowledge of precautions;Pain;Increased edema      OT Treatment/Interventions: Self-care/ADL training;Energy conservation;DME and/or AE instruction;Therapeutic activities;Patient/family education;Balance training    OT Goals(Current goals can be found in the care plan section) Acute Rehab OT Goals Patient Stated Goal: home per wife OT Goal Formulation: With patient/family Time For Goal Achievement: 11/24/17 Potential to Achieve Goals: Fair ADL Goals Pt Will Perform Upper Body Bathing: with supervision;sitting Pt Will Perform Lower Body Bathing: with min assist;sit to/from stand Pt Will Transfer to Toilet: with min assist;stand pivot transfer;bedside commode Pt Will Perform Toileting - Clothing Manipulation and hygiene: with min assist;sit to/from stand  OT Frequency: Min 2X/week   Barriers to D/C:            Co-evaluation PT/OT/SLP Co-Evaluation/Treatment: Yes Reason for Co-Treatment: Complexity of the patient's impairments (multi-system involvement);Necessary to address cognition/behavior during functional activity;For patient/therapist safety   OT goals addressed during session: Strengthening/ROM      AM-PAC PT "6 Clicks" Daily Activity     Outcome Measure Help from another person eating meals?: A Little Help from another person taking care of personal grooming?: A Little Help from another person toileting, which includes using toliet, bedpan, or urinal?: A Lot Help from another person bathing (including washing,  rinsing, drying)?: A Lot Help from another person to put on and taking off regular upper body clothing?: A Lot Help from another person  to put on and taking off regular lower body clothing?: Total 6 Click Score: 13   End of Session Equipment Utilized During Treatment: Gait belt;Rolling walker  Activity Tolerance: Patient tolerated treatment well Patient left: in chair;with call bell/phone within reach;with chair alarm set;with family/visitor present  OT Visit Diagnosis: Unsteadiness on feet (R26.81);Other abnormalities of gait and mobility (R26.89);Muscle weakness (generalized) (M62.81);History of falling (Z91.81);Pain Pain - Right/Left: Left Pain - part of body: Hip                Time: 1339-1410 OT Time Calculation (min): 31 min Charges:  OT General Charges $OT Visit: 1 Visit OT Evaluation $OT Eval Moderate Complexity: 1 Mod G-Codes:     Takia Runyon A. Ulice Brilliant, M.S., OTR/L Pager: [place] 11/10/2017, 3:26 PM

## 2017-11-11 DIAGNOSIS — S72002D Fracture of unspecified part of neck of left femur, subsequent encounter for closed fracture with routine healing: Secondary | ICD-10-CM

## 2017-11-11 LAB — BASIC METABOLIC PANEL
Anion gap: 8 (ref 5–15)
BUN: 12 mg/dL (ref 6–20)
CHLORIDE: 100 mmol/L — AB (ref 101–111)
CO2: 23 mmol/L (ref 22–32)
CREATININE: 0.88 mg/dL (ref 0.61–1.24)
Calcium: 7.6 mg/dL — ABNORMAL LOW (ref 8.9–10.3)
GFR calc Af Amer: >60 mL/min (ref >60)
Glucose, Bld: 114 mg/dL — ABNORMAL HIGH (ref 65–99)
Potassium: 4.5 mmol/L (ref 3.5–5.1)
SODIUM: 131 mmol/L — AB (ref 135–145)

## 2017-11-11 LAB — CBC
HCT: 37.1 % — ABNORMAL LOW (ref 39.0–52.0)
HEMOGLOBIN: 12.3 g/dL — AB (ref 13.0–17.0)
MCH: 31.3 pg (ref 26.0–34.0)
MCHC: 33.2 g/dL (ref 30.0–36.0)
MCV: 94.4 fL (ref 78.0–100.0)
PLATELETS: 136 10*3/uL — AB (ref 150–400)
RBC: 3.93 MIL/uL — ABNORMAL LOW (ref 4.22–5.81)
RDW: 13.7 % (ref 11.5–15.5)
WBC: 12.8 10*3/uL — ABNORMAL HIGH (ref 4.0–10.5)

## 2017-11-11 MED ORDER — ALUM & MAG HYDROXIDE-SIMETH 200-200-20 MG/5ML PO SUSP: 30.0000 mL | ORAL | 0 refills | Status: AC | PRN

## 2017-11-11 MED ORDER — POLYETHYLENE GLYCOL 3350 17 G PO PACK: 17.0000 g | PACK | Freq: Every day | ORAL | Status: DC | PRN

## 2017-11-11 MED ORDER — POLYETHYLENE GLYCOL 3350 17 G PO PACK: 17.0000 g | PACK | Freq: Every day | ORAL | 0 refills | Status: AC | PRN

## 2017-11-11 NOTE — NC FL2 (Signed)
[place] LEVEL OF CARE SCREENING TOOL     IDENTIFICATION  Patient Name: [name] Birthdate: 28-May-1926 Sex: male Admission Date (Current Location): 11/07/2017  [facility] and [place] Number:  Herbalist and Address:  [facility], [place] [address] [place], [place] [address]      Provider Number: [id number]  Attending Physician Name and Address:  Debbe Odea, MD  Relative Name and Phone Number:       Current Level of Care: Hospital Recommended Level of Care: [place] Prior Approval Number:    Date Approved/Denied:   PASRR Number: [id number]  Discharge Plan: SNF    Current Diagnoses: Patient Active Problem List   Diagnosis Date Noted  . Acute blood loss anemia   . Thrombocytopenia ([place])   . Dementia 11/08/2017  . HTN (hypertension) 11/08/2017  . Hip fracture ([place]) 11/07/2017  . UTI (urinary tract infection) 10/19/2015  . Altered mental status 10/18/2015  . Acute confusional state   . Stroke-like symptoms   . Psoas hematoma, left, secondary to anticoagulant therapy 10/06/2015  . Iliopsoas muscle hematoma 10/06/2015  . Dizziness 09/13/2015  . Essential hypertension 09/13/2015  . Coronary artery disease involving native coronary artery without angina pectoris 09/13/2015  . Dyslipidemia 09/13/2015  . AAA (abdominal aortic aneurysm) without rupture ([place]) 04/13/2014  . Blood in stool 01/29/2014  . Acute coronary syndrome ([place]) 11/06/2013  . History of anesthesia reaction 02/27/2012  . CIS (carcinoma in situ of bladder) 08/03/2011  . Hyponatremia 08/02/2011  . Bladder cancer s/p resection 12/6 07/27/2011  . Hypocalcemia 02/21/2011  . DYSPHAGIA, OROPHARYNGEAL PHASE 07/13/2010  . EDEMA- LOCALIZED 03/22/2010  . PALPITATIONS 03/22/2010  . BENIGN POSITIONAL VERTIGO 10/27/2009  . HYPOTENSION, ORTHOSTATIC 10/27/2009  . TORTICOLLIS 10/27/2009  . HYPERTROPHY PROSTATE W/UR OBST & OTH LUTS 06/03/2009  .  FASTING HYPERGLYCEMIA 11/24/2008  . GERD 10/06/2008  . 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 ACTION TREMOR 08/12/2007  . MEMORY LOSS 08/12/2007  . HYPERLIPIDEMIA 10/07/2006  . HYPERTENSION 10/07/2006  . DIVERTICULOSIS, COLON 10/07/2006  . COLONIC POLYPS 11/03/2005    Orientation RESPIRATION BLADDER Height & Weight     Self, Place, Situation  Normal Continent Weight: 175 lb (79.4 kg) Height:  5\' 10"  (177.8 cm)  BEHAVIORAL SYMPTOMS/MOOD NEUROLOGICAL BOWEL NUTRITION STATUS      Incontinent Diet(Heart healthy, thin liquids)  AMBULATORY STATUS COMMUNICATION OF NEEDS Skin   Limited Assist Verbally Surgical wounds(Closed incision left hip, silicon dressing)                       Personal Care Assistance Level of Assistance  Feeding, Dressing, Bathing Bathing Assistance: Limited assistance Feeding assistance: Independent Dressing Assistance: Limited assistance     Functional Limitations Info  Sight, Hearing, Speech Sight Info: Adequate Hearing Info: Adequate Speech Info: Adequate    SPECIAL CARE FACTORS FREQUENCY  PT (By licensed PT), OT (By licensed OT)     PT Frequency: 5x OT Frequency: 5x            Contractures Contractures Info: Not present    Additional Factors Info  Code Status, Allergies Code Status Info: Full Code Allergies Info: Morphine And Related, Cephalexin           Current Medications (11/11/2017):  This is the current hospital active medication list Current Facility-Administered Medications  Medication Dose Route Frequency Provider Last Rate Last Dose  . 0.9 %  sodium chloride infusion  250  mL Intravenous PRN Debbe Odea, MD      . acetaminophen (TYLENOL) tablet 325-650 mg  325-650 mg Oral Q6H PRN Leandrew Koyanagi, MD   650 mg at 11/09/17 1140  . alum & mag hydroxide-simeth (MAALOX/MYLANTA) 200-200-20 MG/5ML suspension 30 mL  30 mL Oral Q4H PRN Leandrew Koyanagi, MD      . aspirin EC tablet 81 mg  81 mg Oral Daily Rizwan,  Eunice Blase, MD   81 mg at 11/11/17 0830  . atorvastatin (LIPITOR) tablet 40 mg  40 mg Oral Q2000 Leandrew Koyanagi, MD   40 mg at 11/09/17 2028  . docusate sodium (COLACE) capsule 100 mg  100 mg Oral BID Leandrew Koyanagi, MD   100 mg at 11/11/17 0830  . enoxaparin (LOVENOX) injection 40 mg  40 mg Subcutaneous Q24H Leandrew Koyanagi, MD   40 mg at 11/10/17 2305  . HYDROcodone-acetaminophen (NORCO/VICODIN) 5-325 MG per tablet 1 tablet  1 tablet Oral Q4H PRN Debbe Odea, MD      . menthol-cetylpyridinium (CEPACOL) lozenge 3 mg  1 lozenge Oral PRN Leandrew Koyanagi, MD       Or  . phenol (CHLORASEPTIC) mouth spray 1 spray  1 spray Mouth/Throat PRN Leandrew Koyanagi, MD      . metoprolol succinate (TOPROL-XL) 24 hr tablet 50 mg  50 mg Oral Daily Debbe Odea, MD   50 mg at 11/11/17 0830  . nitroGLYCERIN (NITROSTAT) SL tablet 0.4 mg  0.4 mg Sublingual Q5 Min x 3 PRN Leandrew Koyanagi, MD      . ondansetron [facility]) tablet 4 mg  4 mg Oral Q6H PRN Leandrew Koyanagi, MD       Or  . ondansetron [facility]) injection 4 mg  4 mg Intravenous Q6H PRN Leandrew Koyanagi, MD      . ondansetron [facility]) tablet 4 mg  4 mg Oral Q6H PRN Leandrew Koyanagi, MD       Or  . ondansetron [facility]) injection 4 mg  4 mg Intravenous Q6H PRN Leandrew Koyanagi, MD   4 mg at 11/08/17 1845  . sodium chloride flush (NS) 0.9 % injection 3 mL  3 mL Intravenous Q12H Leandrew Koyanagi, MD   3 mL at 11/09/17 2029  . sodium chloride flush (NS) 0.9 % injection 3 mL  3 mL Intravenous PRN Leandrew Koyanagi, MD      . sodium chloride flush (NS) 0.9 % injection 3 mL  3 mL Intravenous Q12H Debbe Odea, MD   3 mL at 11/11/17 0834  . sodium chloride flush (NS) 0.9 % injection 3 mL  3 mL Intravenous PRN Rizwan, Eunice Blase, MD      . triamcinolone (NASACORT) nasal inhaler 2 spray  2 spray Nasal QPM Leandrew Koyanagi, MD         Discharge Medications: Please see discharge summary for a list of discharge medications.  Relevant Imaging Results:  Relevant Lab Results:   Additional  Information SSN: [id number]  Eileen Stanford, LCSW

## 2017-11-11 NOTE — Clinical Social Work Note (Signed)
Per RN pt will go home at d/c and not SNF. Clinical Social Worker will sign off for now as social work intervention is no longer needed. Please consult [place] again if new need arises.   [name] 11/11/2017

## 2017-11-11 NOTE — Care Management (Addendum)
CM called by staff because patient's wife has decided to take him home and is requesting a hospital bed.  When spoke with wife and daughter regarding bed, they relayed that they do not think they can take patient home before a bed is delivered and they do not think patient will participate in [facility] therapy.  The patient's wife and daughter stated that they do not think patient can benefit from further therapy and will not improve over his current condition.  They also state that he will probably stay in the bed all/most of the time.  Spoke with family briefly regarding home hospice services and they were very interested.  Offered home hospice choice to family and they had no preference.  Call placed to [facility] with [facility] who will speak to family regarding hospice services.  Dr. Wynelle Cleveland updated on discussion regarding hospice and placed order for hospital bed along with discharge order.  Family was not expecting to be discharged until tomorrow and state they can accept him at home via ambulance after hospital bed is delivered.  Jermaine with [facility] notified of order for hospital bed.  Update: Pt's wife, [name], spoke with [facility] and Hospice referral is pending.

## 2017-11-11 NOTE — Plan of Care (Signed)
  Problem: Activity: Goal: Risk for activity intolerance will decrease Outcome: Progressing   Problem: Elimination: Goal: Will not experience complications related to bowel motility Outcome: Progressing   Problem: Skin Integrity: Goal: Risk for impaired skin integrity will decrease Outcome: Progressing   

## 2017-11-11 NOTE — Progress Notes (Signed)
[facility]  Received request from [facility] for family interest in hospice services at home after discharge. Spoke with spouse and daughter by phone to explain services, confirm information and gather information. They are requesting Dr. Terrence Dupont for hospice attending. Chart and patient information reviewed by [facility] physician. [facility] referral specialist will follow up with Dr. Terrence Dupont tomorrow decision. Hospice eligibility is pending at this time and may require RN visit in the home after speaking with Dr. Terrence Dupont.   Will follow up with family and RNCM in am.   Please do not hesitate to call with hospice related questions.   Thank you,  Erling Conte, LCSW [phone]

## 2017-11-11 NOTE — Clinical Social Work Note (Signed)
Clinical Social Work Assessment  Patient Details  Name: [name] MRN: [id number] Date of Birth: [birth year]  Date of referral:  11/11/17               Reason for consult:  Facility Placement                Permission sought to share information with:  Family Supports Permission granted to share information::     Name::     [name]  Agency::     Relationship::  Spouse  Contact Information:  [phone]  Housing/Transportation Living arrangements for the past 2 months:  [place] of Information:  Spouse Patient Interpreter Needed:  None Criminal Activity/Legal Involvement Pertinent to Current Situation/Hospitalization:  No - Comment as needed Significant Relationships:  Spouse Lives with:  Spouse Do you feel safe going back to the place where you live?    Need for family participation in patient care:  Yes (Comment)  Care giving concerns:  CSW spoke with pt's spouse. Pt was living at home prior to admission.    Social Worker assessment / plan:  CSW spoke with pt's spouse. Per pt's spouse PT is to come and work with pt and will determine if spouse can take the pt home. [name] really doesn't want pt to go to SNF however, did agree to CSW sending referral to [facility] for location purposes as backup. CSW will follow up with pt's spouse this afternoon.   Employment status:  Retired Forensic scientist:  Medicare PT Recommendations:  [place] / Referral to community resources:  [place]  Patient/Family's Response to care:  [name] verbalized understanding of CSW role and expressed appreciation for support. [name] denies any concern regarding pt care at this time.   Patient/Family's Understanding of and Emotional Response to Diagnosis, Current Treatment, and Prognosis:  [name] understanding and realistic regarding pt's physical limitations. [name] understands the need for pt to go toSNF  placement at d/c if she is unable to care for him at home. [name] doesn't prefer for pt go to SNF however, will determine after pt works with PT.  [name] denies any concern regarding pt's treatment plan at this time. CSW will continue to provide support and facilitate d/c needs.   Emotional Assessment Appearance:  Appears stated age Attitude/Demeanor/Rapport:  Unable to Assess Affect (typically observed):  Unable to Assess Orientation:  Oriented to Place, Oriented to Self, Oriented to Situation Alcohol / Substance use:  Not Applicable Psych involvement (Current and /or in the community):  No (Comment)  Discharge Needs  Concerns to be addressed:  Care Coordination, Basic Needs Readmission within the last 30 days:  No Current discharge risk:  Dependent with Mobility Barriers to Discharge:  Continued Medical Work up   [employer], LCSW 11/11/2017, 10:57 AM

## 2017-11-11 NOTE — Discharge Summary (Addendum)
Physician Discharge Summary  [name] EXB:[id number] DOB: 1925-12-17 DOA: 11/07/2017  PCP: Charolette Forward, MD  Admit date: 11/07/2017 Discharge date: 11/12/2017  Admitted From: home Disposition:  home    Recommendations for Outpatient Follow-up:  1. F/u bmet in CBC in 1wk 2. Cozaar on hold   Home Health: ordered Discharge Condition:  stable   CODE STATUS:  Full code   Consultations:  Ortho     Discharge Diagnoses:  Principal Problem:   Hip fracture ([place]) Active Problems:   Bladder cancer s/p resection 12/6   Coronary artery disease involving native coronary artery without angina pectoris   Dementia   HTN (hypertension)   Acute blood loss anemia   Thrombocytopenia (HCC)    Subjective: He has no complaints. His wife would like to take him home rather than SNF.   Brief Summary: [name] is a84 y.o.male,with past medical history significant for coronary artery disease status post angioplasty and stent placement, dementia who lives at home with wifepresented status post fall with left hip pain and is found to have a left hip fracture. Evaluated by Ortho. Plans for left hip hemiarthroplasty.   Hospital Course:  Active Problems: Left Hip fracture  - per ortho - 3/12 underwent left hip hemiarthroplasty - plan to go home with home health per PT eval- OT recommends SNF vs home with 24 hr supervision- trying to arrange [facility] home first program - DVT prophylaxis: Lovenox (spoke with wife who is ok with doing Lovenox)  -further ortho recommendations:  Return in 2 weeks for staple removal. Return in 6 weeks to see MD.  Weight Bearing/Load Lower Extremity: full  Hip precautions: none Suture Removal: 10-14 days  Betadine to incision twice daily once dressing is removed on POD#7  H/o CAD, HTN - BP has been borderline in setting of acute blood loss- this has now resolved and BP is elevated  - can continue Metoprolol and Cozaar - ASA 81 mg, Lipitor - no noted  chest pain or dyspnea recently by patient of wife- EKG shows NSR  AKI - post op- follow- has coarse crackles at bases-  CXR shows only bilateral atelectasis - Cr improved- can continue Cozaar   Grade 1dCHF per ECHO in 1/17 - compensated   Mild acute thrombocytopenia - platelets dropped to 120 and and are now improving - likely consumption due to acute blood loss  Anemia due to acute blood loss - from fracture - Hb dropping from 16 on admission to 12 on 3/23- continued to follow in hospital - Hb noted to be stable at 12.3 the next day  Mild dementia - wife is a better historian - 3/22: had sitter for behavioral disturbances on 3/21-  Subsequently has remained quite calm  Discharge Exam: Vitals:   11/11/17 1946 11/12/17 0501  BP: (!) 156/70 (!) 160/70  Pulse: 84 95  Resp: 16 16  Temp: 98.2 F (36.8 C) 98.2 F (36.8 C)  SpO2: 94% 95%   Vitals:   11/11/17 0420 11/11/17 1304 11/11/17 1946 11/12/17 0501  BP: (!) 118/59 133/74 (!) 156/70 (!) 160/70  Pulse: 99 87 84 95  Resp:  18 16 16   Temp: 99.1 F (37.3 C) 98.1 F (36.7 C) 98.2 F (36.8 C) 98.2 F (36.8 C)  TempSrc: Axillary Oral Oral Oral  SpO2: 95% 98% 94% 95%  Weight:      Height:        General: Pt is alert, awake, not in acute distress Cardiovascular: RRR, S1/S2 +,  no rubs, no gallops Respiratory: CTA bilaterally, no wheezing, no rhonchi Abdominal: Soft, NT, ND, bowel sounds + Extremities: no edema, no cyanosis   Discharge Instructions  Discharge Instructions    Diet - low sodium heart healthy   Complete by:  As directed    Diet general   Complete by:  As directed    Regular diet   Increase activity slowly   Complete by:  As directed    Increase activity slowly   Complete by:  As directed    Weight bearing as tolerated   Complete by:  As directed      Allergies as of 11/12/2017      Reactions   Morphine And Related Other (See Comments)   Chest pain   Cephalexin Other (See Comments)    Bloody loose stools      Medication List    STOP taking these medications   ciprofloxacin 500 MG tablet Commonly known as:  CIPRO   omeprazole 40 MG capsule Commonly known as:  PRILOSEC     TAKE these medications   acetaminophen 650 MG CR tablet Commonly known as:  TYLENOL 8 HOUR Take 1 tablet (650 mg total) by mouth every 8 (eight) hours as needed for pain.   alum & mag hydroxide-simeth 200-200-20 MG/5ML suspension Commonly known as:  MAALOX/MYLANTA Take 30 mLs by mouth every 4 (four) hours as needed for indigestion.   aspirin EC 81 MG tablet Take 81 mg by mouth daily.   atorvastatin 40 MG tablet Commonly known as:  LIPITOR Take 40 mg by mouth daily.   bisacodyl 5 MG EC tablet Commonly known as:  DULCOLAX Take 1 tablet (5 mg total) by mouth daily as needed for moderate constipation.   CALCIUM PO Take 1 tablet by mouth every evening.   enoxaparin 40 MG/0.4ML injection Commonly known as:  LOVENOX Inject 0.4 mLs (40 mg total) into the skin daily.   losartan 100 MG tablet Commonly known as:  COZAAR Take 50 mg by mouth daily.   metoprolol succinate 50 MG 24 hr tablet Commonly known as:  TOPROL-XL Take 50 mg by mouth daily. Take with or immediately following a meal.   NASACORT ALLERGY 24HR 55 MCG/ACT Aero nasal inhaler Generic drug:  triamcinolone Place 2 sprays into the nose every evening.   nitroGLYCERIN 0.4 MG SL tablet Commonly known as:  NITROSTAT Place 1 tablet (0.4 mg total) under the tongue every 5 (five) minutes x 3 doses as needed for chest pain.   oxyCODONE-acetaminophen 5-325 MG tablet Commonly known as:  PERCOCET Take 1-2 tablets by mouth every 4 (four) hours as needed for severe pain.   polyethylene glycol packet Commonly known as:  MIRALAX / GLYCOLAX Take 17 g by mouth daily as needed for mild constipation.            Durable Medical Equipment  (From admission, onward)        Start     Ordered   11/11/17 1445  For home use only DME  Hospital bed  Once    Question Answer Comment  Patient has (list medical condition): hip fracture-   The above medical condition requires: Patient requires the ability to reposition frequently   Head must be elevated greater than: Other see comments   Bed type Semi-electric      11/11/17 1446       Discharge Care Instructions  (From admission, onward)        Start     Ordered  11/08/17 0000  Weight bearing as tolerated     11/08/17 1645     Follow-up Information    Leandrew Koyanagi, MD In 2 weeks.   Specialty:  Orthopedic Surgery Why:  For suture removal, For wound re-check Contact information: [place] [address] [place], [place] Follow up.   Specialty:  [place] Why:  A representative from [facility] will contact you to arrange start date and time for your therapy.  Contact information: [place] [address] [phone]        Charolette Forward, MD Follow up in 1 week(s).   Specialty:  Cardiology Why:  have him check a Bmet and CBC (blood work) in 1 wk please Contact information: [address] [place] [address] [phone]          Allergies  Allergen Reactions  . Morphine And Related Other (See Comments)    Chest pain   . Cephalexin Other (See Comments)    Bloody loose stools     Procedures/Studies: left hip hemiarthroplasty    Dg Chest 1 View  Result Date: 11/07/2017 CLINICAL DATA:  Fall. EXAM: CHEST  1 VIEW COMPARISON:  Chest x-ray dated October 17, 2015. FINDINGS: The heart size and mediastinal contours are within normal limits. Normal pulmonary vascularity. Atherosclerotic calcification of the aortic arch. Bibasilar atelectasis/scarring. No focal consolidation, pleural effusion, or pneumothorax. No acute osseous abnormality. IMPRESSION: No active disease. Electronically Signed   By: Titus Dubin M.D.    On: 11/07/2017 16:49   Dg Knee 1-2 Views Left  Result Date: 11/07/2017 CLINICAL DATA:  Left knee pain after fall. EXAM: LEFT KNEE - 1-2 VIEW COMPARISON:  None. FINDINGS: No acute fracture or dislocation. No joint effusion. Joint spaces are preserved. Bone mineralization is normal. Vascular calcifications. IMPRESSION: No acute osseous abnormality. Electronically Signed   By: Titus Dubin M.D.   On: 11/07/2017 16:53   Ct Head Wo Contrast  Result Date: 11/07/2017 CLINICAL DATA:  Fall with neck pain EXAM: CT HEAD WITHOUT CONTRAST CT CERVICAL SPINE WITHOUT CONTRAST TECHNIQUE: Multidetector CT imaging of the head and cervical spine was performed following the standard protocol without intravenous contrast. Multiplanar CT image reconstructions of the cervical spine were also generated. COMPARISON:  CTA head/neck 10/18/2015 FINDINGS: CT HEAD FINDINGS Brain: No mass lesion, intraparenchymal hemorrhage or extra-axial collection. No evidence of acute cortical infarct. There is periventricular hypoattenuation compatible with chronic microvascular disease. Vascular: Atherosclerotic calcification of the vertebral and internal carotid arteries at the skull base. Skull: Normal visualized skull base, calvarium and extracranial soft tissues. Sinuses/Orbits: No sinus fluid levels or advanced mucosal thickening. No mastoid effusion. Normal orbits. CT CERVICAL SPINE FINDINGS Alignment: No static subluxation. Facets are aligned. Occipital condyles are normally positioned. Skull base and vertebrae: No acute fracture. Soft tissues and spinal canal: No prevertebral fluid or swelling. No visible canal hematoma. Disc levels: Moderate left C3-4 facet hypertrophy mild-to-moderate foraminal stenosis. Severe left C4-5 facet hypertrophy and severe foraminal stenosis. C5-C6 uncovertebral hypertrophy with moderate-to-severe bilateral foraminal stenosis. No bony spinal canal stenosis. Upper chest: No pneumothorax, pulmonary nodule or  pleural effusion. Other: Normal visualized paraspinal cervical soft tissues. IMPRESSION: 1. Chronic small vessel disease without acute intracranial abnormality. 2. No acute fracture or static subluxation of the cervical spine. 3. Multilevel neural foraminal stenosis due to combination of facet disease and uncovertebral hypertrophy. Electronically Signed   By:  Ulyses Jarred M.D.   On: 11/07/2017 16:43   Ct Cervical Spine Wo Contrast  Result Date: 11/07/2017 CLINICAL DATA:  Fall with neck pain EXAM: CT HEAD WITHOUT CONTRAST CT CERVICAL SPINE WITHOUT CONTRAST TECHNIQUE: Multidetector CT imaging of the head and cervical spine was performed following the standard protocol without intravenous contrast. Multiplanar CT image reconstructions of the cervical spine were also generated. COMPARISON:  CTA head/neck 10/18/2015 FINDINGS: CT HEAD FINDINGS Brain: No mass lesion, intraparenchymal hemorrhage or extra-axial collection. No evidence of acute cortical infarct. There is periventricular hypoattenuation compatible with chronic microvascular disease. Vascular: Atherosclerotic calcification of the vertebral and internal carotid arteries at the skull base. Skull: Normal visualized skull base, calvarium and extracranial soft tissues. Sinuses/Orbits: No sinus fluid levels or advanced mucosal thickening. No mastoid effusion. Normal orbits. CT CERVICAL SPINE FINDINGS Alignment: No static subluxation. Facets are aligned. Occipital condyles are normally positioned. Skull base and vertebrae: No acute fracture. Soft tissues and spinal canal: No prevertebral fluid or swelling. No visible canal hematoma. Disc levels: Moderate left C3-4 facet hypertrophy mild-to-moderate foraminal stenosis. Severe left C4-5 facet hypertrophy and severe foraminal stenosis. C5-C6 uncovertebral hypertrophy with moderate-to-severe bilateral foraminal stenosis. No bony spinal canal stenosis. Upper chest: No pneumothorax, pulmonary nodule or pleural  effusion. Other: Normal visualized paraspinal cervical soft tissues. IMPRESSION: 1. Chronic small vessel disease without acute intracranial abnormality. 2. No acute fracture or static subluxation of the cervical spine. 3. Multilevel neural foraminal stenosis due to combination of facet disease and uncovertebral hypertrophy. Electronically Signed   By: Ulyses Jarred M.D.   On: 11/07/2017 16:43   Dg Pelvis Portable  Result Date: 11/09/2017 CLINICAL DATA:  Hip replacement. EXAM: PORTABLE PELVIS 1-2 VIEWS COMPARISON:  11/08/2017. FINDINGS: Total left hip replacement scratched it left hip replacement. Hardware intact. Anatomic alignment. No acute bony abnormality. IMPRESSION: Left hip replacement.  Anatomic alignment. Electronically Signed   By: Marcello Moores  Register   On: 11/09/2017 09:58   Dg Chest Port 1 View  Result Date: 11/09/2017 CLINICAL DATA:  Bibasilar crackles and hypoxia. EXAM: PORTABLE CHEST 1 VIEW COMPARISON:  11/07/2017. FINDINGS: Poor inspiration. No significant change in bibasilar linear densities. Thoracic spine degenerative changes. Cholecystectomy clips. IMPRESSION: Poor inspiration with no significant change in linear atelectasis or scarring at both lung bases. Electronically Signed   By: Claudie Revering M.D.   On: 11/09/2017 11:40   Dg C-arm 1-60 Min  Result Date: 11/08/2017 CLINICAL DATA:  Intraoperative radiographs from left hip hemiarthroplasty. EXAM: DG C-ARM 61-120 MIN; OPERATIVE LEFT HIP WITH PELVIS COMPARISON:  11/07/2017 FINDINGS: Three intraoperative radiographs from left hip hemiarthroplasty demonstrate placement of prosthetic femoral head with long stem intramedullary femoral component. No evidence of fractures. Fluoroscopy time is recorded as 20 seconds. IMPRESSION: Intraoperative radiographs from left hip hemiarthroplasty without evidence of immediate complications. Electronically Signed   By: Fidela Salisbury M.D.   On: 11/08/2017 16:54   Dg Hip Operative Unilat W Or W/o  Pelvis Left  Result Date: 11/08/2017 CLINICAL DATA:  Intraoperative radiographs from left hip hemiarthroplasty. EXAM: DG C-ARM 61-120 MIN; OPERATIVE LEFT HIP WITH PELVIS COMPARISON:  11/07/2017 FINDINGS: Three intraoperative radiographs from left hip hemiarthroplasty demonstrate placement of prosthetic femoral head with long stem intramedullary femoral component. No evidence of fractures. Fluoroscopy time is recorded as 20 seconds. IMPRESSION: Intraoperative radiographs from left hip hemiarthroplasty without evidence of immediate complications. Electronically Signed   By: Fidela Salisbury M.D.   On: 11/08/2017 16:54   Dg Hip Unilat W Or Wo Pelvis 2-3 Views  Left  Result Date: 11/07/2017 CLINICAL DATA:  Left hip pain after fall. EXAM: DG HIP (WITH OR WITHOUT PELVIS) 2-3V LEFT COMPARISON:  None. FINDINGS: Acute transcervical versus basicervical fracture of the left femoral neck with external rotation. No dislocation. Increased sclerosis within both femoral heads. No subchondral collapse. The bones are osteopenic. The pubic symphysis and sacroiliac joints are intact. Soft tissues are unremarkable. IMPRESSION: 1. Acute left femoral neck fracture.  No dislocation. 2. 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 Electronically Signed   By: Titus Dubin M.D.   On: 11/07/2017 16:52     The results of significant diagnostics from this hospitalization (including imaging, microbiology, ancillary and laboratory) are listed below for reference.     Microbiology: Recent Results (from the past 240 hour(s))  Surgical pcr screen     Status: None   Collection Time: 11/07/17 11:10 PM  Result Value Ref Range Status   MRSA, PCR NEGATIVE NEGATIVE Final   Staphylococcus aureus NEGATIVE NEGATIVE Final    Comment: (NOTE) The Xpert SA Assay (FDA approved for NASAL specimens in patients 69 years of age and older), is one component of a comprehensive surveillance program. It is not intended to diagnose  infection nor to guide or monitor treatment. Performed at [facility], [place] [address] [place], [place] [address]      Labs: BNP (last 3 results) No results for input(s): BNP in the last 8760 hours. Basic Metabolic Panel: Recent Labs  Lab 11/07/17 1722 11/08/17 0820 11/09/17 0600 11/10/17 0727 11/11/17 0647  NA 136 136 133* 131* 131*  K 4.3 4.3 3.9 3.9 4.5  CL 101 103 101 101 100*  CO2 24 25 21* 22 23  GLUCOSE 99 133* 161* 101* 114*  BUN 9 12 15 16 12   CREATININE 0.97 1.12 1.28* 0.95 0.88  CALCIUM 8.8* 8.8* 8.1* 7.5* 7.6*   Liver Function Tests: No results for input(s): AST, ALT, ALKPHOS, BILITOT, PROT, ALBUMIN in the last 168 hours. No results for input(s): LIPASE, AMYLASE in the last 168 hours. No results for input(s): AMMONIA in the last 168 hours. CBC: Recent Labs  Lab 11/07/17 1722 11/08/17 0820 11/09/17 0600 11/10/17 0727 11/11/17 0647  WBC 9.6 10.8* 12.3* 11.3* 12.8*  NEUTROABS 7.8*  --   --   --   --   HGB 16.7 16.1 14.0 12.4* 12.3*  HCT 51.3 47.2 43.4 37.8* 37.1*  MCV 97.9 96.1 96.7 94.3 94.4  PLT 146* 133* 140* 120* 136*   Cardiac Enzymes: No results for input(s): CKTOTAL, CKMB, CKMBINDEX, TROPONINI in the last 168 hours. BNP: Invalid input(s): POCBNP CBG: No results for input(s): GLUCAP in the last 168 hours. D-Dimer No results for input(s): DDIMER in the last 72 hours. Hgb A1c No results for input(s): HGBA1C in the last 72 hours. Lipid Profile No results for input(s): CHOL, HDL, LDLCALC, TRIG, CHOLHDL, LDLDIRECT in the last 72 hours. Thyroid function studies No results for input(s): TSH, T4TOTAL, T3FREE, THYROIDAB in the last 72 hours.  Invalid input(s): FREET3 Anemia work up No results for input(s): VITAMINB12, FOLATE, FERRITIN, TIBC, IRON, RETICCTPCT in the last 72 hours. Urinalysis    Component Value Date/Time   COLORURINE AMBER (A) 10/17/2015 2311   APPEARANCEUR CLOUDY (A) 10/17/2015 2311   LABSPEC 1.025 10/17/2015 2311    PHURINE 6.0 10/17/2015 2311   GLUCOSEU NEGATIVE 10/17/2015 2311   HGBUR NEGATIVE 10/17/2015 2311   HGBUR trace-lysed 12/30/2009 1201   BILIRUBINUR SMALL (A) 10/17/2015 2311   BILIRUBINUR Neg  05/31/2011 [employer] 10/17/2015 2311   PROTEINUR NEGATIVE 10/17/2015 2311   UROBILINOGEN 2.0 (H) 01/30/2012 1156   NITRITE NEGATIVE 10/17/2015 2311   LEUKOCYTESUR TRACE (A) 10/17/2015 2311   Sepsis Labs Invalid input(s): PROCALCITONIN,  WBC,  LACTICIDVEN Microbiology Recent Results (from the past 240 hour(s))  Surgical pcr screen     Status: None   Collection Time: 11/07/17 11:10 PM  Result Value Ref Range Status   MRSA, PCR NEGATIVE NEGATIVE Final   Staphylococcus aureus NEGATIVE NEGATIVE Final    Comment: (NOTE) The Xpert SA Assay (FDA approved for NASAL specimens in patients 80 years of age and older), is one component of a comprehensive surveillance program. It is not intended to diagnose infection nor to guide or monitor treatment. Performed at [facility], [place] [address], [place], [place] [address]      Time coordinating discharge: Over 30 minutes  SIGNED:   Debbe Odea, MD  [facility] 11/12/2017, 9:24 AM Pager   If 7PM-7AM, please contact night-coverage [email or web address] Password [email or web address]

## 2017-11-11 NOTE — Progress Notes (Signed)
Pt's wife stated that she already decided that she wants Pt to go home and not in a facility. She said that if they can get a hospital bed, they can put Pt in the living room and they will be able to care for him.

## 2017-11-11 NOTE — Plan of Care (Signed)
  Problem: Clinical Measurements: Goal: Postoperative complications will be avoided or minimized Outcome: Progressing   Problem: Skin Integrity: Goal: Risk for impaired skin integrity will decrease Outcome: Progressing

## 2017-11-11 NOTE — Progress Notes (Signed)
Clinical Social Work received phone call from MD stating that family will be needing assistance with discharge plans. CSW reached out RNCM on the floor to see if patient would qualify for [facility]. RNCM stated patient would not qualify for [facility] because patient is not a [facility] patient.  CSW reached out to CSW on the floor to make her aware of patient needing assistance with discharge plan.   Rhea Pink, MSW,  [place]

## 2017-11-11 NOTE — Progress Notes (Signed)
Physical Therapy Treatment Patient Details Name: [name] MRN: [id number] DOB: 1926/03/27 Today's Date: 11/11/2017    History of Present Illness Pt is a 84 y.o. male, with past medical history significant for coronary artery disease status post angioplasty and stent placement, and dementia.  He lives at home with wife.  He presented status post fall with left hip pain and was found to have a left hip fracture.  He underwent L hip hemiarthroplasty 11-08-17.     PT Comments    Pt required mod/max A +2 for all mobility. Stedy used for transfers this session. Current plan remains appropriate.    Follow Up Recommendations  SNF(Pt is an excellent candidate for [facility] program. )     Equipment Recommendations  None recommended by PT    Recommendations for Other Services       Precautions / Restrictions Precautions Precautions: Fall Restrictions Weight Bearing Restrictions: Yes LLE Weight Bearing: Weight bearing as tolerated    Mobility  Bed Mobility Overal bed mobility: Needs Assistance Bed Mobility: Sit to Supine       Sit to supine: Max assist;+2 for physical assistance   General bed mobility comments: pt OOB in chair upon arrival; +2 assist to bring trunk and bilat LE into supine  Transfers Overall transfer level: Needs assistance   Transfers: Sit to/from Stand Sit to Stand: Mod assist;+2 safety/equipment         General transfer comment: +2 to come into upright standing with use of bed pad to bring hips forward; cues for sequencing  Ambulation/Gait             General Gait Details: unable to at this time   Stairs            Wheelchair Mobility    Modified Rankin (Stroke Patients Only)       Balance Overall balance assessment: Needs assistance Sitting-balance support: No upper extremity supported;Feet supported Sitting balance-Leahy Scale: Good     Standing balance support: Bilateral upper extremity supported;During functional  activity Standing balance-Leahy Scale: Poor                              Cognition Arousal/Alertness: Awake/alert Behavior During Therapy: WFL for tasks assessed/performed Overall Cognitive Status: History of cognitive impairments - at baseline(dementia at baseline)                                 General Comments: pt communicated very little with therapist and therapy tech this session however followed one step commands with increased time and multimodal cues; pt's wife reported increased confusion in hospital and decreased participation when not in familiar environment      Exercises      General Comments        Pertinent Vitals/Pain Pain Assessment: Faces Faces Pain Scale: Hurts little more Pain Location: LLE Pain Descriptors / Indicators: Guarding;Grimacing Pain Intervention(s): Limited activity within patient's tolerance;Monitored during session;Repositioned    Home Living                      Prior Function            PT Goals (current goals can now be found in the care plan section) Acute Rehab PT Goals Patient Stated Goal: home per wife PT Goal Formulation: With patient/family Time For Goal Achievement: 11/23/17 Potential to Achieve Goals:  Good Progress towards PT goals: Progressing toward goals    Frequency    Min 5X/week      PT Plan Current plan remains appropriate    Co-evaluation              AM-PAC PT "6 Clicks" Daily Activity  Outcome Measure  Difficulty turning over in bed (including adjusting bedclothes, sheets and blankets)?: Unable Difficulty moving from lying on back to sitting on the side of the bed? : Unable Difficulty sitting down on and standing up from a chair with arms (e.g., wheelchair, bedside commode, etc,.)?: Unable Help needed moving to and from a bed to chair (including a wheelchair)?: A Lot Help needed walking in hospital room?: Total Help needed climbing 3-5 steps with a railing? :  Total 6 Click Score: 7    End of Session Equipment Utilized During Treatment: Gait belt Activity Tolerance: Patient tolerated treatment well Patient left: with call bell/phone within reach;with family/visitor present;in bed;with SCD's reapplied Nurse Communication: Mobility status PT Visit Diagnosis: Unsteadiness on feet (R26.81);History of falling (Z91.81);Other abnormalities of gait and mobility (R26.89)     Time: 0630-1601 PT Time Calculation (min) (ACUTE ONLY): 31 min  Charges:  $Therapeutic Activity: 23-37 mins                    G Codes:       Earney Navy, PTA Pager: [phone]     Darliss Cheney 11/11/2017, 4:20 PM

## 2017-11-11 NOTE — Clinical Social Work Note (Signed)
[name] wants to observe pt working with PT before she commits to taking him home. [name] prefers to take pt home and really does not want him going to SNF. However, pt's spouse agrees if she can not take him home he will have to go to SNF. CSW will follow up with pt's spouse after PT evaluates pt.   [place], [place]

## 2017-11-11 NOTE — Progress Notes (Signed)
Pt's daughter stated that hospital bed will be delivered tomorrow at their house and they will not be able to care for Pt until then. MD notified.

## 2017-11-12 DIAGNOSIS — Z96649 Presence of unspecified artificial hip joint: Secondary | ICD-10-CM

## 2017-11-12 NOTE — Progress Notes (Signed)
Occupational Therapy Treatment Patient Details Name: [name] MRN: [id number] DOB: 03/18/1926 Today's Date: 11/12/2017    History of present illness Pt is a 84 y.o. male, with past medical history significant for coronary artery disease status post angioplasty and stent placement, and dementia.  He lives at home with wife.  He presented status post fall with left hip pain and was found to have a left hip fracture.  He underwent L hip hemiarthroplasty 11-08-17.    OT comments  Pt with no family present this session. Recommend hoyer lift and hospital bed for d/c planning if home. Pt requires total+2 (A) with progressive (A) from Mod -Max (A) due to fatigue during session. Pt with noticeable redness on buttock so pink dressing placed and patient placed on R side lying at this time with pillow between knees hob elevated. Pt could benefit from air mattress overlay and frequent positional changes to prevent skin break down   Follow Up Recommendations  SNF;Supervision/Assistance - 24 hour    Equipment Recommendations  Hospital bed;Other (comment)(hoyer lift )    Recommendations for Other Services      Precautions / Restrictions Precautions Precautions: Fall Restrictions Weight Bearing Restrictions: No LLE Weight Bearing: Weight bearing as tolerated       Mobility Bed Mobility Overal bed mobility: Needs Assistance Bed Mobility: Sit to Supine;Supine to Sit     Supine to sit: Max assist;+2 for physical assistance;HOB elevated Sit to supine: Max assist;+2 for physical assistance   General bed mobility comments: multimodal cues for sequencing; use of bed pad and HOB elevated; assist with all aspects of bed mobility  Transfers Overall transfer level: Needs assistance Equipment used: Rolling walker (2 wheeled);2 person hand held assist Transfers: Sit to/from [facility] Sit to Stand: Mod assist;Max assist;From elevated surface;+2 physical assistance Stand pivot  transfers: Max assist;+2 physical assistance       General transfer comment: multiple sit to stands from EOB and BSC; multimodal cues for safe hand placement and sequencing; assist to power up into standing and to maintain standing; pt with flexed posture during all transfers and stand pivots without RW due to pt's difficulty with using AD for dynamic activity     Balance Overall balance assessment: Needs assistance Sitting-balance support: Feet supported;Bilateral upper extremity supported Sitting balance-Leahy Scale: Fair     Standing balance support: Bilateral upper extremity supported;During functional activity Standing balance-Leahy Scale: Poor                             ADL either performed or assessed with clinical judgement   ADL Overall ADL's : Needs assistance/impaired Eating/Feeding: Set up;Bed level Eating/Feeding Details (indicate cue type and reason): eating breakfast on arrival pt eating 75% of meal. pt with large amount of pancakes on tray Grooming: Wash/dry hands;Wash/dry face;Min guard;Sitting               Lower Body Dressing: Total assistance Lower Body Dressing Details (indicate cue type and reason): don socks Toilet Transfer: +2 for physical assistance;Moderate assistance;Maximal assistance;+2 for safety/equipment;Stand-pivot;BSC Toilet Transfer Details (indicate cue type and reason): pt transfered from bed to 3n1 Toileting- Clothing Manipulation and Hygiene: Total assistance Toileting - Clothing Manipulation Details (indicate cue type and reason): pt total (A) for peri care with total +2 (A) for standing       General ADL Comments: pt completed bed mobility, toilet transfer and voiding loose stool     Vision  Perception     Praxis      Cognition Arousal/Alertness: Awake/alert Behavior During Therapy: Flat affect Overall Cognitive Status: History of cognitive impairments - at baseline                                  General Comments: Pt verbalized "i need to go to the bathroom" after therapist wiping stool from buttock ( unaware of some incontinence during session). pt sitting prolonged time to void. pt pulling tissues from box and wiping nose over and over again. Tissues do not appear to be wet at this time        Exercises     Shoulder Instructions       General Comments      Pertinent Vitals/ Pain       Pain Assessment: Faces Faces Pain Scale: Hurts a little bit Pain Location: LLE Pain Descriptors / Indicators: Guarding;Grimacing Pain Intervention(s): Monitored during session;Premedicated before session;Repositioned  Home Living                                          Prior Functioning/Environment              Frequency  Min 2X/week        Progress Toward Goals  OT Goals(current goals can now be found in the care plan section)  Progress towards OT goals: Progressing toward goals  Acute Rehab OT Goals Patient Stated Goal: home per wife OT Goal Formulation: With patient/family Time For Goal Achievement: 11/24/17 ADL Goals Pt Will Perform Upper Body Bathing: with supervision;sitting Pt Will Perform Lower Body Bathing: with min assist;sit to/from stand Pt Will Transfer to Toilet: with min assist;stand pivot transfer;bedside commode Pt Will Perform Toileting - Clothing Manipulation and hygiene: with min assist;sit to/from stand  Plan Discharge plan remains appropriate    Co-evaluation    PT/OT/SLP Co-Evaluation/Treatment: Yes Reason for Co-Treatment: Complexity of the patient's impairments (multi-system involvement);Necessary to address cognition/behavior during functional activity;For patient/therapist safety;To address functional/ADL transfers   OT goals addressed during session: ADL's and self-care;Proper use of Adaptive equipment and DME;Strengthening/ROM      AM-PAC PT "6 Clicks" Daily Activity     Outcome Measure   Help from another  person eating meals?: A Little Help from another person taking care of personal grooming?: A Little Help from another person toileting, which includes using toliet, bedpan, or urinal?: A Lot Help from another person bathing (including washing, rinsing, drying)?: A Lot Help from another person to put on and taking off regular upper body clothing?: A Lot Help from another person to put on and taking off regular lower body clothing?: Total 6 Click Score: 13    End of Session Equipment Utilized During Treatment: Gait belt;Rolling walker  OT Visit Diagnosis: Unsteadiness on feet (R26.81);Other abnormalities of gait and mobility (R26.89);Muscle weakness (generalized) (M62.81);History of falling (Z91.81);Pain Pain - Right/Left: Left Pain - part of body: Hip   Activity Tolerance Patient tolerated treatment well   Patient Left in bed;with call bell/phone within reach;with bed alarm set;with nursing/sitter in room(tech AJ present during session)   Nurse Communication Mobility status;Precautions        Time: 0737-1062 OT Time Calculation (min): 42 min  Charges: OT General Charges $OT Visit: 1 Visit OT Treatments $Self Care/Home Management : 23-37 mins   Ronnald Ramp,  [place]   OTR/L Pager: [phone] Office: [phone] .    Parke Poisson B 11/12/2017, 2:32 PM

## 2017-11-12 NOTE — Progress Notes (Addendum)
[facility]  The patient is stable for d/c home since yesterday. The wife and I have spoken daily about the possibility of discharge the next day. The plan was with HHPT [facility] home first program). He stayed overnight as he did not have a hospital bed. His wife later spoke with social worker about hospice and states she would be interested in this. Unfortunately home health and hospice cannot be in place at the same time. Plan for now is home health. He remains stable for d/c today. Recommend close f/u with PCP Dr Terrence Dupont.   Debbe Odea, MD

## 2017-11-12 NOTE — Discharge Instructions (Signed)
° ° °  ° ° °  1. Change dressings as needed 2. May shower but keep incisions covered and dry 3. Take lovenox to prevent blood clots 4. Take stool softeners as needed 5. Take pain meds as needed  Please take all your medications with you for your next visit with your Primary MD. Please request your Primary MD to go over all hospital test results at the follow up. Please ask your Primary MD to get all Hospital records sent to his/her office.  If you experience worsening of your admission symptoms, develop shortness of breath, chest pain, suicidal or homicidal thoughts or a life threatening emergency, you must seek medical attention immediately by calling 911 or calling your MD.  Dennis Bast must read the complete instructions/literature along with all the possible adverse reactions/side effects for all the medicines you take including new medications that have been prescribed to you. Take new medicines after you have completely understood and accpet all the possible adverse reactions/side effects.   Do not drive when taking pain medications or sedatives.    Do not take more than prescribed Pain, Sleep and Anxiety Medications  If you have smoked or chewed Tobacco in the last 2 yrs please stop. Stop any regular alcohol and or recreational drug use.  Wear Seat belts while driving.

## 2017-11-12 NOTE — Progress Notes (Signed)
Physical Therapy Treatment Patient Details Name: [name] MRN: [id number] DOB: 1925/09/22 Today's Date: 11/12/2017    History of Present Illness Pt is a 84 y.o. male, with past medical history significant for coronary artery disease status post angioplasty and stent placement, and dementia.  He lives at home with wife.  He presented status post fall with left hip pain and was found to have a left hip fracture.  He underwent L hip hemiarthroplasty 11-08-17.     PT Comments    Patient required mod/max A +2 for functional transfers and total A for pericare. Pt demonstrated decreased activity tolerance and able to stand briefly with +2 assist to maintain standing. No family present during session. If pt is to go home with Hospice care family will need hoyer lift for safe OOB transfers. Continue to progress as tolerated.    Follow Up Recommendations  SNF     Equipment Recommendations  Other (comment)(hoyer lift if pt is going home with hospice care )    Recommendations for Other Services       Precautions / Restrictions Precautions Precautions: Fall Restrictions Weight Bearing Restrictions: Yes LLE Weight Bearing: Weight bearing as tolerated    Mobility  Bed Mobility Overal bed mobility: Needs Assistance Bed Mobility: Sit to Supine;Supine to Sit     Supine to sit: Max assist;+2 for physical assistance;HOB elevated Sit to supine: Max assist;+2 for physical assistance   General bed mobility comments: multimodal cues for sequencing; use of bed pad and HOB elevated; assist with all aspects of bed mobility  Transfers Overall transfer level: Needs assistance Equipment used: Rolling walker (2 wheeled);2 person hand held assist Transfers: Sit to/from [facility] Sit to Stand: Mod assist;Max assist;From elevated surface;+2 physical assistance Stand pivot transfers: Max assist;+2 physical assistance       General transfer comment: multiple sit to stands from EOB  and BSC; multimodal cues for safe hand placement and sequencing; assist to power up into standing and to maintain standing; pt with flexed posture during all transfers and stand pivots without RW due to pt's difficulty with using AD for dynamic activity   Ambulation/Gait             General Gait Details: pt attempted to take steps forward from EOB however pt too fatigued and incontinent of stool so returned to sitting EOB   Stairs            Wheelchair Mobility    Modified Rankin (Stroke Patients Only)       Balance Overall balance assessment: Needs assistance Sitting-balance support: Feet supported;Bilateral upper extremity supported Sitting balance-Leahy Scale: Fair     Standing balance support: Bilateral upper extremity supported;During functional activity Standing balance-Leahy Scale: Poor                              Cognition Arousal/Alertness: Awake/alert Behavior During Therapy: WFL for tasks assessed/performed Overall Cognitive Status: History of cognitive impairments - at baseline(dementia at baseline)                                        Exercises      General Comments General comments (skin integrity, edema, etc.): erythema on pt's buttocks and sacral dressing placed; NT assisted with pericare       Pertinent Vitals/Pain Pain Assessment: Faces Faces Pain Scale: No hurt  Pain Intervention(s): Monitored during session    Home Living                      Prior Function            PT Goals (current goals can now be found in the care plan section) Acute Rehab PT Goals PT Goal Formulation: With patient/family Time For Goal Achievement: 11/23/17 Potential to Achieve Goals: Good Progress towards PT goals: Progressing toward goals    Frequency    Min 5X/week      PT Plan Equipment recommendations need to be updated    Co-evaluation PT/OT/SLP Co-Evaluation/Treatment: Yes Reason for Co-Treatment:  For patient/therapist safety;To address functional/ADL transfers PT goals addressed during session: Mobility/safety with mobility        AM-PAC PT "6 Clicks" Daily Activity  Outcome Measure  Difficulty turning over in bed (including adjusting bedclothes, sheets and blankets)?: Unable Difficulty moving from lying on back to sitting on the side of the bed? : Unable Difficulty sitting down on and standing up from a chair with arms (e.g., wheelchair, bedside commode, etc,.)?: Unable Help needed moving to and from a bed to chair (including a wheelchair)?: A Lot Help needed walking in hospital room?: Total Help needed climbing 3-5 steps with a railing? : Total 6 Click Score: 7    End of Session Equipment Utilized During Treatment: Gait belt Activity Tolerance: Patient limited by fatigue Patient left: with call bell/phone within reach;in bed;with SCD's reapplied Nurse Communication: Mobility status PT Visit Diagnosis: Unsteadiness on feet (R26.81);History of falling (Z91.81);Other abnormalities of gait and mobility (R26.89)     Time: 0355-9741 PT Time Calculation (min) (ACUTE ONLY): 40 min  Charges:  $Therapeutic Activity: 8-22 mins                    G Codes:       Earney Navy, PTA Pager: [phone]     Darliss Cheney 11/12/2017, 10:25 AM

## 2017-11-12 NOTE — Progress Notes (Signed)
[facility])  Followed up with patient, spouse and daughter at bedside. All in agreement with home with home health. They have [facility] contact information if hospice services are needed in the future. Updated RNCM.  Thank you,  Erling Conte, LCSW [phone]

## 2017-11-12 NOTE — Care Management Important Message (Signed)
Important Message  Patient Details  Name: [name] MRN: [id number] Date of Birth: 02/19/1926   Medicare Important Message Given:  Yes    [name] 11/12/2017, 2:50 PM

## 2017-11-19 DEATH — deceased
